# Patient Record
Sex: Female | Born: 1990 | Race: Black or African American | Hispanic: No | Marital: Single | Smoking: Current every day smoker
Health system: Southern US, Community
[De-identification: ages and names within clinical notes are randomized; demographics above are authoritative.]

## PROBLEM LIST (undated history)

## (undated) ENCOUNTER — Inpatient Hospital Stay (HOSPITAL_COMMUNITY): Payer: Self-pay

## (undated) ENCOUNTER — Ambulatory Visit: Payer: Medicaid Other

## (undated) DIAGNOSIS — O1490 Unspecified pre-eclampsia, unspecified trimester: Secondary | ICD-10-CM

## (undated) DIAGNOSIS — I10 Essential (primary) hypertension: Secondary | ICD-10-CM

## (undated) DIAGNOSIS — E119 Type 2 diabetes mellitus without complications: Secondary | ICD-10-CM

## (undated) DIAGNOSIS — O1410 Severe pre-eclampsia, unspecified trimester: Secondary | ICD-10-CM

## (undated) DIAGNOSIS — R7303 Prediabetes: Secondary | ICD-10-CM

## (undated) DIAGNOSIS — O24419 Gestational diabetes mellitus in pregnancy, unspecified control: Secondary | ICD-10-CM

## (undated) DIAGNOSIS — K3580 Unspecified acute appendicitis: Secondary | ICD-10-CM

## (undated) DIAGNOSIS — E669 Obesity, unspecified: Secondary | ICD-10-CM

## (undated) HISTORY — DX: Gestational diabetes mellitus in pregnancy, unspecified control: O24.419

## (undated) HISTORY — DX: Severe pre-eclampsia, unspecified trimester: O14.10

## (undated) HISTORY — DX: Type 2 diabetes mellitus without complications: E11.9

---
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
## 2011-04-03 ENCOUNTER — Emergency Department (HOSPITAL_COMMUNITY)
Admission: EM | Admit: 2011-04-03 | Discharge: 2011-04-03 | Disposition: A | Discharge status: Home / Self Care | Payer: Medicaid Other | Attending: Emergency Medicine | Admitting: Emergency Medicine

## 2011-04-03 ENCOUNTER — Encounter (HOSPITAL_COMMUNITY): Payer: Self-pay | Admitting: Emergency Medicine

## 2011-04-03 DIAGNOSIS — M79609 Pain in unspecified limb: Secondary | ICD-10-CM | POA: Insufficient documentation

## 2011-04-03 DIAGNOSIS — S40269A Insect bite (nonvenomous) of unspecified shoulder, initial encounter: Secondary | ICD-10-CM | POA: Insufficient documentation

## 2011-04-03 DIAGNOSIS — E119 Type 2 diabetes mellitus without complications: Secondary | ICD-10-CM | POA: Insufficient documentation

## 2011-04-03 DIAGNOSIS — W57XXXA Bitten or stung by nonvenomous insect and other nonvenomous arthropods, initial encounter: Secondary | ICD-10-CM | POA: Insufficient documentation

## 2011-04-03 DIAGNOSIS — Z79899 Other long term (current) drug therapy: Secondary | ICD-10-CM | POA: Insufficient documentation

## 2011-04-03 DIAGNOSIS — I10 Essential (primary) hypertension: Secondary | ICD-10-CM | POA: Insufficient documentation

## 2011-04-03 HISTORY — DX: Obesity, unspecified: E66.9

## 2011-04-03 HISTORY — DX: Essential (primary) hypertension: I10

## 2011-04-03 NOTE — ED Notes (Signed)
PT. REPORTS LEFT UPPER ARM TICK BITE 2 DAYS AGO , DENIES FEVER OR CHILLS , STATES "SORENESS" AT LEFT ARM .

## 2011-04-03 NOTE — ED Notes (Signed)
Pt stated understanding of discharge instructions.

## 2011-04-03 NOTE — ED Provider Notes (Signed)
History     CSN: [id number]  Arrival date & time 04/03/11  1914   First MD Initiated Contact with Patient 04/03/11 2223      Chief Complaint  Patient presents with  . Insect Bite    (Consider location/radiation/quality/duration/timing/severity/associated sxs/prior treatment) HPI Comments: 21 yo female presenting after removing a tick from her upper left arm/deltoid region.  She states she first noticed a bump on her arm two days ago which she thought was a mole.  Tonight, she noticed that it had grown and realized it was a tick.  Her friend removed the tick with tweezers, taking care to remove the head.  She has some mild tenderness at the site, but denies swelling, drainage, fevers, or redness.    The history is provided by the patient.    Past Medical History  Diagnosis Date  . Hypertension   . Diabetes mellitus   . Obesity     History reviewed. No pertinent past surgical history.  No family history on file.  History  Substance Use Topics  . Smoking status: Never Smoker   . Smokeless tobacco: Not on file  . Alcohol Use: No    OB History    Grav Para Term Preterm Abortions TAB SAB Ect Mult Living                  Review of Systems  Constitutional: Negative for fever.  HENT: Negative for congestion.   Respiratory: Negative for cough and shortness of breath.   Cardiovascular: Negative for chest pain.  Gastrointestinal: Negative for nausea, vomiting, abdominal pain and diarrhea.  Genitourinary: Negative for difficulty urinating.  Skin: Negative for rash.  All other systems reviewed and are negative.    Allergies  Review of patient's allergies indicates no known allergies.  Home Medications   Current Outpatient Rx  Name Route Sig Dispense Refill  . LISINOPRIL-HYDROCHLOROTHIAZIDE 20-12.5 MG PO TABS Oral Take 1 tablet by mouth daily.      Marland Kitchen METFORMIN HCL ER 750 MG PO TB24 Oral Take 750 mg by mouth daily with breakfast.        BP 149/101  Pulse 83   Temp(Src) 98.6 F (37 C) (Oral)  Resp 18  SpO2 99%  LMP 01/24/2011  Physical Exam  Nursing note and vitals reviewed. Constitutional: She is oriented to person, place, and time. She appears well-developed and well-nourished. No distress.  HENT:  Head: Normocephalic and atraumatic.  Mouth/Throat: Oropharynx is clear and moist.  Eyes: Conjunctivae are normal. Pupils are equal, round, and reactive to light. No scleral icterus.  Neck: Neck supple.  Cardiovascular: Normal rate, regular rhythm, normal heart sounds and intact distal pulses.   No murmur heard. Pulmonary/Chest: Effort normal and breath sounds normal. No stridor. No respiratory distress. She has no rales.  Abdominal: Soft. Bowel sounds are normal. She exhibits no distension. There is no tenderness.  Musculoskeletal: Normal range of motion.       Arms: Neurological: She is alert and oriented to person, place, and time.  Skin: Skin is warm and dry. No rash noted.  Psychiatric: She has a normal mood and affect. Her behavior is normal.    ED Course  Procedures (including critical care time)  Labs Reviewed - No data to display No results found.   1. Tick bite       MDM  Removed tick tonight, brought it to ED (head intact).  No fevers.  Well appearing.  No rash.  Lesion does not require any  intervention.  Low suspicion for RMSF or Lyme given time of year (January).  Have dc'd home with return precautions including fevers.          Warnell Forester, MD 04/04/11 [phone]

## 2011-04-04 NOTE — ED Provider Notes (Signed)
Seen under my supervision. I was immediately available for consultation  Doug Sou, MD 04/04/11 [phone]

## 2012-02-23 ENCOUNTER — Emergency Department (HOSPITAL_COMMUNITY)
Admission: EM | Admit: 2012-02-23 | Discharge: 2012-02-23 | Disposition: A | Discharge status: Home / Self Care | Payer: Medicaid Other | Attending: Emergency Medicine | Admitting: Emergency Medicine

## 2012-02-23 ENCOUNTER — Encounter (HOSPITAL_COMMUNITY): Payer: Self-pay | Admitting: Emergency Medicine

## 2012-02-23 DIAGNOSIS — R221 Localized swelling, mass and lump, neck: Secondary | ICD-10-CM | POA: Insufficient documentation

## 2012-02-23 DIAGNOSIS — E669 Obesity, unspecified: Secondary | ICD-10-CM | POA: Insufficient documentation

## 2012-02-23 DIAGNOSIS — I10 Essential (primary) hypertension: Secondary | ICD-10-CM | POA: Insufficient documentation

## 2012-02-23 DIAGNOSIS — E119 Type 2 diabetes mellitus without complications: Secondary | ICD-10-CM | POA: Insufficient documentation

## 2012-02-23 DIAGNOSIS — H6590 Unspecified nonsuppurative otitis media, unspecified ear: Secondary | ICD-10-CM

## 2012-02-23 DIAGNOSIS — Z79899 Other long term (current) drug therapy: Secondary | ICD-10-CM | POA: Insufficient documentation

## 2012-02-23 DIAGNOSIS — R220 Localized swelling, mass and lump, head: Secondary | ICD-10-CM | POA: Insufficient documentation

## 2012-02-23 DIAGNOSIS — H938X9 Other specified disorders of ear, unspecified ear: Secondary | ICD-10-CM | POA: Insufficient documentation

## 2012-02-23 DIAGNOSIS — J3489 Other specified disorders of nose and nasal sinuses: Secondary | ICD-10-CM | POA: Insufficient documentation

## 2012-02-23 MED ORDER — OXYMETAZOLINE HCL 0.05 % NA SOLN: 2.0000 | Freq: Two times a day (BID) | NASAL | Status: DC

## 2012-02-23 MED ORDER — PSEUDOEPHEDRINE HCL ER 120 MG PO TB12: 120.0000 mg | ORAL_TABLET | Freq: Two times a day (BID) | ORAL | Status: DC

## 2012-02-23 NOTE — ED Notes (Signed)
Patient currently sitting up in bed; no respiratory or acute distress noted.  Patient updated on plan of care; informed patient that we are currently waiting on discharge paperwork from [facility].  Patient denies any needs at this time; will continue to monitor. 

## 2012-02-23 NOTE — ED Notes (Signed)
Pt here with left ear pressure that is clogged with wax

## 2012-02-23 NOTE — ED Provider Notes (Signed)
History  Scribed for Gerhard Munch, MD, the patient was seen in room TR06C/TR06C. This chart was scribed by Candelaria Stagers. The patient's care started at 5:59 PM   CSN: [id number]  Arrival date & time 02/23/12  1738   First MD Initiated Contact with Patient 02/23/12 1753      Chief Complaint  Patient presents with  . Ear Fullness     The history is provided by the patient. No language interpreter was used.   Kayla Flynn is a 21 y.o. female who presents to the Emergency Department complaining of left ear fullness that started today.  She denies SOB, cough, rhinorrhea, or sore throat.  She is experiencing some congestion.  Nothing seems to make the sx better or worse.   Past Medical History  Diagnosis Date  . Hypertension   . Diabetes mellitus   . Obesity     History reviewed. No pertinent past surgical history.  History reviewed. No pertinent family history.  History  Substance Use Topics  . Smoking status: Never Smoker   . Smokeless tobacco: Not on file  . Alcohol Use: No    OB History    Grav Para Term Preterm Abortions TAB SAB Ect Mult Living                  Review of Systems  Constitutional: Negative for fever and chills.  HENT: Positive for congestion.        Ear fullness  Respiratory: Negative for cough.   Gastrointestinal: Negative for nausea and vomiting.    Allergies  Review of patient's allergies indicates no known allergies.  Home Medications   Current Outpatient Rx  Name  Route  Sig  Dispense  Refill  . BIOTIN 10 MG PO TABS   Oral   Take 1 tablet by mouth daily.         Marland Kitchen LISINOPRIL-HYDROCHLOROTHIAZIDE 20-12.5 MG PO TABS   Oral   Take 1 tablet by mouth daily.             BP 181/96  Pulse 77  Temp 99 F (37.2 C) (Oral)  Resp 18  SpO2 97%  Physical Exam  Nursing note and vitals reviewed. Constitutional: She is oriented to person, place, and time. She appears well-developed and well-nourished. No distress.  HENT:  Head:  Normocephalic and atraumatic.  Left Ear: No lacerations. No tenderness. No mastoid tenderness. Tympanic membrane is not injected, not perforated, not erythematous, not retracted and not bulging. A middle ear effusion is present. No hemotympanum.  Mouth/Throat: Uvula is midline, oropharynx is clear and moist and mucous membranes are normal.       Significant cerumen in L ear  Eyes: EOM are normal.  Neck: Neck supple. No tracheal deviation present.  Musculoskeletal: Normal range of motion.  Neurological: She is alert and oriented to person, place, and time.  Skin: Skin is warm and dry.  Psychiatric: She has a normal mood and affect. Her behavior is normal.    ED Course  Procedures  DIAGNOSTIC STUDIES: Oxygen Saturation is 97% on room air, normal by my interpretation.    COORDINATION OF CARE:     Labs Reviewed - No data to display No results found.   No diagnosis found.    MDM   I personally performed the services described in this documentation, which was scribed in my presence. The recorded information has been reviewed and is accurate.   This generally well-appearing young female now presents with concerns of left ear  pressure.  On exam she is afebrile, in no distress.  Patient's exam demonstrates a minor left ear effusion, as well as significant cerumen.  She was counseled on her current precautions, if this is an early infectious process, but without fever, distress, Discharge, there is no indication for the initiation of antibiotics.   Gerhard Munch, MD 02/23/12 1843

## 2012-02-23 NOTE — ED Notes (Signed)
Patient given copy of discharge paperwork; went over discharge instructions with patient.  Patient instructed to take prescriptions as directed, to follow up with primary care physician within one day, and to return to the ED for new, worsening, or concerning symptoms.

## 2012-07-16 ENCOUNTER — Encounter (HOSPITAL_COMMUNITY): Payer: Self-pay | Admitting: *Deleted

## 2012-07-16 DIAGNOSIS — R109 Unspecified abdominal pain: Principal | ICD-10-CM | POA: Insufficient documentation

## 2012-07-16 DIAGNOSIS — I10 Essential (primary) hypertension: Secondary | ICD-10-CM | POA: Insufficient documentation

## 2012-07-16 DIAGNOSIS — Z6841 Body Mass Index (BMI) 40.0 and over, adult: Secondary | ICD-10-CM | POA: Insufficient documentation

## 2012-07-16 DIAGNOSIS — K3580 Unspecified acute appendicitis: Secondary | ICD-10-CM | POA: Insufficient documentation

## 2012-07-16 DIAGNOSIS — E119 Type 2 diabetes mellitus without complications: Secondary | ICD-10-CM | POA: Insufficient documentation

## 2012-07-16 DIAGNOSIS — K429 Umbilical hernia without obstruction or gangrene: Secondary | ICD-10-CM | POA: Insufficient documentation

## 2012-07-16 LAB — COMPREHENSIVE METABOLIC PANEL
ALT: 22 U/L (ref 0–35)
AST: 21 U/L (ref 0–37)
Alkaline Phosphatase: 79 U/L (ref 39–117)
CO2: 26 mEq/L (ref 19–32)
Chloride: 102 mEq/L (ref 96–112)
GFR calc non Af Amer: >90 mL/min (ref >90)
Sodium: 139 mEq/L (ref 135–145)
Total Bilirubin: 0.3 mg/dL (ref 0.3–1.2)

## 2012-07-16 LAB — CBC WITH DIFFERENTIAL/PLATELET
Basophils Absolute: 0 10*3/uL (ref 0.0–0.1)
HCT: 35.3 % — ABNORMAL LOW (ref 36.0–46.0)
Lymphocytes Relative: 24 % (ref 12–46)
Monocytes Absolute: 1.1 10*3/uL — ABNORMAL HIGH (ref 0.1–1.0)
Neutro Abs: 9.1 10*3/uL — ABNORMAL HIGH (ref 1.7–7.7)
Platelets: 380 10*3/uL (ref 150–400)
RDW: 12.9 % (ref 11.5–15.5)
WBC: 13.4 10*3/uL — ABNORMAL HIGH (ref 4.0–10.5)

## 2012-07-16 LAB — CG4 I-STAT (LACTIC ACID): Lactic Acid, Venous: 0.75 mmol/L (ref 0.5–2.2)

## 2012-07-16 NOTE — ED Notes (Signed)
Lab called and stated that pt urine was in the bag and needs more urine

## 2012-07-16 NOTE — ED Notes (Signed)
Pt states lower abdominal pain for a week. Pt denies N/V/D. Pt denies problems with urine. Pt states that the pain is making it harder to stand up straight.

## 2012-07-17 ENCOUNTER — Emergency Department (HOSPITAL_COMMUNITY): Payer: Medicaid Other

## 2012-07-17 ENCOUNTER — Encounter (HOSPITAL_COMMUNITY): Payer: Self-pay | Admitting: Certified Registered"

## 2012-07-17 ENCOUNTER — Observation Stay (HOSPITAL_COMMUNITY): Payer: Medicaid Other | Admitting: Anesthesiology

## 2012-07-17 ENCOUNTER — Encounter (HOSPITAL_COMMUNITY): Payer: Self-pay | Admitting: Anesthesiology

## 2012-07-17 ENCOUNTER — Encounter (HOSPITAL_COMMUNITY)
Admission: EM | Disposition: A | Discharge status: Home / Self Care | Payer: Self-pay | Source: Home / Self Care | Attending: Emergency Medicine

## 2012-07-17 ENCOUNTER — Inpatient Hospital Stay: Admit: 2012-07-17 | Payer: Self-pay | Admitting: General Surgery

## 2012-07-17 ENCOUNTER — Observation Stay (HOSPITAL_COMMUNITY)
Admission: EM | Admit: 2012-07-17 | Discharge: 2012-07-18 | Disposition: A | Discharge status: Home / Self Care | Payer: Medicaid Other | Attending: General Surgery | Admitting: General Surgery

## 2012-07-17 DIAGNOSIS — R1031 Right lower quadrant pain: Secondary | ICD-10-CM

## 2012-07-17 DIAGNOSIS — K37 Unspecified appendicitis: Secondary | ICD-10-CM

## 2012-07-17 DIAGNOSIS — K3580 Unspecified acute appendicitis: Secondary | ICD-10-CM | POA: Diagnosis present

## 2012-07-17 HISTORY — PX: LAPAROSCOPIC APPENDECTOMY: SHX408

## 2012-07-17 HISTORY — DX: Morbid (severe) obesity due to excess calories: E66.01

## 2012-07-17 HISTORY — PX: APPENDECTOMY: SHX54

## 2012-07-17 HISTORY — DX: Unspecified acute appendicitis: K35.80

## 2012-07-17 HISTORY — DX: Prediabetes: R73.03

## 2012-07-17 LAB — URINALYSIS, ROUTINE W REFLEX MICROSCOPIC
Glucose, UA: NEGATIVE mg/dL
Ketones, ur: NEGATIVE mg/dL
Protein, ur: NEGATIVE mg/dL

## 2012-07-17 LAB — CBC
MCH: 28.9 pg (ref 26.0–34.0)
MCHC: 32.6 g/dL (ref 30.0–36.0)
Platelets: 344 10*3/uL (ref 150–400)
RBC: 3.67 MIL/uL — ABNORMAL LOW (ref 3.87–5.11)

## 2012-07-17 LAB — GLUCOSE, CAPILLARY
Glucose-Capillary: 87 mg/dL (ref 70–99)
Glucose-Capillary: 113 mg/dL — ABNORMAL HIGH (ref 70–99)

## 2012-07-17 LAB — WET PREP, GENITAL: Clue Cells Wet Prep HPF POC: NONE SEEN

## 2012-07-17 LAB — URINE MICROSCOPIC-ADD ON

## 2012-07-17 LAB — CREATININE, SERUM: Creatinine, Ser: 0.8 mg/dL (ref 0.50–1.10)

## 2012-07-17 SURGERY — APPENDECTOMY, LAPAROSCOPIC
Anesthesia: General | Site: Abdomen | Wound class: Contaminated

## 2012-07-17 MED ORDER — IOHEXOL 300 MG/ML  SOLN
100.0000 mL | Freq: Once | INTRAMUSCULAR | Status: AC | PRN
  Administered 2012-07-17: 100 mL via INTRAVENOUS

## 2012-07-17 MED ORDER — OXYCODONE HCL 5 MG PO TABS: 5.0000 mg | ORAL_TABLET | Freq: Once | ORAL | Status: AC | PRN

## 2012-07-17 MED ORDER — SUCCINYLCHOLINE CHLORIDE 20 MG/ML IJ SOLN
INTRAMUSCULAR | Status: DC | PRN
  Administered 2012-07-17: 100 mg via INTRAVENOUS

## 2012-07-17 MED ORDER — SODIUM CHLORIDE 0.9 % IV SOLN
INTRAVENOUS | Status: DC
  Administered 2012-07-17 – 2012-07-18 (×3): via INTRAVENOUS

## 2012-07-17 MED ORDER — MIDAZOLAM HCL 5 MG/5ML IJ SOLN
INTRAMUSCULAR | Status: DC | PRN
  Administered 2012-07-17: 2 mg via INTRAVENOUS

## 2012-07-17 MED ORDER — OXYCODONE HCL 5 MG PO TABS
5.0000 mg | ORAL_TABLET | ORAL | Status: DC | PRN
  Administered 2012-07-17 – 2012-07-18 (×2): 10 mg via ORAL
  Filled 2012-07-17 (×2): qty 2

## 2012-07-17 MED ORDER — MORPHINE SULFATE 4 MG/ML IJ SOLN
4.0000 mg | Freq: Once | INTRAMUSCULAR | Status: AC
  Administered 2012-07-17: 4 mg via INTRAVENOUS
  Filled 2012-07-17: qty 1

## 2012-07-17 MED ORDER — GLYCOPYRROLATE 0.2 MG/ML IJ SOLN
INTRAMUSCULAR | Status: DC | PRN
  Administered 2012-07-17: 0.4 mg via INTRAVENOUS

## 2012-07-17 MED ORDER — HYDROMORPHONE HCL PF 1 MG/ML IJ SOLN
0.2500 mg | INTRAMUSCULAR | Status: DC | PRN
  Administered 2012-07-17: 0.5 mg via INTRAVENOUS
  Filled 2012-07-17: qty 1

## 2012-07-17 MED ORDER — PROPOFOL 10 MG/ML IV BOLUS
INTRAVENOUS | Status: DC | PRN
Start: 2012-07-17 — End: 2012-07-17
  Administered 2012-07-17: 200 mg via INTRAVENOUS

## 2012-07-17 MED ORDER — SODIUM CHLORIDE 0.9 % IV SOLN: INTRAVENOUS | Status: DC

## 2012-07-17 MED ORDER — ONDANSETRON HCL 4 MG/2ML IJ SOLN: 4.0000 mg | Freq: Four times a day (QID) | INTRAMUSCULAR | Status: DC | PRN

## 2012-07-17 MED ORDER — BUPIVACAINE-EPINEPHRINE 0.25% -1:200000 IJ SOLN
INTRAMUSCULAR | Status: DC | PRN
  Administered 2012-07-17: 1 mL

## 2012-07-17 MED ORDER — OXYCODONE HCL 5 MG PO TABS
ORAL_TABLET | ORAL | Status: AC
  Administered 2012-07-17: 5 mg via ORAL
  Filled 2012-07-17: qty 1

## 2012-07-17 MED ORDER — LACTATED RINGERS IV SOLN
INTRAVENOUS | Status: DC
  Administered 2012-07-17: 10:00:00 via INTRAVENOUS

## 2012-07-17 MED ORDER — SODIUM CHLORIDE 0.9 % IR SOLN
Status: DC | PRN
  Administered 2012-07-17: 1000 mL

## 2012-07-17 MED ORDER — HEPARIN SODIUM (PORCINE) 5000 UNIT/ML IJ SOLN
5000.0000 [IU] | Freq: Three times a day (TID) | INTRAMUSCULAR | Status: DC
  Administered 2012-07-17 – 2012-07-18 (×2): 5000 [IU] via SUBCUTANEOUS
  Filled 2012-07-17 (×5): qty 1

## 2012-07-17 MED ORDER — IOHEXOL 300 MG/ML  SOLN
50.0000 mL | Freq: Once | INTRAMUSCULAR | Status: AC | PRN
  Administered 2012-07-17: 50 mL via ORAL

## 2012-07-17 MED ORDER — NEOSTIGMINE METHYLSULFATE 1 MG/ML IJ SOLN
INTRAMUSCULAR | Status: DC | PRN
  Administered 2012-07-17: 3 mg via INTRAVENOUS

## 2012-07-17 MED ORDER — HYDROMORPHONE HCL PF 1 MG/ML IJ SOLN
INTRAMUSCULAR | Status: AC
  Administered 2012-07-17: 0.5 mg via INTRAVENOUS
  Filled 2012-07-17: qty 1

## 2012-07-17 MED ORDER — MORPHINE SULFATE 2 MG/ML IJ SOLN: 2.0000 mg | INTRAMUSCULAR | Status: DC | PRN

## 2012-07-17 MED ORDER — LIDOCAINE HCL (CARDIAC) 20 MG/ML IV SOLN
INTRAVENOUS | Status: DC | PRN
  Administered 2012-07-17: 100 mg via INTRAVENOUS

## 2012-07-17 MED ORDER — PIPERACILLIN-TAZOBACTAM 3.375 G IVPB
3.3750 g | Freq: Three times a day (TID) | INTRAVENOUS | Status: DC
  Administered 2012-07-17: 3.375 g via INTRAVENOUS
  Filled 2012-07-17 (×4): qty 50

## 2012-07-17 MED ORDER — ONDANSETRON HCL 4 MG/2ML IJ SOLN
INTRAMUSCULAR | Status: DC | PRN
  Administered 2012-07-17: 4 mg via INTRAVENOUS

## 2012-07-17 MED ORDER — OXYCODONE HCL 5 MG/5ML PO SOLN: 5.0000 mg | Freq: Once | ORAL | Status: AC | PRN

## 2012-07-17 MED ORDER — PIPERACILLIN-TAZOBACTAM 3.375 G IVPB
3.3750 g | Freq: Three times a day (TID) | INTRAVENOUS | Status: AC
  Administered 2012-07-17 – 2012-07-18 (×2): 3.375 g via INTRAVENOUS
  Filled 2012-07-17 (×2): qty 50

## 2012-07-17 MED ORDER — FENTANYL CITRATE 0.05 MG/ML IJ SOLN
INTRAMUSCULAR | Status: DC | PRN
  Administered 2012-07-17 (×2): 50 ug via INTRAVENOUS
  Administered 2012-07-17: 150 ug via INTRAVENOUS

## 2012-07-17 MED ORDER — 0.9 % SODIUM CHLORIDE (POUR BTL) OPTIME
TOPICAL | Status: DC | PRN
  Administered 2012-07-17: 1000 mL

## 2012-07-17 MED ORDER — ROCURONIUM BROMIDE 100 MG/10ML IV SOLN
INTRAVENOUS | Status: DC | PRN
  Administered 2012-07-17: 10 mg via INTRAVENOUS
  Administered 2012-07-17: 40 mg via INTRAVENOUS

## 2012-07-17 MED ORDER — PROMETHAZINE HCL 25 MG/ML IJ SOLN: 6.2500 mg | INTRAMUSCULAR | Status: DC | PRN

## 2012-07-17 SURGICAL SUPPLY — 54 items
APPLIER CLIP ROT 10 11.4 M/L (STAPLE)
BLADE SURG ROTATE 9660 (MISCELLANEOUS) IMPLANT
CANISTER SUCTION 2500CC (MISCELLANEOUS) ×2 IMPLANT
CHLORAPREP W/TINT 26ML (MISCELLANEOUS) ×4 IMPLANT
CLIP APPLIE ROT 10 11.4 M/L (STAPLE) IMPLANT
CLOTH BEACON ORANGE TIMEOUT ST (SAFETY) ×2 IMPLANT
COVER SURGICAL LIGHT HANDLE (MISCELLANEOUS) ×2 IMPLANT
CUTTER LINEAR ENDO 35 ETS (STAPLE) IMPLANT
CUTTER LINEAR ENDO 35 ETS TH (STAPLE) ×2 IMPLANT
DECANTER SPIKE VIAL GLASS SM (MISCELLANEOUS) ×2 IMPLANT
DERMABOND ADVANCED (GAUZE/BANDAGES/DRESSINGS) ×1
DERMABOND ADVANCED .7 DNX12 (GAUZE/BANDAGES/DRESSINGS) ×1 IMPLANT
DRAPE UTILITY 15X26 W/TAPE STR (DRAPE) ×6 IMPLANT
ELECT REM PT RETURN 9FT ADLT (ELECTROSURGICAL) ×2
ELECTRODE REM PT RTRN 9FT ADLT (ELECTROSURGICAL) ×1 IMPLANT
ENDOLOOP SUT PDS II  0 18 (SUTURE)
ENDOLOOP SUT PDS II 0 18 (SUTURE) IMPLANT
GLOVE BIO SURGEON STRL SZ7 (GLOVE) ×2 IMPLANT
GLOVE BIO SURGEON STRL SZ7.5 (GLOVE) ×2 IMPLANT
GLOVE BIO SURGEON STRL SZ8 (GLOVE) ×2 IMPLANT
GLOVE BIOGEL PI IND STRL 6.5 (GLOVE) ×1 IMPLANT
GLOVE BIOGEL PI IND STRL 7.0 (GLOVE) ×3 IMPLANT
GLOVE BIOGEL PI IND STRL 7.5 (GLOVE) ×1 IMPLANT
GLOVE BIOGEL PI IND STRL 8 (GLOVE) ×1 IMPLANT
GLOVE BIOGEL PI INDICATOR 6.5 (GLOVE) ×1
GLOVE BIOGEL PI INDICATOR 7.0 (GLOVE) ×3
GLOVE BIOGEL PI INDICATOR 7.5 (GLOVE) ×1
GLOVE BIOGEL PI INDICATOR 8 (GLOVE) ×1
GLOVE SURG SS PI 7.0 STRL IVOR (GLOVE) ×4 IMPLANT
GOWN PREVENTION PLUS XLARGE (GOWN DISPOSABLE) ×2 IMPLANT
GOWN STRL NON-REIN LRG LVL3 (GOWN DISPOSABLE) ×8 IMPLANT
KIT BASIN OR (CUSTOM PROCEDURE TRAY) ×2 IMPLANT
KIT ROOM TURNOVER OR (KITS) ×2 IMPLANT
NS IRRIG 1000ML POUR BTL (IV SOLUTION) ×2 IMPLANT
PAD ARMBOARD 7.5X6 YLW CONV (MISCELLANEOUS) ×4 IMPLANT
POUCH SPECIMEN RETRIEVAL 10MM (ENDOMECHANICALS) ×2 IMPLANT
RELOAD /EVU35 (ENDOMECHANICALS) IMPLANT
RELOAD CUTTER ETS 35MM STAND (ENDOMECHANICALS) IMPLANT
SCALPEL HARMONIC ACE (MISCELLANEOUS) ×2 IMPLANT
SET IRRIG TUBING LAPAROSCOPIC (IRRIGATION / IRRIGATOR) ×2 IMPLANT
SPECIMEN JAR SMALL (MISCELLANEOUS) ×2 IMPLANT
SUT VIC AB 4-0 PS2 27 (SUTURE) ×2 IMPLANT
SWAB COLLECTION DEVICE MRSA (MISCELLANEOUS) IMPLANT
TOWEL OR 17X24 6PK STRL BLUE (TOWEL DISPOSABLE) ×2 IMPLANT
TOWEL OR 17X26 10 PK STRL BLUE (TOWEL DISPOSABLE) ×2 IMPLANT
TRAY FOLEY CATH 14FR (SET/KITS/TRAYS/PACK) ×2 IMPLANT
TRAY LAPAROSCOPIC (CUSTOM PROCEDURE TRAY) ×2 IMPLANT
TROCAR 12M 150ML BLUNT (TROCAR) ×2 IMPLANT
TROCAR 5M 150ML BLDLS (TROCAR) ×4 IMPLANT
TROCAR XCEL 12X100 BLDLESS (ENDOMECHANICALS) IMPLANT
TROCAR XCEL BLUNT TIP 100MML (ENDOMECHANICALS) ×2 IMPLANT
TROCAR XCEL NON-BLD 5MMX100MML (ENDOMECHANICALS) IMPLANT
TUBE ANAEROBIC SPECIMEN COL (MISCELLANEOUS) IMPLANT
WATER STERILE IRR 1000ML POUR (IV SOLUTION) IMPLANT

## 2012-07-17 NOTE — Op Note (Signed)
07/17/2012  12:36 PM  PATIENT:  Kayla Flynn  21 y.o. female  PRE-OPERATIVE DIAGNOSIS:  APPENDICITIS  POST-OPERATIVE DIAGNOSIS:  APPENDICITIS  PROCEDURE:  Procedure(s): APPENDECTOMY LAPAROSCOPIC  SURGEON:  Surgeon(s): Liz Malady, MD  PHYSICIAN ASSISTANT:   ASSISTANTS: none   ANESTHESIA:   local and general  EBL:  Total I/O In: 600 [I.V.:600] Out: 200 [Urine:200]  BLOOD ADMINISTERED:none  DRAINS: none   SPECIMEN:  Excision  DISPOSITION OF SPECIMEN:  PATHOLOGY  COUNTS:  YES  DICTATION: .Dragon Dictation   Findings: Acute, nonperforated appendicitis  Patient present for appendectomy. She was identified in the preop holding area. Informed consent was obtained. She is receiving intravenous antibiotics. She was brought to the operating room. General endotracheal anesthesia was administered by the anesthesia staff. Foley catheter was placed by nursing. Abdomen was prepped and draped in sterile fashion. Time out procedure was done. Infraumbilical incision area was infiltrated with local anesthetic. Infraumbilical incision was made. Subcutaneous tissues were dissected down revealing the anterior fascia. This was divided along the midline.Peritoneal cavity was entered under direct vision carefully. 0 Vicryl pursestring suture was placed on the fascial opening. Hassan trocar was inserted. Abdomen was insufflated with carbon dioxide in standard fashion. Under direct vision, a 12 mm left lower quadrant port and a 5 mm mid abdomen port were placed. Local was used at each port site. Laparoscopic exploration revealed inflammatory process of the cecum. The appendix was located extending laterally. It was freed up from some lateral peritoneal attachments. The mesoappendix was divided with harmonic scalpel achieving excellent hemostasis. This of the appendix was intact. The appendix itself was not perforated. The base was divided with Endo GIA with vascular load. Appendix was placed in an  Endo Catch bag and removed from the left lower quadrant port site. Abdomen was copiously irrigated. Irrigation fluid returned clear. Staple line remained intact. There was no bleeding. Ports were removed under direct vision. Pneumoperitoneum was released. 0 Vicryl pursestring suture was tied to close the fascial defect in the infraumbilical region without trapping any intra-abdominal contents. All 3 wounds were copiously irrigated. Skin of each was closed with running 4 Vicryl followed by Dermabond. All counts were correct. Patient tolerated procedure well without apparent complications and was taken recovery in stable condition.  PATIENT DISPOSITION:  PACU - hemodynamically stable.   Delay start of Pharmacological VTE agent (>24hrs) due to surgical blood loss or risk of bleeding:  no  Violeta Gelinas, MD, MPH, FACS Pager: [phone]  5/13/201412:36 PM

## 2012-07-17 NOTE — Interval H&P Note (Signed)
History and Physical Interval Note:  07/17/2012 11:03 AM  Kayla Flynn  has presented today for surgery, with the diagnosis of appendicitis  The various methods of treatment have been discussed with the patient and family. After consideration of risks, benefits and other options for treatment, the patient has consented to  Procedure(s): APPENDECTOMY LAPAROSCOPIC (N/A) as a surgical intervention .  The patient's history has been reviewed, patient examined by myself, no change in status, stable for surgery.  I have reviewed the patient's chart and labs.  Questions were answered to the patient's satisfaction.     Luby Seamans E

## 2012-07-17 NOTE — Transfer of Care (Signed)
Immediate Anesthesia Transfer of Care Note  Patient: Kayla Flynn  Procedure(s) Performed: Procedure(s): APPENDECTOMY LAPAROSCOPIC (N/A)  Patient Location: PACU  Anesthesia Type:General  Level of Consciousness: awake, alert , oriented and patient cooperative  Airway & Oxygen Therapy: Patient Spontanous Breathing and Patient connected to face mask oxygen  Post-op Assessment: Report given to PACU RN, Post -op Vital signs reviewed and stable and Patient moving all extremities  Post vital signs: Reviewed and stable  Complications: No apparent anesthesia complications

## 2012-07-17 NOTE — ED Provider Notes (Signed)
Medical screening examination/treatment/procedure(s) were performed by non-physician practitioner and as supervising physician I was immediately available for consultation/collaboration.  Noam Franzen M Myishia Kasik, MD 07/17/12 1817 

## 2012-07-17 NOTE — ED Notes (Signed)
Pt sleeping awaiting call from or

## 2012-07-17 NOTE — ED Notes (Signed)
Patient signed consent for surgery. Patient states she understood the procedure the surgeon discussed and had no further questions.

## 2012-07-17 NOTE — ED Notes (Signed)
Patient has finished CT contrast. CT notified.

## 2012-07-17 NOTE — Anesthesia Preprocedure Evaluation (Signed)
Anesthesia Evaluation    Reviewed: Allergy & Precautions, H&P , NPO status , Patient's Chart, lab work & pertinent test results  Airway       Dental   Pulmonary neg pulmonary ROS,          Cardiovascular hypertension, Pt. on medications     Neuro/Psych negative neurological ROS  negative psych ROS   GI/Hepatic negative GI ROS, Neg liver ROS,   Endo/Other  diabetesMorbid obesity  Renal/GU negative Renal ROS     Musculoskeletal   Abdominal   Peds  Hematology   Anesthesia Other Findings   Reproductive/Obstetrics                           Anesthesia Physical Anesthesia Plan  ASA: III  Anesthesia Plan: General   Post-op Pain Management:    Induction: Intravenous, Rapid sequence and Cricoid pressure planned  Airway Management Planned: Oral ETT  Additional Equipment:   Intra-op Plan:   Post-operative Plan: Extubation in OR  Informed Consent:   Plan Discussed with: CRNA, Anesthesiologist and Surgeon  Anesthesia Plan Comments:         Anesthesia Quick Evaluation

## 2012-07-17 NOTE — Anesthesia Procedure Notes (Signed)
Procedure Name: Intubation Date/Time: 07/17/2012 11:37 AM Performed by: Jerilee Hoh Pre-anesthesia Checklist: Patient identified, Emergency Drugs available, Suction available and Patient being monitored Patient Re-evaluated:Patient Re-evaluated prior to inductionOxygen Delivery Method: Circle system utilized Preoxygenation: Pre-oxygenation with 100% oxygen Intubation Type: IV induction, Rapid sequence and Cricoid Pressure applied Laryngoscope Size: Mac and 3 Grade View: Grade I Tube type: Oral Tube size: 7.0 mm Number of attempts: 1 Airway Equipment and Method: Stylet Placement Confirmation: ETT inserted through vocal cords under direct vision,  positive ETCO2 and breath sounds checked- equal and bilateral Secured at: 21 cm Tube secured with: Tape Dental Injury: Teeth and Oropharynx as per pre-operative assessment

## 2012-07-17 NOTE — Progress Notes (Signed)
ANTIBIOTIC CONSULT NOTE - INITIAL  Pharmacy Consult:  Zosyn Indication:  Empiric   No Known Allergies  Patient Measurements: Height: 4' 11.06" (150 cm) Weight: 339 lb 8.1 oz (154 kg) IBW/kg (Calculated) : 43.33  Vital Signs: Temp: 99.3 F (37.4 C) (05/13 0623) Temp src: Oral (05/13 0623) BP: 142/62 mmHg (05/13 0623) Pulse Rate: 97 (05/13 0623)  Labs:  Recent Labs  07/16/12 2159  WBC 13.4*  HGB 11.8*  PLT 380  CREATININE 0.85   Estimated Creatinine Clearance: 144.8 ml/min (by C-G formula based on Cr of 0.85). No results found for this basename: VANCOTROUGH, VANCOPEAK, VANCORANDOM, GENTTROUGH, GENTPEAK, GENTRANDOM, TOBRATROUGH, TOBRAPEAK, TOBRARND, AMIKACINPEAK, AMIKACINTROU, AMIKACIN,  in the last 72 hours   Microbiology: Recent Results (from the past 720 hour(s))  WET PREP, GENITAL     Status: Abnormal   Collection Time    07/17/12  5:01 AM      Result Value Range Status   Yeast Wet Prep HPF POC NONE SEEN  NONE SEEN Final   Trich, Wet Prep NONE SEEN  NONE SEEN Final   Clue Cells Wet Prep HPF POC NONE SEEN  NONE SEEN Final   WBC, Wet Prep HPF POC FEW (*) NONE SEEN Final    Medical History: Past Medical History  Diagnosis Date  . Hypertension   . Diabetes mellitus   . Obesity        Assessment: 40 YOF admitted with lower abdominal pain x 1 week.  CT shows patient with early/mild tip appendicitis.  Pharmacy consulted to manage Zosyn as empiric therapy.  She has excellent renal clearance.   Goal of Therapy:  Infection prevention   Plan:  - Zosyn 3.375gm IV Q8H, 4 hr infusion - Monitor renal fxn, clinical progress, abx LOT - F/U updated height and weight     Patra Gherardi D. Laney Potash, PharmD, BCPS Pager:  [phone] 07/17/2012, 6:53 AM

## 2012-07-17 NOTE — ED Provider Notes (Signed)
History     CSN: [id number]  Arrival date & time 07/16/12  2151   First MD Initiated Contact with Patient 07/17/12 0220      Chief Complaint  Patient presents with  . Abdominal Pain    (Consider location/radiation/quality/duration/timing/severity/associated sxs/prior treatment) HPI History provided by pt.   Pt has had constant, diffuse abdominal cramping, worst across lower and right-side of abdomen x 1 week.  Associated w/ anorexia, mild nausea and loose stool.  No known fever and denies urinary and vaginal symptoms.  H/o HTN and prediabetes, both of which improved w/ lifestyle changes.  No h/o abdominal surgeries.   Past Medical History  Diagnosis Date  . Hypertension   . Diabetes mellitus   . Obesity     History reviewed. No pertinent past surgical history.  History reviewed. No pertinent family history.  History  Substance Use Topics  . Smoking status: Never Smoker   . Smokeless tobacco: Not on file  . Alcohol Use: No    OB History   Grav Para Term Preterm Abortions TAB SAB Ect Mult Living                  Review of Systems  Allergies  Review of patient's allergies indicates no known allergies.  Home Medications   Current Outpatient Rx  Name  Route  Sig  Dispense  Refill  . Biotin 10 MG TABS   Oral   Take 1 tablet by mouth daily.           BP 137/71  Pulse 92  Temp(Src) 100.2 F (37.9 C) (Oral)  Resp 20  SpO2 100%  Physical Exam  Nursing note and vitals reviewed. Constitutional: She is oriented to person, place, and time. She appears well-developed and well-nourished. No distress.  Morbidly obese  HENT:  Head: Normocephalic and atraumatic.  Eyes:  Normal appearance  Neck: Normal range of motion.  Cardiovascular: Normal rate and regular rhythm.   Pulmonary/Chest: Effort normal and breath sounds normal. No respiratory distress.  Abdominal: Soft. Bowel sounds are normal. She exhibits no distension and no mass. There is no rebound and no  guarding.  Exam limited d/t body habitus, but diffuse tenderness, worst at right lower and upper quadrants.   Genitourinary:  No CVA tenderness  Musculoskeletal: Normal range of motion.  Neurological: She is alert and oriented to person, place, and time.  Skin: Skin is warm and dry. No rash noted.  Psychiatric: She has a normal mood and affect. Her behavior is normal.    ED Course  Procedures (including critical care time)  Labs Reviewed  WET PREP, GENITAL - Abnormal; Notable for the following:    WBC, Wet Prep HPF POC FEW (*)    All other components within normal limits  CBC WITH DIFFERENTIAL - Abnormal; Notable for the following:    WBC 13.4 (*)    Hemoglobin 11.8 (*)    HCT 35.3 (*)    Neutro Abs 9.1 (*)    Monocytes Absolute 1.1 (*)    All other components within normal limits  COMPREHENSIVE METABOLIC PANEL - Abnormal; Notable for the following:    Glucose, Bld 104 (*)    All other components within normal limits  URINALYSIS, ROUTINE W REFLEX MICROSCOPIC - Abnormal; Notable for the following:    Color, Urine AMBER (*)    APPearance CLOUDY (*)    Hgb urine dipstick SMALL (*)    Leukocytes, UA SMALL (*)    All other components within normal  limits  URINE MICROSCOPIC-ADD ON - Abnormal; Notable for the following:    Squamous Epithelial / LPF FEW (*)    Bacteria, UA FEW (*)    All other components within normal limits  GC/CHLAMYDIA PROBE AMP  URINE CULTURE  POCT PREGNANCY, URINE  CG4 I-STAT (LACTIC ACID)   Ct Abdomen Pelvis W Contrast  07/17/2012  *RADIOLOGY REPORT*  Clinical Data: Lower abdominal pain  CT ABDOMEN AND PELVIS WITH CONTRAST  Technique:  Multidetector CT imaging of the abdomen and pelvis was performed following the standard protocol during bolus administration of intravenous contrast.  Contrast: OMNIPAQUE IOHEXOL 300 MG/ML  SOLN  Comparison: 08/21/2006  Findings: Lung bases are clear.  Heart size within normal limits.  Unremarkable liver, spleen,  pancreas, biliary system, adrenal glands, kidneys.  No hydronephrosis or hydroureter.  No CT evidence for colitis.  The appendix is normal in diameter and air filled proximally, however, with mild fluid distension at the tip, measuring up to 7-8 mm in diameter as seen on coronal image 59.  There is mild adjacent stranding / fluid along the right paracolic gutter which may arise from the appendix or track superiorly from the ovaries.  Small bowel loops are normal course and caliber.  No free intraperitoneal air.  No lymphadenopathy.  Normal caliber vasculature.  Fat containing umbilical hernia.  Thin-walled bladder.  Unremarkable CT appearance to the uterus and adnexa.  SI DJD.  No acute osseous finding.  IMPRESSION: Findings are equivocal for early/mild tip appendicitis as above.   Original Report Authenticated By: Jearld Lesch, M.D.      1. Appendicitis       MDM  22yo morbidly obese female, otherwise healthy, presents w/ abd pain.  Suspect appendicitis vs. Cholelithiasis at this time, but clinical picture more concerning for appendicitis.  U/A, pelvic exam and CT abd/pelvis pending.  Pt to receive IV morphine.  2:58 AM    Unremarkable genitalia.  CT shows early appendicitis.  Results discussed w/ pt and her mother.  Her pain is improved w/ morphine.  Dr. Dwain Sarna consulted for admission.  5:23 AM     Otilio Miu, PA-C 07/17/12 [phone]

## 2012-07-17 NOTE — Preoperative (Signed)
Beta Blockers   Reason not to administer Beta Blockers:Not Applicable 

## 2012-07-17 NOTE — H&P (Signed)
Kayla Flynn is an 22 y.o. female.   Chief Complaint: abdominal pain, ct with possible appendicitis HPI: 47 yof who presents with what she says is one week of some vague abdominal pain and points to her right side but this is mostly in her RLQ.  This has gotten much worse over the last week and she was not able to tolerate anymore. Nothing was relieving it.  Deep breaths, movement, palpation aggravate it.  She says is having subjective fevers and chills.  Some nausea, no emesis.  Having normal bms.  She has anorexia. She had last period about 5 weeks ago and is irregular.  She does not note any gyn symptoms.  No urinary symptoms  Past Medical History  Diagnosis Date  . Hypertension   . Diabetes mellitus   . Obesity     History reviewed. No pertinent past surgical history.  History reviewed. No pertinent family history. Social History:  reports that she has never smoked. She does not have any smokeless tobacco history on file. She reports that she does not drink alcohol or use illicit drugs.  Allergies: No Known Allergies  Meds biotin  Results for orders placed during the hospital encounter of 07/17/12 (from the past 48 hour(s))  CBC WITH DIFFERENTIAL     Status: Abnormal   Collection Time    07/16/12  9:59 PM      Result Value Range   WBC 13.4 (*) 4.0 - 10.5 K/uL   RBC 3.98  3.87 - 5.11 MIL/uL   Hemoglobin 11.8 (*) 12.0 - 15.0 g/dL   HCT 16.1 (*) 09.6 - 04.5 %   MCV 88.7  78.0 - 100.0 fL   MCH 29.6  26.0 - 34.0 pg   MCHC 33.4  30.0 - 36.0 g/dL   RDW 40.9  81.1 - 91.4 %   Platelets 380  150 - 400 K/uL   Neutrophils Relative 68  43 - 77 %   Neutro Abs 9.1 (*) 1.7 - 7.7 K/uL   Lymphocytes Relative 24  12 - 46 %   Lymphs Abs 3.2  0.7 - 4.0 K/uL   Monocytes Relative 8  3 - 12 %   Monocytes Absolute 1.1 (*) 0.1 - 1.0 K/uL   Eosinophils Relative 1  0 - 5 %   Eosinophils Absolute 0.1  0.0 - 0.7 K/uL   Basophils Relative 0  0 - 1 %   Basophils Absolute 0.0  0.0 - 0.1 K/uL   COMPREHENSIVE METABOLIC PANEL     Status: Abnormal   Collection Time    07/16/12  9:59 PM      Result Value Range   Sodium 139  135 - 145 mEq/L   Potassium 3.9  3.5 - 5.1 mEq/L   Chloride 102  96 - 112 mEq/L   CO2 26  19 - 32 mEq/L   Glucose, Bld 104 (*) 70 - 99 mg/dL   BUN 11  6 - 23 mg/dL   Creatinine, Ser 7.82  0.50 - 1.10 mg/dL   Calcium 95.6  8.4 - 21.3 mg/dL   Total Protein 8.2  6.0 - 8.3 g/dL   Albumin 3.9  3.5 - 5.2 g/dL   AST 21  0 - 37 U/L   ALT 22  0 - 35 U/L   Alkaline Phosphatase 79  39 - 117 U/L   Total Bilirubin 0.3  0.3 - 1.2 mg/dL   GFR calc non Af Amer >90  >90 mL/min   GFR calc Af  Amer >90  >90 mL/min   Comment:            The eGFR has been calculated     using the CKD EPI equation.     This calculation has not been     validated in all clinical     situations.     eGFR's persistently     <90 mL/min signify     possible Chronic Kidney Disease.  POCT PREGNANCY, URINE     Status: None   Collection Time    07/16/12 10:25 PM      Result Value Range   Preg Test, Ur NEGATIVE  NEGATIVE   Comment:            THE SENSITIVITY OF THIS     METHODOLOGY IS >24 mIU/mL  CG4 I-STAT (LACTIC ACID)     Status: None   Collection Time    07/16/12 11:11 PM      Result Value Range   Lactic Acid, Venous 0.75  0.5 - 2.2 mmol/L  URINALYSIS, ROUTINE W REFLEX MICROSCOPIC     Status: Abnormal   Collection Time    07/17/12  4:30 AM      Result Value Range   Color, Urine AMBER (*) YELLOW   Comment: BIOCHEMICALS MAY BE AFFECTED BY COLOR   APPearance CLOUDY (*) CLEAR   Specific Gravity, Urine 1.030  1.005 - 1.030   pH 5.5  5.0 - 8.0   Glucose, UA NEGATIVE  NEGATIVE mg/dL   Hgb urine dipstick SMALL (*) NEGATIVE   Bilirubin Urine NEGATIVE  NEGATIVE   Ketones, ur NEGATIVE  NEGATIVE mg/dL   Protein, ur NEGATIVE  NEGATIVE mg/dL   Urobilinogen, UA 1.0  0.0 - 1.0 mg/dL   Nitrite NEGATIVE  NEGATIVE   Leukocytes, UA SMALL (*) NEGATIVE  URINE MICROSCOPIC-ADD ON     Status:  Abnormal   Collection Time    07/17/12  4:30 AM      Result Value Range   Squamous Epithelial / LPF FEW (*) RARE   WBC, UA 3-6  <3 WBC/hpf   RBC / HPF 3-6  <3 RBC/hpf   Bacteria, UA FEW (*) RARE   Urine-Other MUCOUS PRESENT    WET PREP, GENITAL     Status: Abnormal   Collection Time    07/17/12  5:01 AM      Result Value Range   Yeast Wet Prep HPF POC NONE SEEN  NONE SEEN   Trich, Wet Prep NONE SEEN  NONE SEEN   Clue Cells Wet Prep HPF POC NONE SEEN  NONE SEEN   WBC, Wet Prep HPF POC FEW (*) NONE SEEN   Ct Abdomen Pelvis W Contrast  07/17/2012  *RADIOLOGY REPORT*  Clinical Data: Lower abdominal pain  CT ABDOMEN AND PELVIS WITH CONTRAST  Technique:  Multidetector CT imaging of the abdomen and pelvis was performed following the standard protocol during bolus administration of intravenous contrast.  Contrast: OMNIPAQUE IOHEXOL 300 MG/ML  SOLN  Comparison: 08/21/2006  Findings: Lung bases are clear.  Heart size within normal limits.  Unremarkable liver, spleen, pancreas, biliary system, adrenal glands, kidneys.  No hydronephrosis or hydroureter.  No CT evidence for colitis.  The appendix is normal in diameter and air filled proximally, however, with mild fluid distension at the tip, measuring up to 7-8 mm in diameter as seen on coronal image 59.  There is mild adjacent stranding / fluid along the right paracolic gutter which may arise from the appendix  or track superiorly from the ovaries.  Small bowel loops are normal course and caliber.  No free intraperitoneal air.  No lymphadenopathy.  Normal caliber vasculature.  Fat containing umbilical hernia.  Thin-walled bladder.  Unremarkable CT appearance to the uterus and adnexa.  SI DJD.  No acute osseous finding.  IMPRESSION: Findings are equivocal for early/mild tip appendicitis as above.   Original Report Authenticated By: Jearld Lesch, M.D.     Review of Systems  Constitutional: Positive for chills. Negative for fever and  malaise/fatigue.  Respiratory: Negative for cough and shortness of breath.   Cardiovascular: Negative for chest pain.  Gastrointestinal: Positive for abdominal pain. Negative for nausea, vomiting, diarrhea and constipation.  Genitourinary: Negative for dysuria and urgency.    Blood pressure 142/62, pulse 97, temperature 99.3 F (37.4 C), temperature source Oral, resp. rate 18, last menstrual period 05/17/2012, SpO2 97.00%. Physical Exam  Vitals reviewed. Constitutional: She is oriented to person, place, and time. She appears well-developed and well-nourished.  HENT:  Head: Normocephalic and atraumatic.  Eyes: No scleral icterus.  Neck: Neck supple.  Cardiovascular: Normal rate, regular rhythm and normal heart sounds.   Respiratory: Effort normal and breath sounds normal. She has no wheezes. She has no rales.  GI: Soft. Bowel sounds are normal. She exhibits no distension. There is tenderness in the right lower quadrant. There is no CVA tenderness. No hernia.  Lymphadenopathy:    She has no cervical adenopathy.  Neurological: She is alert and oriented to person, place, and time.     Assessment/Plan Likely appendicitis  Her history doesn't completely correlate with exam/ct findings but she is tender over rlq and ct shows abnl appendix although mild with some stranding. There is chance this could be other etiology but I think dx lsc and appendectomy is next step.  We discussed lap appy and risks associated with it.   Rayen Palen 07/17/2012, 6:41 AM

## 2012-07-18 ENCOUNTER — Encounter (HOSPITAL_COMMUNITY): Payer: Self-pay | Admitting: General Surgery

## 2012-07-18 DIAGNOSIS — K3580 Unspecified acute appendicitis: Secondary | ICD-10-CM | POA: Diagnosis present

## 2012-07-18 DIAGNOSIS — E668 Other obesity: Secondary | ICD-10-CM | POA: Insufficient documentation

## 2012-07-18 HISTORY — DX: Morbid (severe) obesity due to excess calories: E66.01

## 2012-07-18 HISTORY — DX: Unspecified acute appendicitis: K35.80

## 2012-07-18 LAB — GC/CHLAMYDIA PROBE AMP: GC Probe RNA: NEGATIVE

## 2012-07-18 LAB — URINE CULTURE: Colony Count: >=100000

## 2012-07-18 MED ORDER — ACETAMINOPHEN 325 MG PO TABS: 650.0000 mg | ORAL_TABLET | Freq: Four times a day (QID) | ORAL | Status: DC | PRN

## 2012-07-18 MED ORDER — OXYCODONE HCL 5 MG PO TABS: 5.0000 mg | ORAL_TABLET | ORAL | Status: DC | PRN

## 2012-07-18 NOTE — Progress Notes (Signed)
1 Day Post-Op  Subjective: Starting breakfast, sore but she is up and starting breakfast.  Objective: Vital signs in last 24 hours: Temp:  [98.3 F (36.8 C)-100.6 F (38.1 C)] 100.6 F (38.1 C) (05/14 0616) Pulse Rate:  [69-104] 97 (05/14 0616) Resp:  [9-24] 18 (05/14 0616) BP: (114-167)/(49-74) 115/65 mmHg (05/14 0616) SpO2:  [91 %-100 %] 97 % (05/14 0616) Last BM Date: 07/16/12 PO:  360 recorded.  Diet: fat modified Tm 100.6, VSS No labs this AM  Intake/Output from previous day: 05/13 0701 - 05/14 0700 In: 2547.4 [P.O.:360; I.V.:2087.4; IV Piggyback:100] Out: 1050 [Urine:1050] Intake/Output this shift:    General appearance: alert, cooperative and no distress GI: still tender, incsions look fine.  no distension, comfortable up in chair.  Lab Results:   Recent Labs  07/16/12 2159 07/17/12 0928  WBC 13.4* 12.1*  HGB 11.8* 10.6*  HCT 35.3* 32.5*  PLT 380 344    BMET  Recent Labs  07/16/12 2159 07/17/12 0928  NA 139  --   K 3.9  --   CL 102  --   CO2 26  --   GLUCOSE 104*  --   BUN 11  --   CREATININE 0.85 0.80  CALCIUM 10.1  --    PT/INR No results found for this basename: LABPROT, INR,  in the last 72 hours   Recent Labs Lab 07/16/12 2159  AST 21  ALT 22  ALKPHOS 79  BILITOT 0.3  PROT 8.2  ALBUMIN 3.9     Lipase  No results found for this basename: lipase     Studies/Results: Ct Abdomen Pelvis W Contrast  07/17/2012   *RADIOLOGY REPORT*  Clinical Data: Lower abdominal pain  CT ABDOMEN AND PELVIS WITH CONTRAST  Technique:  Multidetector CT imaging of the abdomen and pelvis was performed following the standard protocol during bolus administration of intravenous contrast.  Contrast: OMNIPAQUE IOHEXOL 300 MG/ML  SOLN  Comparison: 08/21/2006  Findings: Lung bases are clear.  Heart size within normal limits.  Unremarkable liver, spleen, pancreas, biliary system, adrenal glands, kidneys.  No hydronephrosis or hydroureter.  No CT evidence  for colitis.  The appendix is normal in diameter and air filled proximally, however, with mild fluid distension at the tip, measuring up to 7-8 mm in diameter as seen on coronal image 59.  There is mild adjacent stranding / fluid along the right paracolic gutter which may arise from the appendix or track superiorly from the ovaries.  Small bowel loops are normal course and caliber.  No free intraperitoneal air.  No lymphadenopathy.  Normal caliber vasculature.  Fat containing umbilical hernia.  Thin-walled bladder.  Unremarkable CT appearance to the uterus and adnexa.  SI DJD.  No acute osseous finding.  IMPRESSION: Findings are equivocal for early/mild tip appendicitis as above.   Original Report Authenticated By: Jearld Lesch, M.D.    Medications: . heparin  5,000 Units Subcutaneous Q8H  . piperacillin-tazobactam (ZOSYN)  IV  3.375 g Intravenous Q8H   Prior to Admission medications   Medication Sig Start Date End Date Taking? Authorizing Provider  Biotin 10 MG TABS Take 1 tablet by mouth daily.   Yes Historical Provider, MD    Assessment/Plan Appendicitis,  S/p APPENDECTOMY LAPAROSCOPIC, Liz Malady, MD 07/17/2012. Hypertension Diabetes (pt reports pre-diabetes she controls with diet.)   Obesity Body mass index is 66.6 No home meds.   Plan:  Mobilize, advance diet and home later.    LOS: 1 day  Yeray Tomas 07/18/2012

## 2012-07-18 NOTE — Progress Notes (Signed)
Physician Discharge Summary  Patient ID: Kayla Flynn MRN: [id number] DOB/AGE: Feb 07, 1991 22 y.o.  Admit date: 07/17/2012 Discharge date: 07/18/2012  Admission Diagnoses: Acute Appendicitis  Discharge Diagnoses: Same Active Problems:   Acute appendicitis   Obesity, morbid, BMI 50 or higher   PROCEDURES: APPENDECTOMY LAPAROSCOPIC, 07/17/2012, Liz Malady, MD     Hospital Course:  47 yof who presents with what she says is one week of some vague abdominal pain and points to her right side but this is mostly in her RLQ. This has gotten much worse over the last week and she was not able to tolerate anymore. Nothing was relieving it. Deep breaths, movement, palpation aggravate it. She says is having subjective fevers and chills. Some nausea, no emesis. Having normal bms. She has anorexia. She had last period about 5 weeks ago and is irregular. She does not note any gyn symptoms. No urinary symptoms. Pt. was seen and admitted.  She was taken to the OR, and underwent surgery.  She is being mobilized, and diet was advanced.  Her incisions look good and we hope to discharge after lunch. Follow up in 2 weeks.  Condition on D/C:  Improved    Disposition: 01-Home or Self Care     Medication List    TAKE these medications       acetaminophen 325 MG tablet  Commonly known as:  TYLENOL  Take 2 tablets (650 mg total) by mouth every 6 (six) hours as needed for pain.     Biotin 10 MG Tabs  Take 1 tablet by mouth daily.     oxyCODONE 5 MG immediate release tablet  Commonly known as:  Oxy IR/ROXICODONE  Take 1-2 tablets (5-10 mg total) by mouth every 4 (four) hours as needed (pain).       Follow-up Information   Schedule an appointment as soon as possible for a visit with [facility], MD. (Make an appointment in 2-3 weeks.  Ask for the [facility] clinic and if it is full you can see Dr. Janee Morn.)    Contact information:   [address] [place]  [address] [phone]       Signed: Sherrie George 07/18/2012, 9:09 AM

## 2012-07-18 NOTE — Progress Notes (Signed)
Agree Angelin Cutrone, MD, MPH, FACS Pager: [phone]  

## 2012-07-18 NOTE — Anesthesia Postprocedure Evaluation (Signed)
  Anesthesia Post Note  Patient: Kayla Flynn  Procedure(s) Performed: Procedure(s) (LRB): APPENDECTOMY LAPAROSCOPIC (N/A)  Anesthesia type: general  Patient location: PACU  Post pain: Pain level controlled  Post assessment: Patient's Cardiovascular Status Stable  Last Vitals:  Filed Vitals:   07/18/12 0616  BP: 115/65  Pulse: 97  Temp: 38.1 C  Resp: 18    Post vital signs: Reviewed and stable  Level of consciousness: sedated  Complications: No apparent anesthesia complications

## 2012-07-18 NOTE — Progress Notes (Signed)
Pt is being d/ced home with grandparents

## 2012-07-18 NOTE — Progress Notes (Signed)
Kayla Gage, MD, MPH, FACS Pager: [phone]  

## 2012-08-07 ENCOUNTER — Encounter (INDEPENDENT_AMBULATORY_CARE_PROVIDER_SITE_OTHER): Payer: Medicaid Other

## 2012-09-12 ENCOUNTER — Encounter (HOSPITAL_COMMUNITY): Payer: Self-pay | Admitting: Emergency Medicine

## 2012-09-12 ENCOUNTER — Emergency Department (HOSPITAL_COMMUNITY)
Admission: EM | Admit: 2012-09-12 | Discharge: 2012-09-12 | Disposition: A | Discharge status: Home / Self Care | Payer: Self-pay | Attending: Emergency Medicine | Admitting: Emergency Medicine

## 2012-09-12 DIAGNOSIS — Z8719 Personal history of other diseases of the digestive system: Secondary | ICD-10-CM | POA: Insufficient documentation

## 2012-09-12 DIAGNOSIS — R7309 Other abnormal glucose: Secondary | ICD-10-CM | POA: Insufficient documentation

## 2012-09-12 DIAGNOSIS — Z3202 Encounter for pregnancy test, result negative: Secondary | ICD-10-CM | POA: Insufficient documentation

## 2012-09-12 DIAGNOSIS — Z79899 Other long term (current) drug therapy: Secondary | ICD-10-CM | POA: Insufficient documentation

## 2012-09-12 DIAGNOSIS — I10 Essential (primary) hypertension: Secondary | ICD-10-CM | POA: Insufficient documentation

## 2012-09-12 DIAGNOSIS — Z87891 Personal history of nicotine dependence: Secondary | ICD-10-CM | POA: Insufficient documentation

## 2012-09-12 DIAGNOSIS — K5900 Constipation, unspecified: Secondary | ICD-10-CM | POA: Insufficient documentation

## 2012-09-12 DIAGNOSIS — R109 Unspecified abdominal pain: Secondary | ICD-10-CM

## 2012-09-12 DIAGNOSIS — R1013 Epigastric pain: Secondary | ICD-10-CM | POA: Insufficient documentation

## 2012-09-12 LAB — COMPREHENSIVE METABOLIC PANEL
Albumin: 3.2 g/dL — ABNORMAL LOW (ref 3.5–5.2)
BUN: 10 mg/dL (ref 6–23)
Chloride: 105 mEq/L (ref 96–112)
Creatinine, Ser: 0.63 mg/dL (ref 0.50–1.10)
GFR calc Af Amer: >90 mL/min (ref >90)
Glucose, Bld: 88 mg/dL (ref 70–99)
Total Bilirubin: 0.3 mg/dL (ref 0.3–1.2)
Total Protein: 8 g/dL (ref 6.0–8.3)

## 2012-09-12 LAB — CBC WITH DIFFERENTIAL/PLATELET
Basophils Relative: 0 % (ref 0–1)
Eosinophils Absolute: 0.1 10*3/uL (ref 0.0–0.7)
HCT: 33.6 % — ABNORMAL LOW (ref 36.0–46.0)
Hemoglobin: 10.8 g/dL — ABNORMAL LOW (ref 12.0–15.0)
MCH: 28 pg (ref 26.0–34.0)
MCHC: 32.1 g/dL (ref 30.0–36.0)
Monocytes Absolute: 0.5 10*3/uL (ref 0.1–1.0)
Monocytes Relative: 7 % (ref 3–12)

## 2012-09-12 LAB — URINE MICROSCOPIC-ADD ON

## 2012-09-12 LAB — URINALYSIS, ROUTINE W REFLEX MICROSCOPIC
Ketones, ur: NEGATIVE mg/dL
Leukocytes, UA: NEGATIVE
Nitrite: NEGATIVE
Urobilinogen, UA: 0.2 mg/dL (ref 0.0–1.0)
pH: 6 (ref 5.0–8.0)

## 2012-09-12 LAB — LIPASE, BLOOD: Lipase: 20 U/L (ref 11–59)

## 2012-09-12 LAB — POCT PREGNANCY, URINE: Preg Test, Ur: NEGATIVE

## 2012-09-12 MED ORDER — MORPHINE SULFATE 4 MG/ML IJ SOLN
4.0000 mg | Freq: Once | INTRAMUSCULAR | Status: AC
  Administered 2012-09-12: 4 mg via INTRAVENOUS
  Filled 2012-09-12: qty 1

## 2012-09-12 MED ORDER — SODIUM CHLORIDE 0.9 % IV BOLUS (SEPSIS)
1000.0000 mL | Freq: Once | INTRAVENOUS | Status: AC
  Administered 2012-09-12: 1000 mL via INTRAVENOUS

## 2012-09-12 MED ORDER — ONDANSETRON HCL 4 MG/2ML IJ SOLN
4.0000 mg | Freq: Once | INTRAMUSCULAR | Status: AC
  Administered 2012-09-12: 4 mg via INTRAVENOUS
  Filled 2012-09-12: qty 2

## 2012-09-12 MED ORDER — POLYETHYLENE GLYCOL 3350 17 GM/SCOOP PO POWD: 17.0000 g | Freq: Two times a day (BID) | ORAL | Status: DC

## 2012-09-12 NOTE — ED Provider Notes (Signed)
History    CSN: [id number] Arrival date & time 09/12/12  4010  First MD Initiated Contact with Patient 09/12/12 [phone]     Chief Complaint  Patient presents with  . Abdominal Pain   (Consider location/radiation/quality/duration/timing/severity/associated sxs/prior Treatment) HPI Comments: Patient is a 22 year old female with a past medical history of diabetes, morbid obesity, and hypertension who presents with a 1 week history of abdominal pain. The pain is located in her epigastrium and does not radiate. The pain is described as aching and severe. The pain started gradually and progressively worsened since the onset. No alleviating factors. Patient reports eating makes the pain worse. The patient has tried nothing for symptoms without relief. Associated symptoms include constipation. Patient denies fever, headache, NVD, chest pain, SOB, dysuria, abnormal vaginal bleeding/discharge.     Patient is a 22 y.o. female presenting with abdominal pain.  Abdominal Pain Associated symptoms include abdominal pain.   Past Medical History  Diagnosis Date  . Hypertension   . Obesity   . Prediabetes     "prediabetic; controlled w/diet" (07/17/2012)  . Acute appendicitis 07/18/2012  . Obesity, morbid, BMI 50 or higher 07/18/2012   Past Surgical History  Procedure Laterality Date  . Appendectomy  07/17/2012  . Laparoscopic appendectomy N/A 07/17/2012    Procedure: APPENDECTOMY LAPAROSCOPIC;  Surgeon: Liz Malady, MD;  Location: [facility] OR;  Service: General;  Laterality: N/A;   History reviewed. No pertinent family history. History  Substance Use Topics  . Smoking status: Former Smoker -- 0.33 packs/day for 1.2 years    Types: Cigarettes  . Smokeless tobacco: Never Used  . Alcohol Use: Yes     Comment: 07/17/2012 "mixed drink once/month"- stopped smoking 07/15/2012   OB History   Grav Para Term Preterm Abortions TAB SAB Ect Mult Living                 Review of Systems  Gastrointestinal:  Positive for abdominal pain and constipation.  All other systems reviewed and are negative.    Allergies  Review of patient's allergies indicates no known allergies.  Home Medications   Current Outpatient Rx  Name  Route  Sig  Dispense  Refill  . acetaminophen (TYLENOL) 325 MG tablet   Oral   Take 2 tablets (650 mg total) by mouth every 6 (six) hours as needed for pain.         . Biotin 10 MG TABS   Oral   Take 1 tablet by mouth daily.         Marland Kitchen oxyCODONE (OXY IR/ROXICODONE) 5 MG immediate release tablet   Oral   Take 1-2 tablets (5-10 mg total) by mouth every 4 (four) hours as needed (pain).   30 tablet   0    LMP 08/14/2012 Physical Exam  Nursing note and vitals reviewed. Constitutional: She is oriented to person, place, and time. She appears well-developed and well-nourished. No distress.  HENT:  Head: Normocephalic and atraumatic.  Eyes: Conjunctivae and EOM are normal. No scleral icterus.  Neck: Normal range of motion.  Cardiovascular: Normal rate and regular rhythm.  Exam reveals no gallop and no friction rub.   No murmur heard. Pulmonary/Chest: Effort normal and breath sounds normal. She has no wheezes. She has no rales. She exhibits no tenderness.  Abdominal: Soft. She exhibits no distension. There is tenderness. There is no rebound and no guarding.  Morbidly obese abdomen. Tenderness to palpation in epigastrium. No peritoneal signs or focal tenderness to palpation.  Musculoskeletal: Normal range of motion.  Neurological: She is alert and oriented to person, place, and time. Coordination normal.  Speech is goal-oriented. Moves limbs without ataxia.   Skin: Skin is warm and dry.  Psychiatric: She has a normal mood and affect. Her behavior is normal.    ED Course  Procedures (including critical care time) Labs Reviewed  CBC WITH DIFFERENTIAL - Abnormal; Notable for the following:    RBC 3.86 (*)    Hemoglobin 10.8 (*)    HCT 33.6 (*)    All other  components within normal limits  COMPREHENSIVE METABOLIC PANEL - Abnormal; Notable for the following:    Albumin 3.2 (*)    All other components within normal limits  URINALYSIS, ROUTINE W REFLEX MICROSCOPIC - Abnormal; Notable for the following:    APPearance CLOUDY (*)    Hgb urine dipstick TRACE (*)    All other components within normal limits  URINE MICROSCOPIC-ADD ON - Abnormal; Notable for the following:    Squamous Epithelial / LPF MANY (*)    Bacteria, UA FEW (*)    All other components within normal limits  URINE CULTURE  LIPASE, BLOOD  POCT PREGNANCY, URINE   No results found.  1. Abdominal pain   2. Constipation     MDM  8:08 AM Labs pending. Patient will have morphine and zofran for symptoms. Vitals stable and patient afebrile.   10:47 AM Labs unremarkable. Patient likely is experiencing constipation. I will start her on a Miralax regimen and provide a resource guide for follow up with a PCP. Patient instructed to return to the ED with worsening or concerning symptoms. I doubt infectious process at this time due to afebrile and normal labs.    Emilia Beck, PA-C 09/12/12 1200

## 2012-09-12 NOTE — ED Notes (Signed)
Pt reports she started having abdominal pain 1 week ago located at belly button.  Denies any nausea/vomiting or fevers.  Pt reports constipation.  Last bowel movement this am, "but it's not as much as I usually have and I had to force it out."

## 2012-09-12 NOTE — ED Notes (Signed)
Discharge instructions reviewed. Pt verbalized understanding.  

## 2012-09-12 NOTE — ED Notes (Signed)
Family at bedside. 

## 2012-09-13 LAB — URINE CULTURE

## 2012-09-13 NOTE — ED Provider Notes (Signed)
Medical screening examination/treatment/procedure(s) were performed by non-physician practitioner and as supervising physician I was immediately available for consultation/collaboration.   Carleene Cooper III, MD 09/13/12 [phone]

## 2012-11-13 ENCOUNTER — Encounter (HOSPITAL_COMMUNITY): Payer: Self-pay | Admitting: *Deleted

## 2012-11-13 ENCOUNTER — Emergency Department (HOSPITAL_COMMUNITY)
Admission: EM | Admit: 2012-11-13 | Discharge: 2012-11-13 | Disposition: A | Discharge status: Home / Self Care | Payer: Medicaid Other | Attending: Emergency Medicine | Admitting: Emergency Medicine

## 2012-11-13 DIAGNOSIS — Z8719 Personal history of other diseases of the digestive system: Secondary | ICD-10-CM | POA: Insufficient documentation

## 2012-11-13 DIAGNOSIS — I10 Essential (primary) hypertension: Secondary | ICD-10-CM | POA: Insufficient documentation

## 2012-11-13 DIAGNOSIS — N390 Urinary tract infection, site not specified: Secondary | ICD-10-CM | POA: Insufficient documentation

## 2012-11-13 DIAGNOSIS — F17200 Nicotine dependence, unspecified, uncomplicated: Secondary | ICD-10-CM | POA: Insufficient documentation

## 2012-11-13 DIAGNOSIS — Z3202 Encounter for pregnancy test, result negative: Secondary | ICD-10-CM | POA: Insufficient documentation

## 2012-11-13 DIAGNOSIS — Z79899 Other long term (current) drug therapy: Secondary | ICD-10-CM | POA: Insufficient documentation

## 2012-11-13 LAB — POCT I-STAT, CHEM 8
BUN: 13 mg/dL (ref 6–23)
Calcium, Ion: 1.26 mmol/L — ABNORMAL HIGH (ref 1.12–1.23)
Chloride: 106 meq/L (ref 96–112)
Creatinine, Ser: 1 mg/dL (ref 0.50–1.10)
Glucose, Bld: 100 mg/dL — ABNORMAL HIGH (ref 70–99)
HCT: 38 % (ref 36.0–46.0)
Hemoglobin: 12.9 g/dL (ref 12.0–15.0)
Potassium: 3.8 meq/L (ref 3.5–5.1)
Sodium: 144 meq/L (ref 135–145)
TCO2: 25 mmol/L (ref 0–100)

## 2012-11-13 LAB — URINALYSIS, ROUTINE W REFLEX MICROSCOPIC
Bilirubin Urine: NEGATIVE
Glucose, UA: NEGATIVE mg/dL
Ketones, ur: NEGATIVE mg/dL
Nitrite: NEGATIVE
Protein, ur: >300 mg/dL — AB
Specific Gravity, Urine: 1.025 (ref 1.005–1.030)
Urobilinogen, UA: 0.2 mg/dL (ref 0.0–1.0)
pH: 6.5 (ref 5.0–8.0)

## 2012-11-13 LAB — URINE MICROSCOPIC-ADD ON

## 2012-11-13 MED ORDER — CEPHALEXIN 500 MG PO CAPS: 500.0000 mg | ORAL_CAPSULE | Freq: Four times a day (QID) | ORAL | Status: DC

## 2012-11-13 NOTE — ED Notes (Signed)
Pt reports frequent and painful urination. Pt is also concerned because she is having some irregular vaginal "spotting".

## 2012-11-13 NOTE — ED Provider Notes (Signed)
CSN: [id number]     Arrival date & time 11/13/12  2017 History  This chart was scribed for non-physician practitioner working with Gavin Pound. Oletta Lamas, MD by Danella Maiers, ED Scribe. This patient was seen in room TR07C/TR07C and the patient's care was started at 9:06 PM.    Chief Complaint  Patient presents with  . Dysuria   The history is provided by the patient. No language interpreter was used.   HPI Comments: Kayla Flynn is a 22 y.o. female who presents to the Emergency Department complaining of frequent and painful urination described as burning onset 2 days ago. She also reports irregular "spotting" although she is not on her period currently.  She denies nausea, vomiting, fevers, and chills. She was diagnosed as prediabetic and hypertensive in the past but her doctors said it is under control and she is no longer taken medication for these conditions.    Past Medical History  Diagnosis Date  . Hypertension   . Obesity   . Prediabetes     "prediabetic; controlled w/diet" (07/17/2012)  . Acute appendicitis 07/18/2012  . Obesity, morbid, BMI 50 or higher 07/18/2012   Past Surgical History  Procedure Laterality Date  . Appendectomy  07/17/2012  . Laparoscopic appendectomy N/A 07/17/2012    Procedure: APPENDECTOMY LAPAROSCOPIC;  Surgeon: Liz Malady, MD;  Location: [facility] OR;  Service: General;  Laterality: N/A;   No family history on file. History  Substance Use Topics  . Smoking status: Current Every Day Smoker -- 0.33 packs/day for 1.2 years    Types: Cigarettes  . Smokeless tobacco: Never Used  . Alcohol Use: No     Comment: 07/17/2012 "mixed drink once/month"- stopped smoking 07/15/2012   OB History   Grav Para Term Preterm Abortions TAB SAB Ect Mult Living                 Review of Systems  All other systems reviewed and are negative.    Allergies  Review of patient's allergies indicates no known allergies.  Home Medications   Current Outpatient Rx  Name  Route   Sig  Dispense  Refill  . acetaminophen (TYLENOL) 500 MG tablet   Oral   Take 500 mg by mouth every 6 (six) hours as needed for pain.         . polyethylene glycol powder (GLYCOLAX/MIRALAX) powder   Oral   Take 17 g by mouth 2 (two) times daily. Until daily soft stools  OTC   255 g   0    BP 174/86  Pulse 110  Temp(Src) 99 F (37.2 C) (Oral)  Resp 18  Ht 5\' 4"  (1.626 m)  Wt 321 lb (145.605 kg)  BMI 55.07 kg/m2  SpO2 98%  LMP 10/19/2012 Physical Exam  Nursing note and vitals reviewed. Constitutional: She is oriented to person, place, and time. She appears well-developed and well-nourished. No distress.  HENT:  Head: Normocephalic and atraumatic.  Eyes: EOM are normal.  Neck: Neck supple. No tracheal deviation present.  Cardiovascular: Normal rate.   Pulmonary/Chest: Effort normal. No respiratory distress.  Abdominal:  No CVA tenderness.   Musculoskeletal: Normal range of motion.  Neurological: She is alert and oriented to person, place, and time.  Skin: Skin is warm and dry.  Psychiatric: She has a normal mood and affect. Her behavior is normal.    ED Course  Procedures (including critical care time) Medications - No data to display  DIAGNOSTIC STUDIES: Oxygen Saturation is 98% on room  air, normal by my interpretation.    COORDINATION OF CARE: 9:08 PM- Discussed treatment plan with pt which includes treatment with antibiotics and pt agrees to plan.    Labs Review Labs Reviewed  URINALYSIS, ROUTINE W REFLEX MICROSCOPIC - Abnormal; Notable for the following:    APPearance TURBID (*)    Hgb urine dipstick LARGE (*)    Protein, ur >300 (*)    Leukocytes, UA LARGE (*)    All other components within normal limits  URINE MICROSCOPIC-ADD ON - Abnormal; Notable for the following:    Bacteria, UA MANY (*)    All other components within normal limits  URINE CULTURE  POCT PREGNANCY, URINE   Results for orders placed during the hospital encounter of 11/13/12   URINALYSIS, ROUTINE W REFLEX MICROSCOPIC      Result Value Range   Color, Urine YELLOW  YELLOW   APPearance TURBID (*) CLEAR   Specific Gravity, Urine 1.025  1.005 - 1.030   pH 6.5  5.0 - 8.0   Glucose, UA NEGATIVE  NEGATIVE mg/dL   Hgb urine dipstick LARGE (*) NEGATIVE   Bilirubin Urine NEGATIVE  NEGATIVE   Ketones, ur NEGATIVE  NEGATIVE mg/dL   Protein, ur >161 (*) NEGATIVE mg/dL   Urobilinogen, UA 0.2  0.0 - 1.0 mg/dL   Nitrite NEGATIVE  NEGATIVE   Leukocytes, UA LARGE (*) NEGATIVE  URINE MICROSCOPIC-ADD ON      Result Value Range   Squamous Epithelial / LPF RARE  RARE   WBC, UA TOO NUMEROUS TO COUNT  <3 WBC/hpf   RBC / HPF TOO NUMEROUS TO COUNT  <3 RBC/hpf   Bacteria, UA MANY (*) RARE  POCT PREGNANCY, URINE      Result Value Range   Preg Test, Ur NEGATIVE  NEGATIVE  POCT I-STAT, CHEM 8      Result Value Range   Sodium 144  135 - 145 mEq/L   Potassium 3.8  3.5 - 5.1 mEq/L   Chloride 106  96 - 112 mEq/L   BUN 13  6 - 23 mg/dL   Creatinine, Ser 0.96  0.50 - 1.10 mg/dL   Glucose, Bld 045 (*) 70 - 99 mg/dL   Calcium, Ion 4.09 (*) 1.12 - 1.23 mmol/L   TCO2 25  0 - 100 mmol/L   Hemoglobin 12.9  12.0 - 15.0 g/dL   HCT 81.1  91.4 - 78.2 %   No results found.    MDM   1. UTI (lower urinary tract infection)    Patient with urgency, frequency, and dysuria. Will treat for UTI. Doubt myeloma, no fever, no CVA tenderness. Patient has some protein in her urine, recommended that she have this checked again in a week following treatment. She understands and agrees with plan. She does not have any focal abdominal pain. She is stable and ready for discharge.     I personally performed the services described in this documentation, which was scribed in my presence. The recorded information has been reviewed and is accurate.    Roxy Horseman, PA-C 11/13/12 2315

## 2012-11-14 NOTE — ED Provider Notes (Signed)
Medical screening examination/treatment/procedure(s) were performed by non-physician practitioner and as supervising physician I was immediately available for consultation/collaboration.   Marcianne Ozbun Y. Madailein Londo, MD 11/14/12 0002 

## 2012-11-16 LAB — URINE CULTURE: Colony Count: >=100000

## 2013-01-01 ENCOUNTER — Emergency Department (HOSPITAL_COMMUNITY)
Admission: EM | Admit: 2013-01-01 | Discharge: 2013-01-01 | Disposition: A | Discharge status: Home / Self Care | Payer: Medicaid Other | Attending: Emergency Medicine | Admitting: Emergency Medicine

## 2013-01-01 ENCOUNTER — Encounter (HOSPITAL_COMMUNITY): Payer: Self-pay | Admitting: Emergency Medicine

## 2013-01-01 ENCOUNTER — Emergency Department (HOSPITAL_COMMUNITY): Payer: Medicaid Other

## 2013-01-01 DIAGNOSIS — Z6841 Body Mass Index (BMI) 40.0 and over, adult: Secondary | ICD-10-CM | POA: Insufficient documentation

## 2013-01-01 DIAGNOSIS — I10 Essential (primary) hypertension: Secondary | ICD-10-CM | POA: Insufficient documentation

## 2013-01-01 DIAGNOSIS — K5900 Constipation, unspecified: Secondary | ICD-10-CM | POA: Insufficient documentation

## 2013-01-01 DIAGNOSIS — Z3202 Encounter for pregnancy test, result negative: Secondary | ICD-10-CM | POA: Insufficient documentation

## 2013-01-01 DIAGNOSIS — R109 Unspecified abdominal pain: Secondary | ICD-10-CM | POA: Insufficient documentation

## 2013-01-01 DIAGNOSIS — F17200 Nicotine dependence, unspecified, uncomplicated: Secondary | ICD-10-CM | POA: Insufficient documentation

## 2013-01-01 DIAGNOSIS — Z79899 Other long term (current) drug therapy: Secondary | ICD-10-CM | POA: Insufficient documentation

## 2013-01-01 LAB — CBC WITH DIFFERENTIAL/PLATELET
Basophils Absolute: 0 10*3/uL (ref 0.0–0.1)
Basophils Relative: 0 % (ref 0–1)
Eosinophils Absolute: 0.1 10*3/uL (ref 0.0–0.7)
HCT: 36.3 % (ref 36.0–46.0)
Hemoglobin: 11.9 g/dL — ABNORMAL LOW (ref 12.0–15.0)
MCH: 29.2 pg (ref 26.0–34.0)
MCHC: 32.8 g/dL (ref 30.0–36.0)
Monocytes Absolute: 0.5 10*3/uL (ref 0.1–1.0)
Monocytes Relative: 6 % (ref 3–12)
Neutro Abs: 4 10*3/uL (ref 1.7–7.7)
Neutrophils Relative %: 56 % (ref 43–77)
RDW: 13.2 % (ref 11.5–15.5)

## 2013-01-01 LAB — COMPREHENSIVE METABOLIC PANEL
AST: 19 U/L (ref 0–37)
Albumin: 4 g/dL (ref 3.5–5.2)
BUN: 7 mg/dL (ref 6–23)
Chloride: 102 mEq/L (ref 96–112)
Creatinine, Ser: 0.65 mg/dL (ref 0.50–1.10)
Total Protein: 8.6 g/dL — ABNORMAL HIGH (ref 6.0–8.3)

## 2013-01-01 LAB — URINALYSIS, ROUTINE W REFLEX MICROSCOPIC
Ketones, ur: NEGATIVE mg/dL
Leukocytes, UA: NEGATIVE
Nitrite: NEGATIVE
Protein, ur: NEGATIVE mg/dL
Urobilinogen, UA: 0.2 mg/dL (ref 0.0–1.0)

## 2013-01-01 MED ORDER — POLYETHYLENE GLYCOL 3350 17 GM/SCOOP PO POWD: 17.0000 g | Freq: Every day | ORAL | Status: AC

## 2013-01-01 NOTE — ED Notes (Signed)
Pt c/o upper abd pain x 2 days. Denies n/v/d. States "i might be constipated i normally use the bathroom every day but i havent used it for 2 days."

## 2013-01-01 NOTE — ED Notes (Signed)
Pt states understanding of discharge instructions 

## 2013-01-01 NOTE — ED Notes (Signed)
Pt states she had "just a little bit of pain for the last several days and feeling of fullness". Reports difficulty with bowel movement. Resting comfortably. Last meal approx . Denies nausea, vomiting, diarrhea, fever.

## 2013-01-01 NOTE — ED Provider Notes (Signed)
CSN: [id number]     Arrival date & time 01/01/13  1506 History   First MD Initiated Contact with Patient 01/01/13 1837     Chief Complaint  Patient presents with  . Abdominal Pain   (Consider location/radiation/quality/duration/timing/severity/associated sxs/prior Treatment) Patient is a 22 y.o. female presenting with abdominal pain. The history is provided by the patient.  Abdominal Pain Associated symptoms: constipation   Associated symptoms: no chest pain, no diarrhea, no nausea, no shortness of breath and no vomiting    patient has had dull upper abdominal pain the last couple days. She states she's had constipation. No bowel movement in 3 days. She states that is unusual for her. No nausea vomiting. No blood in stool. No fevers. She is obese. No fevers. No dysuria.  Past Medical History  Diagnosis Date  . Hypertension   . Obesity   . Prediabetes     "prediabetic; controlled w/diet" (07/17/2012)  . Acute appendicitis 07/18/2012  . Obesity, morbid, BMI 50 or higher 07/18/2012   Past Surgical History  Procedure Laterality Date  . Appendectomy  07/17/2012  . Laparoscopic appendectomy N/A 07/17/2012    Procedure: APPENDECTOMY LAPAROSCOPIC;  Surgeon: Liz Malady, MD;  Location: [facility] OR;  Service: General;  Laterality: N/A;   History reviewed. No pertinent family history. History  Substance Use Topics  . Smoking status: Current Every Day Smoker -- 0.10 packs/day for 1.2 years    Types: Cigarettes  . Smokeless tobacco: Never Used  . Alcohol Use: No     Comment: 07/17/2012 "mixed drink once/month"- stopped smoking 07/15/2012   OB History   Grav Para Term Preterm Abortions TAB SAB Ect Mult Living                 Review of Systems  Constitutional: Negative for activity change and appetite change.  Eyes: Negative for pain.  Respiratory: Negative for chest tightness and shortness of breath.   Cardiovascular: Negative for chest pain and leg swelling.  Gastrointestinal: Positive  for abdominal pain and constipation. Negative for nausea, vomiting and diarrhea.  Genitourinary: Negative for flank pain.  Musculoskeletal: Negative for back pain and neck stiffness.  Skin: Negative for rash.  Neurological: Negative for weakness, numbness and headaches.  Psychiatric/Behavioral: Negative for behavioral problems.    Allergies  Review of patient's allergies indicates no known allergies.  Home Medications   Current Outpatient Rx  Name  Route  Sig  Dispense  Refill  . polyethylene glycol powder (GLYCOLAX/MIRALAX) powder   Oral   Take 17 g by mouth daily.   255 g   0    BP 123/45  Pulse 70  Temp(Src) 98.7 F (37.1 C) (Oral)  Resp 18  Ht 4\' 11"  (1.499 m)  Wt 316 lb 12.8 oz (143.7 kg)  BMI 63.95 kg/m2  SpO2 99%  LMP 12/02/2012 Physical Exam  Nursing note and vitals reviewed. Constitutional: She is oriented to person, place, and time. She appears well-developed and well-nourished.  Patient is obese  HENT:  Head: Normocephalic and atraumatic.  Eyes: EOM are normal. Pupils are equal, round, and reactive to light.  Neck: Normal range of motion. Neck supple.  Cardiovascular: Normal rate, regular rhythm and normal heart sounds.   No murmur heard. Pulmonary/Chest: Effort normal and breath sounds normal. No respiratory distress. She has no wheezes. She has no rales.  Abdominal: Soft. Bowel sounds are normal. She exhibits no distension and no mass. There is tenderness. There is no rebound and no guarding.  Musculoskeletal: Normal  range of motion.  Neurological: She is alert and oriented to person, place, and time. No cranial nerve deficit.  Skin: Skin is warm and dry.  Psychiatric: She has a normal mood and affect. Her speech is normal.    ED Course  Procedures (including critical care time) Labs Review Labs Reviewed  CBC WITH DIFFERENTIAL - Abnormal; Notable for the following:    Hemoglobin 11.9 (*)    All other components within normal limits  COMPREHENSIVE  METABOLIC PANEL - Abnormal; Notable for the following:    Glucose, Bld 100 (*)    Total Protein 8.6 (*)    Total Bilirubin 0.2 (*)    All other components within normal limits  URINALYSIS, ROUTINE W REFLEX MICROSCOPIC - Abnormal; Notable for the following:    APPearance HAZY (*)    All other components within normal limits  LIPASE, BLOOD  POCT PREGNANCY, URINE   Imaging Review Dg Abd 2 Views  01/01/2013   CLINICAL DATA:  Abdominal pain.  EXAM: ABDOMEN - 2 VIEW  COMPARISON:  CT 07/17/2012  FINDINGS: The bowel gas pattern is normal. There is no evidence of free air. No radio-opaque calculi or other significant radiographic abnormality is seen.  IMPRESSION: Negative.   Electronically Signed   By: Oley Balm M.D.   On: 01/01/2013 19:29    EKG Interpretation   None       MDM   1. Abdominal pain    Patient has upper abdominal pain. Has had some constipation. Lab work reassuring. X-ray does not show obstruction. LFTs are not elevated. White count is not elevated. Will discharge home with laxatives, but may need followup depending on resolution of pain.    Juliet Rude. Rubin Payor, MD 01/01/13 1955

## 2013-01-28 ENCOUNTER — Emergency Department (INDEPENDENT_AMBULATORY_CARE_PROVIDER_SITE_OTHER)
Admission: EM | Admit: 2013-01-28 | Discharge: 2013-01-28 | Disposition: A | Discharge status: Home / Self Care | Payer: BC Managed Care – PPO | Source: Home / Self Care | Attending: Emergency Medicine | Admitting: Emergency Medicine

## 2013-01-28 ENCOUNTER — Encounter (HOSPITAL_COMMUNITY): Payer: Self-pay | Admitting: Emergency Medicine

## 2013-01-28 DIAGNOSIS — A088 Other specified intestinal infections: Secondary | ICD-10-CM

## 2013-01-28 DIAGNOSIS — A084 Viral intestinal infection, unspecified: Secondary | ICD-10-CM

## 2013-01-28 LAB — POCT I-STAT, CHEM 8
Chloride: 109 mEq/L (ref 96–112)
Creatinine, Ser: 1 mg/dL (ref 0.50–1.10)
HCT: 46 % (ref 36.0–46.0)
Hemoglobin: 15.6 g/dL — ABNORMAL HIGH (ref 12.0–15.0)
Potassium: 3.9 mEq/L (ref 3.5–5.1)
Sodium: 143 mEq/L (ref 135–145)

## 2013-01-28 MED ORDER — ONDANSETRON 8 MG PO TBDP: 8.0000 mg | ORAL_TABLET | Freq: Three times a day (TID) | ORAL | Status: DC | PRN

## 2013-01-28 MED ORDER — ONDANSETRON HCL 4 MG/2ML IJ SOLN
4.0000 mg | Freq: Once | INTRAMUSCULAR | Status: AC
  Administered 2013-01-28: 4 mg via INTRAVENOUS

## 2013-01-28 MED ORDER — ONDANSETRON HCL 4 MG/2ML IJ SOLN
INTRAMUSCULAR | Status: AC
  Filled 2013-01-28: qty 2

## 2013-01-28 MED ORDER — SODIUM CHLORIDE 0.9 % IV SOLN
INTRAVENOUS | Status: DC
  Administered 2013-01-28: 10:00:00 via INTRAVENOUS

## 2013-01-28 MED ORDER — DIPHENOXYLATE-ATROPINE 2.5-0.025 MG PO TABS: 1.0000 | ORAL_TABLET | Freq: Four times a day (QID) | ORAL | Status: DC | PRN

## 2013-01-28 NOTE — ED Notes (Signed)
Provided with pilow and blankets, offered repositioning of exam table for patient comfort

## 2013-01-28 NOTE — ED Provider Notes (Signed)
Chief Complaint:   Chief Complaint  Patient presents with  . Abdominal Pain    History of Present Illness:   Kayla Flynn is a 22 year old female who has had a two-day history of nausea, diarrhea, chills, tightness, sweats, dizziness, and borborygmus. She denies any fever, vomiting, or blood in the stool. She estimates 7 loose stools yesterday into today. She denies any abdominal pain or cramping. She has had no sick exposures, no suspicious ingestions, no foreign travel or animal exposure.  Review of Systems:  Other than noted above, the patient denies any of the following symptoms: Systemic:  No fevers, chills, sweats, weight loss or gain, fatigue, or tiredness. ENT:  No nasal congestion, rhinorrhea, or sore throat. Lungs:  No cough, wheezing, or shortness of breath. Cardiac:  No chest pain, syncope, or presyncope. GI:  No abdominal pain, nausea, vomiting, anorexia, diarrhea, constipation, blood in stool or vomitus. GU:  No dysuria, frequency, or urgency.  PMFSH:  Past medical history, family history, social history, meds, and allergies were reviewed.   Physical Exam:   Vital signs:  BP 126/86  Pulse 99  Temp(Src) 98.8 F (37.1 C) (Oral)  Resp 20  SpO2 97%  LMP 01/12/2013 Filed Vitals:   01/28/13 0905 01/28/13 0946 Supine  01/28/13 0948 Sitting  01/28/13 0950 Standing   BP: 108/76 120/84 139/87 126/86  Pulse: 87 90 81 99  Temp: 98.8 F (37.1 C)     TempSrc: Oral     Resp: 20     SpO2: 97%      General:  Alert and oriented.  In no distress.  Skin warm and dry.  Good skin turgor, brisk capillary refill. ENT:  No scleral icterus, moist mucous membranes, no oral lesions, pharynx clear. Lungs:  Breath sounds clear and equal bilaterally.  No wheezes, rales, or rhonchi. Heart:  Rhythm regular, without extrasystoles.  No gallops or murmers. Abdomen:  Soft and nontender without organomegaly or mass. All sounds were loud and hyperactive. Skin: Clear, warm, and dry.  Good turgor.   Brisk capillary refill.  Labs:   Results for orders placed during the hospital encounter of 01/28/13  POCT I-STAT, CHEM 8      Result Value Range   Sodium 143  135 - 145 mEq/L   Potassium 3.9  3.5 - 5.1 mEq/L   Chloride 109  96 - 112 mEq/L   BUN 13  6 - 23 mg/dL   Creatinine, Ser 1.61  0.50 - 1.10 mg/dL   Glucose, Bld 92  70 - 99 mg/dL   Calcium, Ion 0.96 (*) 1.12 - 1.23 mmol/L   TCO2 20  0 - 100 mmol/L   Hemoglobin 15.6 (*) 12.0 - 15.0 g/dL   HCT 04.5  40.9 - 81.1 %     Course in Urgent Care Center:   The patient was given a liter of normal saline intravenously and Zofran 4 mg IV. Thereafter she felt a lot better. She had no further nausea, emesis, or diarrheal stools.  Assessment:  The encounter diagnosis was Viral gastroenteritis.  Plan:   1.  Meds:  The following meds were prescribed:   New Prescriptions   DIPHENOXYLATE-ATROPINE (LOMOTIL) 2.5-0.025 MG PER TABLET    Take 1 tablet by mouth 4 (four) times daily as needed for diarrhea or loose stools.   ONDANSETRON (ZOFRAN ODT) 8 MG DISINTEGRATING TABLET    Take 1 tablet (8 mg total) by mouth every 8 (eight) hours as needed for nausea.    2.  Patient Education/Counseling:  The patient was given appropriate handouts, self care instructions, and instructed in symptomatic relief. The patient was told to stay on clear liquids for the remainder of the day, then advance to a B.R.A.T. diet starting tomorrow.  3.  Follow up:  The patient was told to follow up if no better in 3 to 4 days, if becoming worse in any way, and given some red flag symptoms such as persistent vomiting, any sign of GI bleeding, or severe abdominal pain which would prompt immediate return.  Follow up here as necessary.       Reuben Likes, MD 01/28/13 [phone]

## 2013-01-28 NOTE — ED Notes (Signed)
Call from [facility], pt declines zofran rx

## 2013-06-16 ENCOUNTER — Emergency Department (HOSPITAL_COMMUNITY)
Admission: EM | Admit: 2013-06-16 | Discharge: 2013-06-16 | Disposition: A | Discharge status: Home / Self Care | Payer: BC Managed Care – PPO | Attending: Emergency Medicine | Admitting: Emergency Medicine

## 2013-06-16 ENCOUNTER — Encounter (HOSPITAL_COMMUNITY): Payer: Self-pay | Admitting: Emergency Medicine

## 2013-06-16 DIAGNOSIS — K029 Dental caries, unspecified: Secondary | ICD-10-CM

## 2013-06-16 DIAGNOSIS — R110 Nausea: Secondary | ICD-10-CM | POA: Insufficient documentation

## 2013-06-16 DIAGNOSIS — F17200 Nicotine dependence, unspecified, uncomplicated: Secondary | ICD-10-CM | POA: Insufficient documentation

## 2013-06-16 DIAGNOSIS — Z79899 Other long term (current) drug therapy: Secondary | ICD-10-CM | POA: Insufficient documentation

## 2013-06-16 DIAGNOSIS — R7309 Other abnormal glucose: Secondary | ICD-10-CM | POA: Insufficient documentation

## 2013-06-16 DIAGNOSIS — I10 Essential (primary) hypertension: Secondary | ICD-10-CM | POA: Insufficient documentation

## 2013-06-16 MED ORDER — PENICILLIN V POTASSIUM 500 MG PO TABS: 500.0000 mg | ORAL_TABLET | Freq: Four times a day (QID) | ORAL | Status: DC

## 2013-06-16 MED ORDER — OXYCODONE-ACETAMINOPHEN 5-325 MG PO TABS: 1.0000 | ORAL_TABLET | ORAL | Status: DC | PRN

## 2013-06-16 MED ORDER — NAPROXEN 500 MG PO TABS: 500.0000 mg | ORAL_TABLET | Freq: Two times a day (BID) | ORAL | Status: DC

## 2013-06-16 NOTE — ED Notes (Signed)
Pt. Stated, I started having a toothache 3 days ago and was taken tylenol and its not helping.

## 2013-06-16 NOTE — ED Provider Notes (Signed)
CSN: [id number]     Arrival date & time 06/16/13  1219 History  This chart was scribed for non-physician practitioner, Junius FinnerErin O'Malley, PA-C working with Ward GivensIva L Knapp, MD by Greggory StallionKayla Andersen, ED scribe. This patient was seen in room TR09C/TR09C and the patient's care was started at 12:55 PM.    Chief Complaint  Patient presents with  . Dental Pain   The history is provided by the patient. No language interpreter was used.   HPI Comments: Kayla Flynn is a 23 y.o. female who presents to the Emergency Department complaining of gradual onset, constant left upper and lower dental pain that started 2-3 days ago. Rates pain 9/10. Pt has also had intermittent nausea. She has taken tylenol PM with no relief. Denies fever, emesis, trouble breathing, difficulty swallowing.   Past Medical History  Diagnosis Date  . Hypertension   . Obesity   . Prediabetes     "prediabetic; controlled w/diet" (07/17/2012)  . Acute appendicitis 07/18/2012  . Obesity, morbid, BMI 50 or higher 07/18/2012   Past Surgical History  Procedure Laterality Date  . Appendectomy  07/17/2012  . Laparoscopic appendectomy N/A 07/17/2012    Procedure: APPENDECTOMY LAPAROSCOPIC;  Surgeon: Liz MaladyBurke E Thompson, MD;  [facility] OR;  Service: General;  Laterality: N/A;   No family history on file. History  Substance Use Topics  . Smoking status: Current Every Day Smoker -- 0.10 packs/day for 1.2 years    Types: Cigarettes  . Smokeless tobacco: Never Used  . Alcohol Use: No     Comment: 07/17/2012 "mixed drink once/month"- stopped smoking 07/15/2012   OB History   Grav Para Term Preterm Abortions TAB SAB Ect Mult Living                 Review of Systems  Constitutional: Negative for fever.  HENT: Positive for dental problem. Negative for trouble swallowing.   Gastrointestinal: Positive for nausea. Negative for vomiting.  All other systems reviewed and are negative.  Allergies  Review of patient's allergies indicates no known  allergies.  Home Medications   Current Outpatient Rx  Name  Route  Sig  Dispense  Refill  . Bismuth Subsalicylate (PEPTO-BISMOL PO)   Oral   Take by mouth.         . calcium carbonate (TUMS - DOSED IN MG ELEMENTAL CALCIUM) 500 MG chewable tablet   Oral   Chew 1 tablet by mouth daily.         . diphenoxylate-atropine (LOMOTIL) 2.5-0.025 MG per tablet   Oral   Take 1 tablet by mouth 4 (four) times daily as needed for diarrhea or loose stools.   16 tablet   0   . naproxen (NAPROSYN) 500 MG tablet   Oral   Take 1 tablet (500 mg total) by mouth 2 (two) times daily.   30 tablet   0   . ondansetron (ZOFRAN ODT) 8 MG disintegrating tablet   Oral   Take 1 tablet (8 mg total) by mouth every 8 (eight) hours as needed for nausea.   20 tablet   0   . oxyCODONE-acetaminophen (PERCOCET/ROXICET) 5-325 MG per tablet   Oral   Take 1-2 tablets by mouth every 4 (four) hours as needed for severe pain.   10 tablet   0   . penicillin v potassium (VEETID) 500 MG tablet   Oral   Take 1 tablet (500 mg total) by mouth 4 (four) times daily.   40 tablet   0  BP 158/89  Pulse 86  Temp(Src) 98.6 F (37 C) (Oral)  Resp 16  Ht 4\' 11"  (1.499 m)  Wt 332 lb (150.594 kg)  BMI 67.02 kg/m2  SpO2 100%  LMP 06/11/2013  Physical Exam  Nursing note and vitals reviewed. Constitutional: She is oriented to person, place, and time. She appears well-developed and well-nourished.  HENT:  Head: Normocephalic and atraumatic.  Mouth/Throat:    Dental fillings in teeth #14, #15, #16. No dental abscess. No discharge.   Eyes: EOM are normal.  Neck: Normal range of motion.  Cardiovascular: Normal rate.   Pulmonary/Chest: Effort normal.  Musculoskeletal: Normal range of motion.  Neurological: She is alert and oriented to person, place, and time.  Skin: Skin is warm and dry.  Psychiatric: She has a normal mood and affect. Her behavior is normal.    ED Course  Procedures (including critical  care time)  DIAGNOSTIC STUDIES: Oxygen Saturation is 100% on RA, normal by my interpretation.    COORDINATION OF CARE: 12:56 PM-Discussed treatment plan which includes an antibiotic and a short course of pain medication with pt at bedside and pt agreed to plan. Will give pt a dental referral and advised her to follow up.   Labs Review Labs Reviewed - No data to display Imaging Review No results found.   EKG Interpretation None      MDM   Final diagnoses:  Pain due to dental caries    pt c/o pain due to dental caries. No dental abscess present.  Will discharge home to f/u with Dr. Lucky Cowboy, DDS.  Rx: percocet, PCN, naproxen.  Return precautions provided. Pt verbalized understanding and agreement with tx plan.    I personally performed the services described in this documentation, which was scribed in my presence. The recorded information has been reviewed and is accurate.  Junius Finner, PA-C 06/16/13 1648  Junius Finner, PA-C 06/26/13 [phone]

## 2013-06-16 NOTE — Discharge Instructions (Signed)
Dental Caries Dental caries is tooth decay. This decay can cause a hole in teeth (cavity) that can get bigger and deeper over time. HOME CARE  Brush and floss your teeth. Do this at least two times a day.  Use a fluoride toothpaste.  Use a mouth rinse if told by your dentist or doctor.  Eat less sugary and starchy foods. Drink less sugary drinks.  Avoid snacking often on sugary and starchy foods. Avoid sipping often on sugary drinks.  Keep regular checkups and cleanings with your dentist.  Use fluoride supplements if told by your dentist or doctor.  Allow fluoride to be applied to teeth if told by your dentist or doctor. MAKE SURE YOU:  Understand these instructions.  Will watch your condition.  Will get help right away if you are not doing well or get worse. Document Released: 12/01/2007 Document Revised: 10/24/2012 Document Reviewed: 02/24/2012 [facility] Patient Information 2014 [employer], [place].  Dental Pain Toothache is pain in or around a tooth. It may get worse with chewing or with cold or heat.  HOME CARE  Your dentist may use a numbing medicine during treatment. If so, you may need to avoid eating until the medicine wears off. Ask your dentist about this.  Only take medicine as told by your dentist or doctor.  Avoid chewing food near the painful tooth until after all treatment is done. Ask your dentist about this. GET HELP RIGHT AWAY IF:   The problem gets worse or new problems appear.  You have a fever.  There is redness and puffiness (swelling) of the face, jaw, or neck.  You cannot open your mouth.  There is pain in the jaw.  There is very bad pain that is not helped by medicine. MAKE SURE YOU:   Understand these instructions.  Will watch your condition.  Will get help right away if you are not doing well or get worse. Document Released: 08/10/2007 Document Revised: 05/16/2011 Document Reviewed: 08/10/2007 [facility] Patient Information 2014  [place], [place].  Emergency Department Resource Guide 1) Find a Doctor and Pay Out of Pocket Although you won't have to find out who is covered by your insurance plan, it is a good idea to ask around and get recommendations. You will then need to call the office and see if the doctor you have chosen will accept you as a new patient and what types of options they offer for patients who are self-pay. Some doctors offer discounts or will set up payment plans for their patients who do not have insurance, but you will need to ask so you aren't surprised when you get to your appointment.  2) Contact Your Local Health Department Not all health departments have doctors that can see patients for sick visits, but many do, so it is worth a call to see if yours does. If you don't know where your local health department is, you can check in your phone book. The CDC also has a tool to help you locate your state's health department, and many state websites also have listings of all of their local health departments.  3) Find a Walk-in Clinic If your illness is not likely to be very severe or complicated, you may want to try a walk in clinic. These are popping up all over the country in pharmacies, drugstores, and shopping centers. They're usually staffed by nurse practitioners or physician assistants that have been trained to treat common illnesses and complaints. They're usually fairly quick and inexpensive. However, if  you have serious medical issues or chronic medical problems, these are probably not your best option.  No Primary Care Doctor: - Call Health Connect at  [phone] - they can help you locate a primary care doctor that  accepts your insurance, provides certain services, etc. - Physician Referral Service- [phone]  Chronic Pain Problems: Organization         Address  Phone   Notes  [facility]  [phone] Patients need to be referred by their primary care doctor.    Medication Assistance: Organization         Address  Phone   Notes  [facility] Medication [facility] [address] [place] [place], [place] [address] [phone] --Must be a resident of [facility] -- Must have NO insurance coverage whatsoever (no Medicaid/ Medicare, etc.) -- The pt. MUST have a primary care doctor that directs their care regularly and follows them in the community   [facility]  [phone]   [employer]  [phone]    Agencies that provide inexpensive medical care: Organization         Address                                                       Phone                                                                            Notes  [facility]  [phone]   [facility]    [phone]   [facility] [address] [place], [place] [address] [phone]   [facility] [address] [place] [phone]   Planned Parenthood    [phone]   [facility]    [phone]   [facility] and [facility]  [address], [place] Phone:  [phone], Fax:  [phone] Hours of Operation:  9 am - 6 pm, M-F.  Also accepts Medicaid/Medicare and self-pay.  [facility]  [address],  Phone: [phone], Fax: [phone]. Hours of Operation:  8:30 am - 5:30 pm, M-F.  Also accepts Medicaid and self-pay.  [facility] [place] [address], [place] Phone: [phone]   [facility] [address] Natasha Bence [place], [place] [phone], Ext. [phone] Mondays & Thursdays: 7-9 AM.  First 15 patients are seen on a first come, first serve basis.    Medicaid-accepting [facility] Providers:  Organization         Address  Phone                               Notes  [facility] [address], [place] [phone]) [phone] Also accepts self-pay patients.  [facility] [address], [place]  [phone]   [facility] [address], [place] [phone]   [facility] [address] [place] [address], [place] [phone]   Renaye RakersVeita Bland [address], [place]   [phone] Only accepts [place] Access [place] patients after they have their name applied to their card.   Self-Pay (no insurance) in [facility] [place]:   Organization         Address                                                     Phone               Notes  Sickle Cell Patients, [facility] [address], [place] [phone]   [facility] [address], [place] [phone]   [facility]  [address], [place] [phone]   [facility]/Dr. Osei-Bonsu  [address] [place] [address], [place] or [phone], [place] [phone] Phone number for both [place] and [place] locations is the same.  [facility] [address], [place] [phone]   [facility] [place] [address] [place] [address], [place] or [address] [phone]   [facility] [address], [place] [phone], phone; [phone], fax Sees patients 1st and 3rd Saturday of every month.  Must not qualify for public or private insurance (i.e. Medicaid, Medicare, [place] Health Choice, Veterans' Benefits)  Household income should be no more than 200% of the poverty level The clinic cannot treat you if you are pregnant or think you are pregnant  Sexually transmitted diseases are not treated at the clinic.    Dental Care: Organization         Address                                  Phone                       Notes  [facility] of  [facility] [address], [place] [phone] Accepts children up to age 22 who are enrolled in [place] or [place] Health Choice; pregnant women with a Medicaid card; and children who have applied for Medicaid or [place] Health Choice, but were declined, whose parents can pay a reduced fee at time of service.  [facility] [place]  [address], [place] [phone]) [phone] Accepts children up to age 22 who are enrolled in [place]icaid or [place] Health Choice; pregnant women with a Medicaid card; and children who have applied for Medicaid or  Health Choice, but were declined, whose parents can pay a reduced fee at time  of service.  [facility]  [address], [place] [phone] Patients are seen by appointment only. Walk-ins are not accepted. [facility] will see patients 22 years of age and older. Monday - Tuesday (8am-5pm) Most Wednesdays (8:30-5pm) $30 per visit, cash only  [facility]  [address], [facility] [place] [phone] Patients are seen by appointment only. Walk-ins are not accepted. [facility] will see patients 318 years of age and older. One Wednesday Evening (Monthly: Volunteer Based).  $30 per visit, cash only  [employer] [facility]  [phone] for adults; Children under age 744, call Graduate Pediatric Dentistry at [phone]. Children aged 784-14, please call [phone] to request a pediatric application.  Dental services are provided in all areas of dental care including fillings, crowns and bridges, complete and partial dentures, implants, gum treatment, root canals, and extractions. Preventive care is also provided. Treatment is provided to both adults and children. Patients are selected via a lottery and there is often a waiting list.   [facility] [address], [place]  [phone] [email or web address]    [facility] [address], [place], [place] [phone], Ext. [phone] Second and Fourth Thursday of each month, opens at 6:30 AM; Clinic ends at 9 AM.  Patients are seen on a first-come first-served basis, and a limited number are seen during each clinic.   [facility]  [address], [place], [place] [phone]) [phone]   Eligibility Requirements You must have lived in [place], [place], or [place] for at least the last three months.   You cannot be eligible for state or federal sponsored [place] Cityhealthcare insurance, including CIGNAVeterans Administration, [place]icaid, or [employer]Medicare.   You generally cannot be eligible for healthcare insurance through your employer.    How to apply: Eligibility screenings are held every Tuesday and Wednesday afternoon from 1:00 pm until 4:00 pm. You do not need an appointment for the interview!  [facility] [address], [place], [place] [phone]   [facility]  [phone]   [facility]  [phone]   [facility]  [phone]

## 2013-06-16 NOTE — ED Notes (Signed)
Pt discharged to home with family. NAD.  

## 2013-06-28 NOTE — ED Provider Notes (Signed)
Medical screening examination/treatment/procedure(s) were performed by non-physician practitioner and as supervising physician I was immediately available for consultation/collaboration.   EKG Interpretation None     - Grayden Burley, MD, FACEP   Dorman Calderwood L Modestine Scherzinger, MD 06/28/13 1505 

## 2014-04-24 ENCOUNTER — Encounter (HOSPITAL_COMMUNITY): Payer: Self-pay | Admitting: Emergency Medicine

## 2014-04-24 ENCOUNTER — Emergency Department (HOSPITAL_COMMUNITY)
Admission: EM | Admit: 2014-04-24 | Discharge: 2014-04-24 | Disposition: A | Discharge status: Home / Self Care | Payer: Self-pay | Attending: Emergency Medicine | Admitting: Emergency Medicine

## 2014-04-24 DIAGNOSIS — Z79899 Other long term (current) drug therapy: Secondary | ICD-10-CM | POA: Insufficient documentation

## 2014-04-24 DIAGNOSIS — J0100 Acute maxillary sinusitis, unspecified: Secondary | ICD-10-CM | POA: Insufficient documentation

## 2014-04-24 DIAGNOSIS — R197 Diarrhea, unspecified: Secondary | ICD-10-CM | POA: Insufficient documentation

## 2014-04-24 DIAGNOSIS — Z791 Long term (current) use of non-steroidal anti-inflammatories (NSAID): Secondary | ICD-10-CM | POA: Insufficient documentation

## 2014-04-24 DIAGNOSIS — I10 Essential (primary) hypertension: Secondary | ICD-10-CM | POA: Insufficient documentation

## 2014-04-24 DIAGNOSIS — R1013 Epigastric pain: Secondary | ICD-10-CM | POA: Insufficient documentation

## 2014-04-24 DIAGNOSIS — Z720 Tobacco use: Secondary | ICD-10-CM | POA: Insufficient documentation

## 2014-04-24 DIAGNOSIS — R112 Nausea with vomiting, unspecified: Secondary | ICD-10-CM | POA: Insufficient documentation

## 2014-04-24 LAB — COMPREHENSIVE METABOLIC PANEL
ALT: 14 U/L (ref 0–35)
ANION GAP: 8 (ref 5–15)
AST: 18 U/L (ref 0–37)
Albumin: 3.7 g/dL (ref 3.5–5.2)
Alkaline Phosphatase: 49 U/L (ref 39–117)
BUN: 11 mg/dL (ref 6–23)
CALCIUM: 8.6 mg/dL (ref 8.4–10.5)
CO2: 26 mmol/L (ref 19–32)
CREATININE: 0.72 mg/dL (ref 0.50–1.10)
Chloride: 103 mmol/L (ref 96–112)
GFR calc Af Amer: >90 mL/min (ref >90)
Glucose, Bld: 111 mg/dL — ABNORMAL HIGH (ref 70–99)
Potassium: 3.3 mmol/L — ABNORMAL LOW (ref 3.5–5.1)
Sodium: 137 mmol/L (ref 135–145)
TOTAL PROTEIN: 7.1 g/dL (ref 6.0–8.3)
Total Bilirubin: 0.6 mg/dL (ref 0.3–1.2)

## 2014-04-24 LAB — CBC WITH DIFFERENTIAL/PLATELET
Basophils Absolute: 0 10*3/uL (ref 0.0–0.1)
Basophils Relative: 0 % (ref 0–1)
Eosinophils Absolute: 0 10*3/uL (ref 0.0–0.7)
Eosinophils Relative: 0 % (ref 0–5)
HCT: 36.8 % (ref 36.0–46.0)
HEMOGLOBIN: 11.7 g/dL — AB (ref 12.0–15.0)
LYMPHS PCT: 21 % (ref 12–46)
Lymphs Abs: 1.3 10*3/uL (ref 0.7–4.0)
MCH: 29.2 pg (ref 26.0–34.0)
MCHC: 31.8 g/dL (ref 30.0–36.0)
MCV: 91.8 fL (ref 78.0–100.0)
MONOS PCT: 10 % (ref 3–12)
Monocytes Absolute: 0.6 10*3/uL (ref 0.1–1.0)
NEUTROS ABS: 4.1 10*3/uL (ref 1.7–7.7)
Neutrophils Relative %: 69 % (ref 43–77)
Platelets: 276 10*3/uL (ref 150–400)
RBC: 4.01 MIL/uL (ref 3.87–5.11)
RDW: 13.3 % (ref 11.5–15.5)
WBC: 5.9 10*3/uL (ref 4.0–10.5)

## 2014-04-24 LAB — I-STAT BETA HCG BLOOD, ED (MC, WL, AP ONLY)

## 2014-04-24 LAB — I-STAT CG4 LACTIC ACID, ED: LACTIC ACID, VENOUS: 0.77 mmol/L (ref 0.5–2.0)

## 2014-04-24 LAB — LIPASE, BLOOD: Lipase: 17 U/L (ref 11–59)

## 2014-04-24 MED ORDER — AMOXICILLIN 500 MG PO CAPS: 500.0000 mg | ORAL_CAPSULE | Freq: Three times a day (TID) | ORAL | Status: DC

## 2014-04-24 MED ORDER — PROMETHAZINE HCL 25 MG PO TABS: 25.0000 mg | ORAL_TABLET | Freq: Four times a day (QID) | ORAL | Status: DC | PRN

## 2014-04-24 MED ORDER — GI COCKTAIL ~~LOC~~
15.0000 mL | Freq: Three times a day (TID) | ORAL | Status: DC
Start: 2014-04-24 — End: 2014-06-06

## 2014-04-24 MED ORDER — HYDROCODONE-ACETAMINOPHEN 5-325 MG PO TABS
1.0000 | ORAL_TABLET | Freq: Once | ORAL | Status: AC
  Administered 2014-04-24: 1 via ORAL
  Filled 2014-04-24: qty 1

## 2014-04-24 MED ORDER — AMOXICILLIN 500 MG PO CAPS
500.0000 mg | ORAL_CAPSULE | Freq: Once | ORAL | Status: AC
  Administered 2014-04-24: 500 mg via ORAL
  Filled 2014-04-24: qty 1

## 2014-04-24 MED ORDER — ONDANSETRON 4 MG PO TBDP
4.0000 mg | ORAL_TABLET | Freq: Once | ORAL | Status: AC
  Administered 2014-04-24: 4 mg via ORAL
  Filled 2014-04-24: qty 1

## 2014-04-24 NOTE — ED Notes (Signed)
Pt unable to void at this time. 

## 2014-04-24 NOTE — Discharge Instructions (Signed)
Push fluids: take small frequent sips of water or Gatorade, do not drink any soda, juice or caffeinated beverages.    Slowly resume solid diet as desired. Avoid food that are spicy, contain dairy and/or have high fat content.  Aviod NSAIDs (aspirin, motrin, ibuprofen, naproxen, Aleve et Karie Soda) for pain control because they will irritate your stomach.  Use nasal saline (you can try Arm and Hammer Simply Saline) at least 4 times a day, use saline 5-10 minutes before using the fluticasone (flonase) nasal spray  Do not use Afrin (Oxymetazoline)  Rest, wash hands frequently  and drink plenty of water.  You may try counter medication such as Mucinex or Sudafed decongestant.  Return to the emergency room for severely worsening abdominal pain, abdominal pain that localizes to a particular area (especially the right lower part of the belly), pain that persists past 8-10 hours, blood in stool or vomit, severe weakness, fainting, or fever.   Do not hesitate to return to the emergency room for any new, worsening or concerning symptoms.  Please obtain primary care using resource guide below. But the minute you were seen in the emergency room and that they will need to obtain records for further outpatient management.    Emergency Department Resource Guide 1) Find a Doctor and Pay Out of Pocket Although you won't have to find out who is covered by your insurance plan, it is a good idea to ask around and get recommendations. You will then need to call the office and see if the doctor you have chosen will accept you as a new patient and what types of options they offer for patients who are self-pay. Some doctors offer discounts or will set up payment plans for their patients who do not have insurance, but you will need to ask so you aren't surprised when you get to your appointment.  2) Contact Your Local Health Department Not all health departments have doctors that can see patients for sick visits, but  many do, so it is worth a call to see if yours does. If you don't know where your local health department is, you can check in your phone book. The CDC also has a tool to help you locate your state's health department, and many state websites also have listings of all of their local health departments.  3) Find a Walk-in Clinic If your illness is not likely to be very severe or complicated, you may want to try a walk in clinic. These are popping up all over the country in pharmacies, drugstores, and shopping centers. They're usually staffed by nurse practitioners or physician assistants that have been trained to treat common illnesses and complaints. They're usually fairly quick and inexpensive. However, if you have serious medical issues or chronic medical problems, these are probably not your best option.  No Primary Care Doctor: - Call Health Connect at  [phone] - they can help you locate a primary care doctor that  accepts your insurance, provides certain services, etc. - Physician Referral Service- [phone]  Chronic Pain Problems: Organization         Address  Phone   Notes  [facility]  [phone] Patients need to be referred by their primary care doctor.   Medication Assistance: Organization         Address  Phone   Notes  [facility] Medication [facility] [address] [place], [place] [address] [phone] --Must be a resident of [facility] --  Must have NO insurance coverage whatsoever (no Medicaid/ Medicare, etc.) -- The pt. MUST have a primary care doctor that directs their care regularly and follows them in the community   [facility]ist  [phone]   [employer]  [phone]    Agencies that provide inexpensive medical care: Organization         Address  Phone   Notes  [facility]  [phone]) [phone]   [facility]    [phone]) [phone]   [facility] [address] [place], [place] [id number] [phone]) [phone]   [facility] [address], [place] [phone]) [phone]   Planned Parenthood    [phone](336) [phone]   [facility]    [phone]) [phone]   [facility]  [address], [place] Phone:  [phone]) [phone], Fax:  [phone] Hours of Operation:  9 am - 6 pm, M-F.  Also accepts Medicaid/Medicare and self-pay.  [facility]  [address], [place] Phone: [phone]) [phone], Fax: [phone]) [phone]. Hours of Operation:  8:30 am - 5:30 pm, M-F.  Also accepts Medicaid and self-pay.  [facility] [place] [address], [place] Phone: [phone]) [phone]   [facility] [address], [place], [place] [phone], Ext. [phone] Mondays & Thursdays: 7-9 AM.  First 15 patients are seen on a first come, first serve basis.    Medicaid-accepting [facility] Providers:  Organization         Address  Phone   Notes  [facility] [address], [place] [phone]) [phone] Also accepts self-pay patients.  [facility] [address], [place]  [phone]) [phone]   [facility] [address] [place] [address], [place] [phone]) [phone]   [facility] [address] [place] [address], [place] [phone]) [phone]   Renaye RakersVeita Bland [address], [place]   [phone]) [phone] Only accepts [place] Access [place] patients after they have their name applied to their card.   Self-Pay (no insurance) in [facility]:  Organization         Address  Phone   Notes  Sickle Cell Patients, [facility] [address], [place] [phone]) [phone]   [facility] [address], [place] [phone]) [phone]   [facility] [place]  [address],  [phone]) [phone]   [facility]/Dr. Osei-Bonsu  [address] [place] [address], [place] or  [phone], [place] [phone] Phone number for both [place] and [place] locations is the same.  [facility] [address], Road [place] [phone]   [facility] [address] [place] [address], [place] or [address] [phone]) [phone]) [phone]   [facility] [address], [place] [phone]) [phone], phone; [phone]) [phone], fax Sees patients 1st and 3rd Saturday of every month.  Must not qualify for public or private insurance (i.e. Medicaid, Medicare, [place] Health Choice, Veterans' Benefits)  Household income should be no more than 200% of the poverty level The clinic cannot treat you if you are pregnant or think you are pregnant  Sexually transmitted diseases are not treated at the clinic.    Dental Care: Organization         Address  Phone  Notes  [facility] [address] [place], [place] [phone] Accepts children up to age 22 who are enrolled in [place] or [place] Health Choice; pregnant women with a Medicaid card; and children who have applied for Medicaid or [place] Health Choice, but were declined, whose parents can pay a reduced fee at time of service.  [facility] of [facility]  [address], [place] [phone] Accepts children up to age 65 who are enrolled in [place] or [place] Health Choice; pregnant women with a Medicaid card; and children who have applied for Medicaid or [place] Health Choice, but were declined, whose parents can pay a reduced fee at time of service.  [facility]  [address] [place], [place] [phone] Patients are seen by appointment only. Walk-ins are not accepted. [facility] will see patients 1 years of age and older. Monday - Tuesday (8am-5pm) Most Wednesdays (8:30-5pm) $30 per visit, cash only  [facility] Access [facility]  [address], [facility] [phone] Patients are seen by appointment only. Walk-ins are not accepted. [facility] will see patients 49 years of age and older. One Wednesday Evening (Monthly: Volunteer Based).  $30 per visit, cash only  [employer]  [phone] for adults; Children under age 30, call Graduate Pediatric Dentistry at [phone]. Children aged 51-14, please call [phone] to request a pediatric application.  Dental services are provided in all areas of dental care including fillings, crowns and bridges, complete and partial dentures, implants, gum treatment, root canals, and extractions. Preventive care is also provided. Treatment is provided to both adults and children. Patients are selected via a lottery and there is often a waiting list.   [facility] [address] [place]  [phone] [email or web address]   [facility] [address] [place], [place] [phone], Ext. [phone] Second and Fourth Thursday of each month, opens at 6:30 AM; Clinic ends at 9 AM.  Patients are seen on a first-come first-served basis, and a limited number are seen during each clinic.   [facility]  [address] [place], [place] [phone]   Eligibility Requirements You must have lived in [place], [place], or [place] counties for at least the last three months.   You cannot be eligible for state or federal sponsored [place], including [employer], [place], or [employer].   You generally cannot be eligible for healthcare insurance through your employer.    How to apply: Eligibility screenings are held every Tuesday and Wednesday afternoon from 1:00 pm until 4:00 pm. You do not need an appointment for the interview!  [facility] [address] [place], [place] [phone]   [facility] Health Department  [phone]   [facility] Health Department  [phone]   [facility]  Health Department  [phone]    Behavioral Health Resources in the Community: Intensive Outpatient Programs Organization         Address  Phone  Notes  [facility] [address] [place], [place] [phone]   [facility] [address] [place], [place] [phone]   [facility] [address] [place], [place]  [phone]   [facility] Health [address]  [place], [place] [phone] or [phone]   Substance Abuse Resources Organization  Address  Phone  Notes  [facility] Drug [facility]  ([phone]   [facility]  [phone]   [facility] [place]  [phone]   Floydene Flock  [phone]   Residential & Outpatient Substance Abuse Program  [phone]   Psychological Services Organization         Address  Phone  Notes  [facility] Behavioral Health  [phone]   [facility] Services  [phone]   [facility] Mental Health [address], [place] [phone] or [phone]    Mobile Crisis Teams Organization         Address  Phone  Notes  [facility], Mobile Crisis Care Unit  484-733-2008   [facility]  [address] [place], [place] [phone]   Doristine Locks [address] [place] [phone]    Self-Help/Support Groups Organization         Address  Phone             Notes  [facility] [place] - variety of support groups  [phone]- I7437963 Call for more information  Narcotics Anonymous (NA), [facility] [address], [employer] [place]  2 meetings at this location   Statistician         Address  Phone  Notes  [facility] [address],    [place]  [phone]   [facility]  [address], [place] [address], [place], [place] [phone]   [facility] [address] [place], [place] [phone]  Admissions: 8am-3pm M-F  [facility] [address],    [place], [place] [phone]   [facility] [address] [place], [place], [place] [phone]   [facility] [address]  [place], [place] [phone]   [facility] - Intensive Outpatient [address], [place], [place] [phone]   [facility] ([facility] [address]  [place], [place] [phone] or [phone]   [facility] ([facility]) [address], [place], [place] [phone] Accepts Medicaid  [facility] [address]  [place] [phone] Substance Abuse/Addiction Treatment   [facility] Organization         Address  Phone  Notes  [facility]  [phone]   Angie Fava, PhD [address] [place], [place]   [phone] or [phone]   [facility]al   [address] [place], [place] [phone]   [facility] [address], [place], [place] [phone] Insurance/Medicaid/sponsorship through [facility] and Families [address]                                    [place], [place] [phone] Therapy/tele-psych/case  [facility] [address], [place] [phone]    Dr. Lolly Mustache  [phone]   [facility]  [employer] [facility] Dept. 1) [address] [place] 2) [address] [place] [address], [place] 3)  [address], [place] [phone]  [phone]   [facility] Child Abuse Hotline 907-201-1259 or [phone] (After Hours)

## 2014-04-24 NOTE — ED Notes (Signed)
Pt reports she has been having sinus congestion. Also having bilateral ear pain with itching. Was eating last night and says she "felt her stomach turn and I had diarrhea, then when I woke up this morning I took my temperature it was 101." Has been vomiting-last occurrence was 0730 this morning. Took Ibuprofen around 1030. Having generalized abdominal pain with tenderness. RR even/unlabored. No other c/c.

## 2014-04-24 NOTE — ED Notes (Signed)
Pt given water with ice for fluid challenge.

## 2014-04-24 NOTE — ED Provider Notes (Signed)
CSN: [id number]     Arrival date & time 04/24/14  1706 History   First MD Initiated Contact with Patient 04/24/14 1858     Chief Complaint  Patient presents with  . Abdominal Pain  . Otalgia  . Diarrhea  . Emesis     (Consider location/radiation/quality/duration/timing/severity/associated sxs/prior Treatment) HPI   Kayla Flynn is a 24 y.o. female complaining of sinus pressure, productive cough, greenish nasal discharge with bilateral ear pressure and itching worsening over the course of a week. Patient also reports that she went out to eat at [employer] for her dinner last night came home and had several episodes of nonbloody, non-bilious, coffee-ground emesis last night. Today she reports multiple episodes of loose stool. She denies any sick contacts, nobody else was affected at 8 at the restaurant with her last night. 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 She reports 5 out of 10 epigastric abdominal discomfort which she describes as colicky. She had a fever of 101 this morning. She took Motrin at 10:30 AM with good relief. She denies chest pain, shortness of breath, myalgia, sore throat. Been tolerating by mouth today and drinking well she ate a bagel earlier.  Past Medical History  Diagnosis Date  . Hypertension   . Obesity   . Prediabetes     "prediabetic; controlled w/diet" (07/17/2012)  . Acute appendicitis 07/18/2012  . Obesity, morbid, BMI 50 or higher 07/18/2012   Past Surgical History  Procedure Laterality Date  . Appendectomy  07/17/2012  . Laparoscopic appendectomy N/A 07/17/2012    Procedure: APPENDECTOMY LAPAROSCOPIC;  Surgeon: Liz Malady, MD;  Location: [facility] OR;  Service: General;  Laterality: N/A;   History reviewed. No pertinent family history. History  Substance Use Topics  . Smoking status: Current Every Day Smoker -- 0.10 packs/day for 1.2 years    Types: Cigarettes  . Smokeless tobacco: Never Used  . Alcohol Use: No     Comment: 07/17/2012 "mixed drink  once/month"- stopped smoking 07/15/2012   OB History    No data available     Review of Systems  10 systems reviewed and found to be negative, except as noted in the HPI.   Allergies  Review of patient's allergies indicates no known allergies.  Home Medications   Prior to Admission medications   Medication Sig Start Date End Date Taking? Authorizing Provider  ibuprofen (ADVIL,MOTRIN) 200 MG tablet Take 600 mg by mouth every 6 (six) hours as needed for fever (fever).   Yes Historical Provider, MD  oxymetazoline (AFRIN) 0.05 % nasal spray Place 2 sprays into both nostrils 2 (two) times daily as needed for congestion (congestion).   Yes Historical Provider, MD  Alum & Mag Hydroxide-Simeth (GI COCKTAIL) SUSP suspension Take 15 mLs by mouth 3 (three) times daily. Shake well. 04/24/14   Sagan Maselli, PA-C  amoxicillin (AMOXIL) 500 MG capsule Take 1 capsule (500 mg total) by mouth 3 (three) times daily. 04/24/14   Ihor Meinzer, PA-C  diphenoxylate-atropine (LOMOTIL) 2.5-0.025 MG per tablet Take 1 tablet by mouth 4 (four) times daily as needed for diarrhea or loose stools. Patient not taking: Reported on 04/24/2014 01/28/13   Reuben Likes, MD  naproxen (NAPROSYN) 500 MG tablet Take 1 tablet (500 mg total) by mouth 2 (two) times daily. Patient not taking: Reported on 04/24/2014 06/16/13   Junius Finner, PA-C  ondansetron (ZOFRAN ODT) 8 MG disintegrating tablet Take 1 tablet (8 mg total) by mouth every 8 (eight) hours as needed for nausea.  Patient not taking: Reported on 04/24/2014 01/28/13   Reuben Likesavid C Keller, MD  oxyCODONE-acetaminophen (PERCOCET/ROXICET) 5-325 MG per tablet Take 1-2 tablets by mouth every 4 (four) hours as needed for severe pain. Patient not taking: Reported on 04/24/2014 06/16/13   Junius FinnerErin O'Malley, PA-C  penicillin v potassium (VEETID) 500 MG tablet Take 1 tablet (500 mg total) by mouth 4 (four) times daily. Patient not taking: Reported on 04/24/2014 06/16/13   Junius FinnerErin O'Malley, PA-C   promethazine (PHENERGAN) 25 MG tablet Take 1 tablet (25 mg total) by mouth every 6 (six) hours as needed for nausea or vomiting. 04/24/14   Joni ReiningNicole Nolan Tuazon, PA-C   BP 138/70 mmHg  Pulse 73  Temp(Src) 98 F (36.7 C) (Oral)  Resp 18  SpO2 100%  LMP 01/22/2014 (Approximate) Physical Exam  Constitutional: She is oriented to person, place, and time. She appears well-developed and well-nourished. No distress.  HENT:  Head: Normocephalic and atraumatic.  Mouth/Throat: Oropharynx is clear and moist.  Posterior pharynx is injected with cobblestoning, uvula is midline and soft palate rises symmetrically. There is no significant tonsillar hypertrophy or exudate. Patient has rhinorrhea with mucosal edema, she is tender to palpation along the maxillary and frontal sinuses. Bilateral tympanic membranes with normal architecture and good light reflex.  Eyes: Conjunctivae and EOM are normal. Pupils are equal, round, and reactive to light.  Neck: Normal range of motion. Neck supple.  No midline C-spine  tenderness to palpation or step-offs appreciated. Patient has full range of motion without pain.  Cardiovascular: Normal rate, regular rhythm and intact distal pulses.   Pulmonary/Chest: Effort normal and breath sounds normal. No stridor. No respiratory distress. She has no wheezes. She has no rales. She exhibits no tenderness.  Abdominal: Soft. Bowel sounds are normal. She exhibits no distension and no mass. There is no tenderness. There is no rebound and no guarding.  Musculoskeletal: Normal range of motion.  Neurological: She is alert and oriented to person, place, and time.  Psychiatric: She has a normal mood and affect.  Nursing note and vitals reviewed.   ED Course  Procedures (including critical care time) Labs Review Labs Reviewed  CBC WITH DIFFERENTIAL/PLATELET - Abnormal; Notable for the following:    Hemoglobin 11.7 (*)    All other components within normal limits  COMPREHENSIVE METABOLIC  PANEL - Abnormal; Notable for the following:    Potassium 3.3 (*)    Glucose, Bld 111 (*)    All other components within normal limits  LIPASE, BLOOD  I-STAT CG4 LACTIC ACID, ED  I-STAT BETA HCG BLOOD, ED ([facility], [facility], [facility] ONLY)    Imaging Review No results found.   EKG Interpretation None      MDM   Final diagnoses:  Acute maxillary sinusitis, recurrence not specified  Nausea vomiting and diarrhea  Epigastric abdominal pain    Filed Vitals:   04/24/14 1722 04/24/14 2010  BP: 140/74 138/70  Pulse: 102 73  Temp: 98 F (36.7 C)   TempSrc: Oral   Resp: 17 18  SpO2: 100% 100%    Medications  HYDROcodone-acetaminophen (NORCO/VICODIN) 5-325 MG per tablet 1 tablet (1 tablet Oral Given 04/24/14 1919)  amoxicillin (AMOXIL) capsule 500 mg (500 mg Oral Given 04/24/14 2012)  ondansetron (ZOFRAN-ODT) disintegrating tablet 4 mg (4 mg Oral Given 04/24/14 2012)    Thurnell Garbebony Bieler is a pleasant 24 y.o. female presentingnausea vomiting diarrhea and mild epigastric tenderness. Abdominal exam is completely benign with no tenderness to deep palpation of any quadrant. Patient is tolerating  by mouth. She also reports rhinorrhea him a productive cough and maxillary pressure. Lung sounds are clear to auscultation, patient is saturating perfectly on room air. I doubt this is a pneumonia, more likely postnasal drip. Patient will be by mouth challenged, will discharge her home with pain medication in addition to nausea medicine and amoxicillin for sinusitis.  Evaluation does not show pathology that would require ongoing emergent intervention or inpatient treatment. Pt is hemodynamically stable and mentating appropriately. Discussed findings and plan with patient/guardian, who agrees with care plan. All questions answered. Return precautions discussed and outpatient follow up given.   Discharge Medication List as of 04/24/2014  7:30 PM    START taking these medications   Details  Alum & Mag Hydroxide-Simeth  (GI COCKTAIL) SUSP suspension Take 15 mLs by mouth 3 (three) times daily. Shake well., Starting 04/24/2014, Until Discontinued, Print    amoxicillin (AMOXIL) 500 MG capsule Take 1 capsule (500 mg total) by mouth 3 (three) times daily., Starting 04/24/2014, Until Discontinued, Print    promethazine (PHENERGAN) 25 MG tablet Take 1 tablet (25 mg total) by mouth every 6 (six) hours as needed for nausea or vomiting., Starting 04/24/2014, Until Discontinued, [employer], PA-C 04/25/14 0230  Purvis Sheffield, MD 04/25/14 1152

## 2014-05-15 ENCOUNTER — Encounter (HOSPITAL_COMMUNITY): Payer: Self-pay | Admitting: Emergency Medicine

## 2014-05-15 ENCOUNTER — Emergency Department (HOSPITAL_COMMUNITY)
Admission: EM | Admit: 2014-05-15 | Discharge: 2014-05-15 | Disposition: A | Discharge status: Home / Self Care | Payer: Self-pay | Attending: Emergency Medicine | Admitting: Emergency Medicine

## 2014-05-15 DIAGNOSIS — H6983 Other specified disorders of Eustachian tube, bilateral: Secondary | ICD-10-CM | POA: Insufficient documentation

## 2014-05-15 DIAGNOSIS — Z792 Long term (current) use of antibiotics: Secondary | ICD-10-CM | POA: Insufficient documentation

## 2014-05-15 DIAGNOSIS — H6993 Unspecified Eustachian tube disorder, bilateral: Secondary | ICD-10-CM

## 2014-05-15 DIAGNOSIS — J069 Acute upper respiratory infection, unspecified: Secondary | ICD-10-CM

## 2014-05-15 DIAGNOSIS — Z79899 Other long term (current) drug therapy: Secondary | ICD-10-CM | POA: Insufficient documentation

## 2014-05-15 DIAGNOSIS — Z8719 Personal history of other diseases of the digestive system: Secondary | ICD-10-CM | POA: Insufficient documentation

## 2014-05-15 DIAGNOSIS — I10 Essential (primary) hypertension: Secondary | ICD-10-CM | POA: Insufficient documentation

## 2014-05-15 DIAGNOSIS — Z720 Tobacco use: Secondary | ICD-10-CM | POA: Insufficient documentation

## 2014-05-15 LAB — RAPID STREP SCREEN (MED CTR MEBANE ONLY): Streptococcus, Group A Screen (Direct): NEGATIVE

## 2014-05-15 MED ORDER — NAPROXEN 500 MG PO TABS: 500.0000 mg | ORAL_TABLET | Freq: Two times a day (BID) | ORAL | Status: DC

## 2014-05-15 MED ORDER — CETIRIZINE HCL 10 MG PO TABS: 10.0000 mg | ORAL_TABLET | Freq: Every day | ORAL | Status: DC

## 2014-05-15 MED ORDER — DEXAMETHASONE SODIUM PHOSPHATE 10 MG/ML IJ SOLN
10.0000 mg | Freq: Once | INTRAMUSCULAR | Status: AC
Start: 2014-05-15 — End: 2014-05-15
  Administered 2014-05-15: 10 mg via INTRAMUSCULAR
  Filled 2014-05-15: qty 1

## 2014-05-15 MED ORDER — DM-GUAIFENESIN ER 30-600 MG PO TB12: 1.0000 | ORAL_TABLET | Freq: Two times a day (BID) | ORAL | Status: DC

## 2014-05-15 MED ORDER — ACETAMINOPHEN 325 MG PO TABS
650.0000 mg | ORAL_TABLET | Freq: Once | ORAL | Status: AC
Start: 2014-05-15 — End: 2014-05-15
  Administered 2014-05-15: 650 mg via ORAL
  Filled 2014-05-15: qty 2

## 2014-05-15 NOTE — ED Notes (Signed)
Pt reports 1 week hx of increasing sore throat and bilateral ear pain. Tx with OTC meds. Denies fever

## 2014-05-15 NOTE — ED Provider Notes (Signed)
CSN: [id number]     Arrival date & time 05/15/14  1003 History   First MD Initiated Contact with Patient 05/15/14 1021     Chief Complaint  Patient presents with  . Sore Throat    x 1 week  . Otalgia    Kayla Flynn is a 24 y.o. female who presents the emergency department complaining of 1 week of sore throat, nasal congestion, and postnasal drip. She also reports bilateral ear pain and pressure. She also reports 1 week of cough from her drainage from the back of her throat. 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 Patient rates her pain at 8 out of 10 and reports it's worse in the morning. She reports it hurts to swallow but has had no difficulty swallowing. The patient denies fevers, abdominal pain, nausea, vomiting, swallowing, shortness of breath, wheezing,  rashes, or recent antibiotic use.  (Consider location/radiation/quality/duration/timing/severity/associated sxs/prior Treatment) HPI  Past Medical History  Diagnosis Date  . Hypertension   . Obesity   . Prediabetes     "prediabetic; controlled w/diet" (07/17/2012)  . Acute appendicitis 07/18/2012  . Obesity, morbid, BMI 50 or higher 07/18/2012   Past Surgical History  Procedure Laterality Date  . Appendectomy  07/17/2012  . Laparoscopic appendectomy N/A 07/17/2012    Procedure: APPENDECTOMY LAPAROSCOPIC;  Surgeon: Liz Malady, MD;  Location: [facility] OR;  Service: General;  Laterality: N/A;   Family History  Problem Relation Age of Onset  . Hypertension Mother    History  Substance Use Topics  . Smoking status: Current Every Day Smoker -- 0.10 packs/day for 1.2 years    Types: Cigarettes  . Smokeless tobacco: Never Used  . Alcohol Use: Yes   OB History    No data available     Review of Systems  Constitutional: Negative for fever and appetite change.  HENT: Positive for congestion, ear pain, rhinorrhea, sneezing and sore throat. Negative for ear discharge, facial swelling, mouth sores, tinnitus and trouble swallowing.    Eyes: Negative for pain and redness.  Respiratory: Positive for cough. Negative for shortness of breath and wheezing.   Cardiovascular: Negative for chest pain and palpitations.  Gastrointestinal: Negative for nausea, vomiting, abdominal pain and diarrhea.  Musculoskeletal: Negative for myalgias and back pain.  Skin: Negative for rash.  Neurological: Negative for weakness, light-headedness and headaches.      Allergies  Review of patient's allergies indicates no known allergies.  Home Medications   Prior to Admission medications   Medication Sig Start Date End Date Taking? Authorizing Provider  Alum & Mag Hydroxide-Simeth (GI COCKTAIL) SUSP suspension Take 15 mLs by mouth 3 (three) times daily. Shake well. 04/24/14   Nicole Pisciotta, PA-C  amoxicillin (AMOXIL) 500 MG capsule Take 1 capsule (500 mg total) by mouth 3 (three) times daily. 04/24/14   Nicole Pisciotta, PA-C  cetirizine (ZYRTEC ALLERGY) 10 MG tablet Take 1 tablet (10 mg total) by mouth daily. 05/15/14   Kayla Farrier, PA-C  dextromethorphan-guaiFENesin (MUCINEX DM) 30-600 MG per 12 hr tablet Take 1 tablet by mouth 2 (two) times daily. 05/15/14   Kayla Farrier, PA-C  diphenoxylate-atropine (LOMOTIL) 2.5-0.025 MG per tablet Take 1 tablet by mouth 4 (four) times daily as needed for diarrhea or loose stools. Patient not taking: Reported on 04/24/2014 01/28/13   Reuben Likes, MD  ibuprofen (ADVIL,MOTRIN) 200 MG tablet Take 600 mg by mouth every 6 (six) hours as needed for fever (fever).    Historical Provider, MD  naproxen (NAPROSYN) 500  MG tablet Take 1 tablet (500 mg total) by mouth 2 (two) times daily with a meal. 05/15/14   Kayla Farrier, PA-C  ondansetron (ZOFRAN ODT) 8 MG disintegrating tablet Take 1 tablet (8 mg total) by mouth every 8 (eight) hours as needed for nausea. Patient not taking: Reported on 04/24/2014 01/28/13   Reuben Likes, MD  oxyCODONE-acetaminophen (PERCOCET/ROXICET) 5-325 MG per tablet Take 1-2 tablets by  mouth every 4 (four) hours as needed for severe pain. Patient not taking: Reported on 04/24/2014 06/16/13   Junius Finner, PA-C  oxymetazoline (AFRIN) 0.05 % nasal spray Place 2 sprays into both nostrils 2 (two) times daily as needed for congestion (congestion).    Historical Provider, MD  penicillin v potassium (VEETID) 500 MG tablet Take 1 tablet (500 mg total) by mouth 4 (four) times daily. Patient not taking: Reported on 04/24/2014 06/16/13   Junius Finner, PA-C  promethazine (PHENERGAN) 25 MG tablet Take 1 tablet (25 mg total) by mouth every 6 (six) hours as needed for nausea or vomiting. 04/24/14   Joni Reining Pisciotta, PA-C   BP 117/61 mmHg  Pulse 95  Temp(Src) 98.7 F (37.1 C) (Oral)  Wt 336 lb (152.409 kg)  SpO2 97%  LMP 01/10/2014 Physical Exam  Constitutional: She appears well-developed and well-nourished. No distress.  HENT:  Head: Normocephalic and atraumatic.  Right Ear: External ear normal.  Left Ear: External ear normal.  Nose: Nose normal.  Mouth/Throat: Oropharynx is clear and moist. No oropharyngeal exudate.  Mild tonsillar hypertrophy without exudate. Uvula is midline without edema or erythema. There is no evidence of peritonsillar abscess.  Bilateral tympanic membranes are pearly-gray without erythema or loss of landmarks. There is some cleatr middle ear fluid.   Eyes: Conjunctivae are normal. Pupils are equal, round, and reactive to light. Right eye exhibits no discharge. Left eye exhibits no discharge.  Neck: Normal range of motion. Neck supple. No JVD present.  Cardiovascular: Normal rate, regular rhythm, normal heart sounds and intact distal pulses.  Exam reveals no gallop and no friction rub.   No murmur heard. Pulmonary/Chest: Effort normal and breath sounds normal. No respiratory distress. She has no wheezes. She has no rales.  Abdominal: Soft. She exhibits no distension. There is no tenderness.  Musculoskeletal: She exhibits no edema.  Lymphadenopathy:    She has  no cervical adenopathy.  Neurological: She is alert. Coordination normal.  Skin: Skin is warm and dry. No rash noted. She is not diaphoretic. No erythema. No pallor.  Psychiatric: She has a normal mood and affect. Her behavior is normal.  Nursing note and vitals reviewed.   ED Course  Procedures (including critical care time) Labs Review Labs Reviewed  RAPID STREP SCREEN  CULTURE, GROUP A STREP    Imaging Review No results found.   EKG Interpretation None      Filed Vitals:   05/15/14 1014  BP: 117/61  Pulse: 95  Temp: 98.7 F (37.1 C)  TempSrc: Oral  Weight: 336 lb (152.409 kg)  SpO2: 97%     MDM   Meds given in ED:  Medications  acetaminophen (TYLENOL) tablet 650 mg (650 mg Oral Given 05/15/14 1114)  dexamethasone (DECADRON) injection 10 mg (10 mg Intramuscular Given 05/15/14 1114)    New Prescriptions   CETIRIZINE (ZYRTEC ALLERGY) 10 MG TABLET    Take 1 tablet (10 mg total) by mouth daily.   DEXTROMETHORPHAN-GUAIFENESIN (MUCINEX DM) 30-600 MG PER 12 HR TABLET    Take 1 tablet by mouth 2 (two)  times daily.   NAPROXEN (NAPROSYN) 500 MG TABLET    Take 1 tablet (500 mg total) by mouth 2 (two) times daily with a meal.    Final diagnoses:  URI (upper respiratory infection)  Eustachian tube dysfunction, bilateral   Kayla Flynn is a 24 y.o. female who presents the emergency department complaining of 1 week of sore throat, nasal congestion, and postnasal drip. She also reports bilateral ear pain and pressure. She denies fevers, shortness or breath or wheezing. She is afebrile and nontoxic appearing. His bilateral eustachian tube dysfunction with clear inner ear fluid without erythema or loss of landmarks. Her lungs are clear to ascultation bilaterally. She has mild tonsillar hypertrophy without exudates. Rapid strep is negative. Symptoms are consistent with upper respiratory infection. Patient given decadron IM and tylenol in the ED. Provided rx for zyrtec, Mucinex and  naprosyn. I advised the patient to follow-up with their primary care provider this week. I advised the patient to return to the emergency department with new or worsening symptoms or new concerns. The patient verbalized understanding and agreement with plan.      Kayla FarrierWilliam Vennesa Bastedo, PA-C 05/15/14 1120  Samuel JesterKathleen McManus, DO 05/18/14 [id number]

## 2014-05-15 NOTE — Discharge Instructions (Signed)
Upper Respiratory Infection, Adult °An upper respiratory infection (URI) is also sometimes known as the common cold. The upper respiratory tract includes the nose, sinuses, throat, trachea, and bronchi. Bronchi are the airways leading to the lungs. Most people improve within 1 week, but symptoms can last up to 2 weeks. A residual cough may last even longer.  °CAUSES °Many different viruses can infect the tissues lining the upper respiratory tract. The tissues become irritated and inflamed and often become very moist. Mucus production is also common. A cold is contagious. You can easily spread the virus to others by oral contact. This includes kissing, sharing a glass, coughing, or sneezing. Touching your mouth or nose and then touching a surface, which is then touched by another person, can also spread the virus. °SYMPTOMS  °Symptoms typically develop 1 to 3 days after you come in contact with a cold virus. Symptoms vary from person to person. They may include: °· Runny nose. °· Sneezing. °· Nasal congestion. °· Sinus irritation. °· Sore throat. °· Loss of voice (laryngitis). °· Cough. °· Fatigue. °· Muscle aches. °· Loss of appetite. °· Headache. °· Low-grade fever. °DIAGNOSIS  °You might diagnose your own cold based on familiar symptoms, since most people get a cold 2 to 3 times a year. Your caregiver can confirm this based on your exam. Most importantly, your caregiver can check that your symptoms are not due to another disease such as strep throat, sinusitis, pneumonia, asthma, or epiglottitis. Blood tests, throat tests, and X-rays are not necessary to diagnose a common cold, but they may sometimes be helpful in excluding other more serious diseases. Your caregiver will decide if any further tests are required. °RISKS AND COMPLICATIONS  °You may be at risk for a more severe case of the common cold if you smoke cigarettes, have chronic heart disease (such as heart failure) or lung disease (such as asthma), or if  you have a weakened immune system. The very young and very old are also at risk for more serious infections. Bacterial sinusitis, middle ear infections, and bacterial pneumonia can complicate the common cold. The common cold can worsen asthma and chronic obstructive pulmonary disease (COPD). Sometimes, these complications can require emergency medical care and may be life-threatening. °PREVENTION  °The best way to protect against getting a cold is to practice good hygiene. Avoid oral or hand contact with people with cold symptoms. Wash your hands often if contact occurs. There is no clear evidence that vitamin C, vitamin E, echinacea, or exercise reduces the chance of developing a cold. However, it is always recommended to get plenty of rest and practice good nutrition. °TREATMENT  °Treatment is directed at relieving symptoms. There is no cure. Antibiotics are not effective, because the infection is caused by a virus, not by bacteria. Treatment may include: °· Increased fluid intake. Sports drinks offer valuable electrolytes, sugars, and fluids. °· Breathing heated mist or steam (vaporizer or shower). °· Eating chicken soup or other clear broths, and maintaining good nutrition. °· Getting plenty of rest. °· Using gargles or lozenges for comfort. °· Controlling fevers with ibuprofen or acetaminophen as directed by your caregiver. °· Increasing usage of your inhaler if you have asthma. °Zinc gel and zinc lozenges, taken in the first 24 hours of the common cold, can shorten the duration and lessen the severity of symptoms. Pain medicines may help with fever, muscle aches, and throat pain. A variety of non-prescription medicines are available to treat congestion and runny nose. Your caregiver   can make recommendations and may suggest nasal or lung inhalers for other symptoms.  °HOME CARE INSTRUCTIONS  °· Only take over-the-counter or prescription medicines for pain, discomfort, or fever as directed by your  caregiver. °· Use a warm mist humidifier or inhale steam from a shower to increase air moisture. This may keep secretions moist and make it easier to breathe. °· Drink enough water and fluids to keep your urine clear or pale yellow. °· Rest as needed. °· Return to work when your temperature has returned to normal or as your caregiver advises. You may need to stay home longer to avoid infecting others. You can also use a face mask and careful hand washing to prevent spread of the virus. °SEEK MEDICAL CARE IF:  °· After the first few days, you feel you are getting worse rather than better. °· You need your caregiver's advice about medicines to control symptoms. °· You develop chills, worsening shortness of breath, or brown or red sputum. These may be signs of pneumonia. °· You develop yellow or brown nasal discharge or pain in the face, especially when you bend forward. These may be signs of sinusitis. °· You develop a fever, swollen neck glands, pain with swallowing, or white areas in the back of your throat. These may be signs of strep throat. °SEEK IMMEDIATE MEDICAL CARE IF:  °· You have a fever. °· You develop severe or persistent headache, ear pain, sinus pain, or chest pain. °· You develop wheezing, a prolonged cough, cough up blood, or have a change in your usual mucus (if you have chronic lung disease). °· You develop sore muscles or a stiff neck. °Document Released: 08/17/2000 Document Revised: 05/16/2011 Document Reviewed: 05/29/2013 °ExitCare® Patient Information ©2015 [employer]. This information is not intended to replace advice given to you by your health care provider. Make sure you discuss any questions you have with your health care provider. °Barotitis Media °Barotitis media is inflammation of your middle ear. This occurs when the auditory tube (eustachian tube) leading from the back of your nose (nasopharynx) to your eardrum is blocked. This blockage may result from a cold, environmental  allergies, or an upper respiratory infection. Unresolved barotitis media may lead to damage or hearing loss (barotrauma), which may become permanent. °HOME CARE INSTRUCTIONS  °· Use medicines as recommended by your health care provider. Over-the-counter medicines will help unblock the canal and can help during times of air travel. °· Do not put anything into your ears to clean or unplug them. Eardrops will not be helpful. °· Do not swim, dive, or fly until your health care provider says it is all right to do so. If these activities are necessary, chewing gum with frequent, forceful swallowing may help. It is also helpful to hold your nose and gently blow to pop your ears for equalizing pressure changes. This forces air into the eustachian tube. °· Only take over-the-counter or prescription medicines for pain, discomfort, or fever as directed by your health care provider. °· A decongestant may be helpful in decongesting the middle ear and make pressure equalization easier. °SEEK MEDICAL CARE IF: °· You experience a serious form of dizziness in which you feel as if the room is spinning and you feel nauseated (vertigo). °· Your symptoms only involve one ear. °SEEK IMMEDIATE MEDICAL CARE IF:  °· You develop a severe headache, dizziness, or severe ear pain. °· You have bloody or pus-like drainage from your ears. °· You develop a fever. °· Your problems do   not improve or become worse. MAKE SURE YOU:   Understand these instructions.  Will watch your condition.  Will get help right away if you are not doing well or get worse. Document Released: 02/19/2000 Document Revised: 12/12/2012 Document Reviewed: 09/18/2012 [facility] Patient Information 2015 [place], [place]. This information is not intended to replace advice given to you by your health care provider. Make sure you discuss any questions you have with your health care provider.  Cool Mist Vaporizers Vaporizers may help relieve the symptoms of a cough and  cold. They add moisture to the air, which helps mucus to become thinner and less sticky. This makes it easier to breathe and cough up secretions. Cool mist vaporizers do not cause serious burns like hot mist vaporizers, which may also be called steamers or humidifiers. Vaporizers have not been proven to help with colds. You should not use a vaporizer if you are allergic to mold. HOME CARE INSTRUCTIONS  Follow the package instructions for the vaporizer.  Do not use anything other than distilled water in the vaporizer.  Do not run the vaporizer all of the time. This can cause mold or bacteria to grow in the vaporizer.  Clean the vaporizer after each time it is used.  Clean and dry the vaporizer well before storing it.  Stop using the vaporizer if worsening respiratory symptoms develop. Document Released: 11/19/2003 Document Revised: 02/26/2013 Document Reviewed: 07/11/2012 [facility] Patient Information 2015 [place], [place]. This information is not intended to replace advice given to you by your health care provider. Make sure you discuss any questions you have with your health care provider.

## 2014-05-17 LAB — CULTURE, GROUP A STREP: Strep A Culture: NEGATIVE

## 2014-05-21 ENCOUNTER — Emergency Department (HOSPITAL_COMMUNITY): Payer: Self-pay

## 2014-05-21 ENCOUNTER — Encounter (HOSPITAL_COMMUNITY): Payer: Self-pay | Admitting: Emergency Medicine

## 2014-05-21 ENCOUNTER — Emergency Department (HOSPITAL_COMMUNITY)
Admission: EM | Admit: 2014-05-21 | Discharge: 2014-05-21 | Disposition: A | Discharge status: Home / Self Care | Payer: Self-pay | Attending: Emergency Medicine | Admitting: Emergency Medicine

## 2014-05-21 DIAGNOSIS — R1084 Generalized abdominal pain: Secondary | ICD-10-CM | POA: Insufficient documentation

## 2014-05-21 DIAGNOSIS — I10 Essential (primary) hypertension: Secondary | ICD-10-CM | POA: Insufficient documentation

## 2014-05-21 DIAGNOSIS — Z3201 Encounter for pregnancy test, result positive: Secondary | ICD-10-CM | POA: Insufficient documentation

## 2014-05-21 DIAGNOSIS — Z791 Long term (current) use of non-steroidal anti-inflammatories (NSAID): Secondary | ICD-10-CM | POA: Insufficient documentation

## 2014-05-21 DIAGNOSIS — Z331 Pregnant state, incidental: Secondary | ICD-10-CM | POA: Insufficient documentation

## 2014-05-21 DIAGNOSIS — Z720 Tobacco use: Secondary | ICD-10-CM | POA: Insufficient documentation

## 2014-05-21 DIAGNOSIS — Z792 Long term (current) use of antibiotics: Secondary | ICD-10-CM | POA: Insufficient documentation

## 2014-05-21 DIAGNOSIS — Z79899 Other long term (current) drug therapy: Secondary | ICD-10-CM | POA: Insufficient documentation

## 2014-05-21 DIAGNOSIS — R197 Diarrhea, unspecified: Secondary | ICD-10-CM | POA: Insufficient documentation

## 2014-05-21 DIAGNOSIS — Z8719 Personal history of other diseases of the digestive system: Secondary | ICD-10-CM | POA: Insufficient documentation

## 2014-05-21 DIAGNOSIS — R112 Nausea with vomiting, unspecified: Secondary | ICD-10-CM | POA: Insufficient documentation

## 2014-05-21 LAB — CBC WITH DIFFERENTIAL/PLATELET
Basophils Absolute: 0 10*3/uL (ref 0.0–0.1)
Basophils Relative: 0 % (ref 0–1)
EOS ABS: 0 10*3/uL (ref 0.0–0.7)
EOS PCT: 0 % (ref 0–5)
HCT: 36.5 % (ref 36.0–46.0)
HEMOGLOBIN: 11.7 g/dL — AB (ref 12.0–15.0)
Lymphocytes Relative: 4 % — ABNORMAL LOW (ref 12–46)
Lymphs Abs: 0.5 10*3/uL — ABNORMAL LOW (ref 0.7–4.0)
MCH: 29.1 pg (ref 26.0–34.0)
MCHC: 32.1 g/dL (ref 30.0–36.0)
MCV: 90.8 fL (ref 78.0–100.0)
MONOS PCT: 4 % (ref 3–12)
Monocytes Absolute: 0.5 10*3/uL (ref 0.1–1.0)
Neutro Abs: 11.1 10*3/uL — ABNORMAL HIGH (ref 1.7–7.7)
Neutrophils Relative %: 92 % — ABNORMAL HIGH (ref 43–77)
PLATELETS: 357 10*3/uL (ref 150–400)
RBC: 4.02 MIL/uL (ref 3.87–5.11)
RDW: 13.5 % (ref 11.5–15.5)
WBC: 12 10*3/uL — ABNORMAL HIGH (ref 4.0–10.5)

## 2014-05-21 LAB — COMPREHENSIVE METABOLIC PANEL
ALBUMIN: 3.9 g/dL (ref 3.5–5.2)
ALK PHOS: 55 U/L (ref 39–117)
ALT: 16 U/L (ref 0–35)
ANION GAP: 7 (ref 5–15)
AST: 16 U/L (ref 0–37)
BUN: 9 mg/dL (ref 6–23)
CHLORIDE: 103 mmol/L (ref 96–112)
CO2: 23 mmol/L (ref 19–32)
CREATININE: 0.56 mg/dL (ref 0.50–1.10)
Calcium: 8.8 mg/dL (ref 8.4–10.5)
GFR calc non Af Amer: >90 mL/min (ref >90)
Glucose, Bld: 127 mg/dL — ABNORMAL HIGH (ref 70–99)
Potassium: 3.8 mmol/L (ref 3.5–5.1)
Sodium: 133 mmol/L — ABNORMAL LOW (ref 135–145)
TOTAL PROTEIN: 7.7 g/dL (ref 6.0–8.3)
Total Bilirubin: 0.5 mg/dL (ref 0.3–1.2)

## 2014-05-21 LAB — URINALYSIS, ROUTINE W REFLEX MICROSCOPIC
Bilirubin Urine: NEGATIVE
Glucose, UA: NEGATIVE mg/dL
Hgb urine dipstick: NEGATIVE
KETONES UR: NEGATIVE mg/dL
LEUKOCYTES UA: NEGATIVE
Nitrite: NEGATIVE
PROTEIN: NEGATIVE mg/dL
SPECIFIC GRAVITY, URINE: 1.022 (ref 1.005–1.030)
Urobilinogen, UA: 0.2 mg/dL (ref 0.0–1.0)
pH: 7 (ref 5.0–8.0)

## 2014-05-21 LAB — PREGNANCY, URINE: PREG TEST UR: POSITIVE — AB

## 2014-05-21 LAB — LIPASE, BLOOD: Lipase: 18 U/L (ref 11–59)

## 2014-05-21 MED ORDER — PRENATAL COMPLETE 14-0.4 MG PO TABS: 1.0000 | ORAL_TABLET | Freq: Every day | ORAL | Status: DC

## 2014-05-21 MED ORDER — METOCLOPRAMIDE HCL 10 MG PO TABS
10.0000 mg | ORAL_TABLET | Freq: Four times a day (QID) | ORAL | Status: DC | PRN
Start: 2014-05-21 — End: 2014-07-14

## 2014-05-21 MED ORDER — ONDANSETRON 8 MG PO TBDP
8.0000 mg | ORAL_TABLET | Freq: Once | ORAL | Status: AC
  Administered 2014-05-21: 8 mg via ORAL
  Filled 2014-05-21: qty 1

## 2014-05-21 NOTE — ED Notes (Addendum)
Pt c/o N,V,D starting around 0300 today. Reports emesis 2-3x per hour. Abdominal pain is generalized and denies localization to any specific area. No other c/c.

## 2014-05-21 NOTE — Progress Notes (Addendum)
  CARE MANAGEMENT ED NOTE 05/21/2014  Patient:  Kayla Flynn,Kayla Flynn   Account Number:  [id number]  Date Initiated:  05/21/2014  Documentation initiated by:  Radford PaxFERRERO,Kyleah P[place]ene  Subjective/Objective Assessment:   Patient pesents to Ed with abdminal pian n,v,d     Subjective/Objective Assessment Detail:     Action/Plan:   Action/Plan Detail:   Anticipated DC Date:  05/21/2014     Status Recommendation to Physician:   Result of Recommendation:    Other ED Services  Consult Working Plan    DC Planning Services  Other  PCP issues    Choice offered to / List presented to:            Status of service:  Completed, signed off  ED Comments:   ED Comments Detail:  EDCM spoke to patient at bedside. Patient confirms she [place] have a pcp [place] [facility], informed patient of services there and walk in times.  EDCM also provided patient with list of pcps who accept self pay patients, list of discount pharmacies and websites needymeds.org and GoodRX.com for medication assistance, phone number to inquire about the orange card, phone number to inquire about Mediciad, phone number to inquire about the Affordable Care Act, financial resources in the community such as local churches, [employer], urban ministries, and dental assistance for uninsured patients. Patient thankful for resources.  No further EDCM needs at this time.

## 2014-05-21 NOTE — ED Provider Notes (Signed)
CSN: [id number]     Arrival date & time 05/21/14  1407 History   First MD Initiated Contact with Patient 05/21/14 1553     Chief Complaint  Patient presents with  . Nausea  . Emesis  . Diarrhea     HPI Pt was seen at 1610. Per pt, c/o gradual onset and persistence of multiple intermittent episodes of N/V/D that began overnight last night. Describes the stools as "watery." Has been associated with generalized abd "cramping." Denies abd pain, no CP/SOB, no back pain, no fevers, no black or blood in stools or emesis. 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     Past Medical History  Diagnosis Date  . Hypertension   . Obesity   . Prediabetes     "prediabetic; controlled w/diet" (07/17/2012)  . Acute appendicitis 07/18/2012  . Obesity, morbid, BMI 50 or higher 07/18/2012   Past Surgical History  Procedure Laterality Date  . Appendectomy  07/17/2012  . Laparoscopic appendectomy N/A 07/17/2012    Procedure: APPENDECTOMY LAPAROSCOPIC;  Surgeon: Liz Malady, MD;  Location: [facility] OR;  Service: General;  Laterality: N/A;   Family History  Problem Relation Age of Onset  . Hypertension Mother    History  Substance Use Topics  . Smoking status: Current Every Day Smoker -- 0.10 packs/day for 1.2 years    Types: Cigarettes  . Smokeless tobacco: Never Used  . Alcohol Use: Yes    Review of Systems ROS: Statement: All systems negative except as marked or noted in the HPI; Constitutional: Negative for fever and chills. ; ; Eyes: Negative for eye pain, redness and discharge. ; ; ENMT: Negative for ear pain, hoarseness, nasal congestion, sinus pressure and sore throat. ; ; Cardiovascular: Negative for chest pain, palpitations, diaphoresis, dyspnea and peripheral edema. ; ; Respiratory: Negative for cough, wheezing and stridor. ; ; Gastrointestinal: +N/V/D. Negative for abdominal pain, blood in stool, hematemesis, jaundice and rectal bleeding. . ; ;  Genitourinary: Negative for dysuria, flank pain and hematuria. ; ; Musculoskeletal: Negative for back pain and neck pain. Negative for swelling and trauma.; ; Skin: Negative for pruritus, rash, abrasions, blisters, bruising and skin lesion.; ; Neuro: Negative for headache, lightheadedness and neck stiffness. Negative for weakness, altered level of consciousness , altered mental status, extremity weakness, paresthesias, involuntary movement, seizure and syncope.      Allergies  Review of patient's allergies indicates no known allergies.  Home Medications   Prior to Admission medications   Medication Sig Start Date End Date Taking? Authorizing Provider  cetirizine (ZYRTEC ALLERGY) 10 MG tablet Take 1 tablet (10 mg total) by mouth daily. 05/15/14  Yes Everlene Farrier, PA-C  ibuprofen (ADVIL,MOTRIN) 200 MG tablet Take 600 mg by mouth every 6 (six) hours as needed for fever (fever).   Yes Historical Provider, MD  promethazine (PHENERGAN) 25 MG tablet Take 1 tablet (25 mg total) by mouth every 6 (six) hours as needed for nausea or vomiting. 04/24/14  Yes Nicole Pisciotta, PA-C  Alum & Mag Hydroxide-Simeth (GI COCKTAIL) SUSP suspension Take 15 mLs by mouth 3 (three) times daily. Shake well. 04/24/14   Nicole Pisciotta, PA-C  amoxicillin (AMOXIL) 500 MG capsule Take 1 capsule (500 mg total) by mouth 3 (three) times daily. 04/24/14   Nicole Pisciotta, PA-C  dextromethorphan-guaiFENesin (MUCINEX DM) 30-600 MG per 12 hr tablet Take 1 tablet by mouth 2 (two) times daily. 05/15/14   Everlene Farrier, PA-C  diphenoxylate-atropine (LOMOTIL)  2.5-0.025 MG per tablet Take 1 tablet by mouth 4 (four) times daily as needed for diarrhea or loose stools. Patient not taking: Reported on 04/24/2014 01/28/13   Reuben Likesavid C Keller, MD  naproxen (NAPROSYN) 500 MG tablet Take 1 tablet (500 mg total) by mouth 2 (two) times daily with a meal. 05/15/14   Everlene FarrierWilliam Dansie, PA-C  ondansetron (ZOFRAN ODT) 8 MG disintegrating tablet Take 1 tablet  (8 mg total) by mouth every 8 (eight) hours as needed for nausea. Patient not taking: Reported on 04/24/2014 01/28/13   Reuben Likesavid C Keller, MD  oxyCODONE-acetaminophen (PERCOCET/ROXICET) 5-325 MG per tablet Take 1-2 tablets by mouth every 4 (four) hours as needed for severe pain. Patient not taking: Reported on 04/24/2014 06/16/13   Junius FinnerErin O'Malley, PA-C  penicillin v potassium (VEETID) 500 MG tablet Take 1 tablet (500 mg total) by mouth 4 (four) times daily. Patient not taking: Reported on 04/24/2014 06/16/13   Junius FinnerErin O'Malley, PA-C   BP 153/74 mmHg  Pulse 95  Temp(Src) 98 F (36.7 C) (Oral)  Resp 16  SpO2 100%  LMP 01/10/2014 (Approximate) Physical Exam  1615: Physical examination:  Nursing notes reviewed; Vital signs and O2 SAT reviewed;  Constitutional: Well developed, Well nourished, Well hydrated, In no acute distress; Head:  Normocephalic, atraumatic; Eyes: EOMI, PERRL, No scleral icterus; ENMT: Mouth and pharynx normal, Mucous membranes moist; Neck: Supple, Full range of motion, No lymphadenopathy; Cardiovascular: Regular rate and rhythm, No murmur, rub, or gallop; Respiratory: Breath sounds clear & equal bilaterally, No rales, rhonchi, wheezes.  Speaking full sentences with ease, Normal respiratory effort/excursion; Chest: Nontender, Movement normal; Abdomen: Soft, Nontender, Nondistended, Normal bowel sounds; Genitourinary: No CVA tenderness; Extremities: Pulses normal, No tenderness, No edema, No calf edema or asymmetry.; Neuro: AA&Ox3, Major CN grossly intact.  Speech clear. No gross focal motor or sensory deficits in extremities.; Skin: Color normal, Warm, Dry.   ED Course  Procedures     EKG Interpretation None      MDM  MDM Reviewed: previous chart, nursing note and vitals Reviewed previous: labs Interpretation: labs and x-ray   Results for orders placed or performed during the hospital encounter of 05/21/14  CBC with Differential  Result Value Ref Range   WBC 12.0 (H) 4.0 -  10.5 K/uL   RBC 4.02 3.87 - 5.11 MIL/uL   Hemoglobin 11.7 (L) 12.0 - 15.0 g/dL   HCT 16.136.5 09.636.0 - 04.546.0 %   MCV 90.8 78.0 - 100.0 fL   MCH 29.1 26.0 - 34.0 pg   MCHC 32.1 30.0 - 36.0 g/dL   RDW 40.913.5 81.111.5 - 91.415.5 %   Platelets 357 150 - 400 K/uL   Neutrophils Relative % 92 (H) 43 - 77 %   Neutro Abs 11.1 (H) 1.7 - 7.7 K/uL   Lymphocytes Relative 4 (L) 12 - 46 %   Lymphs Abs 0.5 (L) 0.7 - 4.0 K/uL   Monocytes Relative 4 3 - 12 %   Monocytes Absolute 0.5 0.1 - 1.0 K/uL   Eosinophils Relative 0 0 - 5 %   Eosinophils Absolute 0.0 0.0 - 0.7 K/uL   Basophils Relative 0 0 - 1 %   Basophils Absolute 0.0 0.0 - 0.1 K/uL  Comprehensive metabolic panel  Result Value Ref Range   Sodium 133 (L) 135 - 145 mmol/L   Potassium 3.8 3.5 - 5.1 mmol/L   Chloride 103 96 - 112 mmol/L   CO2 23 19 - 32 mmol/L   Glucose, Bld 127 (H) 70 -  99 mg/dL   BUN 9 6 - 23 mg/dL   Creatinine, Ser 1.61 0.50 - 1.10 mg/dL   Calcium 8.8 8.4 - 09.6 mg/dL   Total Protein 7.7 6.0 - 8.3 g/dL   Albumin 3.9 3.5 - 5.2 g/dL   AST 16 0 - 37 U/L   ALT 16 0 - 35 U/L   Alkaline Phosphatase 55 39 - 117 U/L   Total Bilirubin 0.5 0.3 - 1.2 mg/dL   GFR calc non Af Amer >90 >90 mL/min   GFR calc Af Amer >90 >90 mL/min   Anion gap 7 5 - 15  Lipase, blood  Result Value Ref Range   Lipase 18 11 - 59 U/L  Urinalysis, Routine w reflex microscopic  Result Value Ref Range   Color, Urine YELLOW YELLOW   APPearance CLOUDY (A) CLEAR   Specific Gravity, Urine 1.022 1.005 - 1.030   pH 7.0 5.0 - 8.0   Glucose, UA NEGATIVE NEGATIVE mg/dL   Hgb urine dipstick NEGATIVE NEGATIVE   Bilirubin Urine NEGATIVE NEGATIVE   Ketones, ur NEGATIVE NEGATIVE mg/dL   Protein, ur NEGATIVE NEGATIVE mg/dL   Urobilinogen, UA 0.2 0.0 - 1.0 mg/dL   Nitrite NEGATIVE NEGATIVE   Leukocytes, UA NEGATIVE NEGATIVE  Pregnancy, urine  Result Value Ref Range   Preg Test, Ur POSITIVE (A) NEGATIVE    1920:  ODT zofran given by ED RN per Triage protocol. Pt has since  tol PO well while in the ED without N/V.  No stooling while in the ED.  Abd remains benign, VSS. Feels better and wants to go home now. Pt informed of her +preg test; states she "didn't know." Denies pelvic pain, vaginal bleeding/discharge. Dx and testing d/w pt.  Questions answered.  Verb understanding, agreeable to d/c home with outpt f/u.       Samuel Jester, DO 05/23/14 1401

## 2014-05-21 NOTE — ED Notes (Signed)
She is drinking ginger ale without n/v; and I have just brought her a 2nd one.

## 2014-05-21 NOTE — ED Notes (Signed)
Per Dr. Clarene DukeMcManus I got her some ginger ale to drink.  She states the Zofran given at triage has helped greatly.

## 2014-05-21 NOTE — ED Notes (Signed)
Pt was informed that a urine specimen was needed. Pt stated she did not have to urinate right now.

## 2014-05-21 NOTE — Discharge Instructions (Signed)
°Emergency Department Resource Guide °1) Find a Doctor and Pay Out of Pocket °Although you won't have to find out who is covered by your insurance plan, it is a good idea to ask around and get recommendations. You will then need to call the office and see if the doctor you have chosen will accept you as a new patient and what types of options they offer for patients who are self-pay. Some doctors offer discounts or will set up payment plans for their patients who do not have insurance, but you will need to ask so you aren't surprised when you get to your appointment. ° °2) Contact Your Local Health Department °Not all health departments have doctors that can see patients for sick visits, but many do, so it is worth a call to see if yours does. If you don't know where your local health department is, you can check in your phone book. The CDC also has a tool to help you locate your state's health department, and many state websites also have listings of all of their local health departments. ° °3) Find a Walk-in Clinic °If your illness is not likely to be very severe or complicated, you may want to try a walk in clinic. These are popping up all over the country in pharmacies, drugstores, and shopping centers. They're usually staffed by nurse practitioners or physician assistants that have been trained to treat common illnesses and complaints. They're usually fairly quick and inexpensive. However, if you have serious medical issues or chronic medical problems, these are probably not your best option. ° °No Primary Care Doctor: °- Call Health Connect at  832-8000 - they can help you locate a primary care doctor that  accepts your insurance, provides certain services, etc. °- Physician Referral Service- 1-800-533-3463 ° °Chronic Pain Problems: °Organization         Address  Phone   Notes  °[facility]  (336) 297-2271 Patients need to be referred by their primary care doctor.  ° °Medication  Assistance: °Organization         Address  Phone   Notes  °[place] [facility] [address] °[place], [place] [address] (336) 641-8030 --Must be a resident of [place] °-- Must have NO insurance coverage whatsoever (no Medicaid/ Medicare, etc.) °-- The pt. MUST have a primary care doctor that directs their care regularly and follows them in the community °  °[facility]  (866) 331-1348   °[employer]  (888) 892-1162   ° °Agencies that provide inexpensive medical care: °Organization         Address  Phone   Notes  °[facility]  (336) 832-8035   °[facility]    (336) 832-7272   °[facility] [address] °[place], [place] [address] (336) 832-4777   °[facility] [address], °[place] (336) 271-4999   °[facility]    (336) 373-0678   °[facility]    (336) 272-1050   °[facility] ° [address], [place] Phone:  (336) 832-4444, Fax:  (336) 832-4440 Hours of Operation:  9 am - 6 pm, M-F.  Also accepts Medicaid/Medicare and self-pay.  °[facility] ° [address], [place] Phone: (336) 832-3150, Fax: (336) [phone]. Hours of Operation:  8:30 am - 5:30 pm, M-F.  Also accepts Medicaid and self-pay.  °[facility] [place] [address], [place] Phone: (336) 878-6027   °[facility] [address], [place], [place] (336)723-1848, Ext. 123 Mondays & Thursdays: 7-9 AM.  First 15 patients are seen on a first come, first serve basis. °  ° °Medicaid-accepting [place] Providers: ° °Organization         Address  Phone   Notes  °[facility] [address], [place] (336) 641-2100 Also accepts self-pay patients.  °[facility] [address], [place] ° (336) 856-9996   °[facility] [address], [place]  (336) 288-8857   °[facility] [address] [place] [address], [place] (336) 299-7000   °Veita Bland [address], [place]  ° (336) 373-1557 Only accepts [place] Access Medicaid patients after they have their name applied to their card.  ° °Self-Pay (no insurance) in [place]: ° °Organization         Address  Phone   Notes  °Sickle Cell Patients, [facility] [address], [place] (336) 832-1970   °[facility] [address], [place] (336) 832-4400   °[facility] ° [address], [place] (336) 992-4800   °[facility]/Dr. Osei-Bonsu ° [address] [place] [address], [place] or [address], [place] (336) 841-8500 Phone number for both [place] and [place] locations is the same.  °[facility] [address], [place] (336) 299-0000   °[facility] [address] [place] [address], [place] or [address] (336) 852-7530 °(336) 878-2260   °[facility] [address], [place] (336) 350-1642, phone; (336) 294-5005, fax Sees patients 1st and 3rd Saturday of every month.  Must not qualify for public or private insurance (i.e. Medicaid, Medicare, [place] Health Choice, Veterans' Benefits) • Household income should be no more than 200% of the poverty level •The clinic cannot treat you if you are pregnant or think you are pregnant • Sexually transmitted diseases are not treated at the clinic.  ° ° °Dental Care: °Organization         Address  Phone  Notes  °[place] [facility] [address], [place] (336) 641-6152 Accepts children up to age 21 who are enrolled in Medicaid or [place] Health Choice; pregnant women with a Medicaid card; and children who have applied for Medicaid or [place] Health Choice, but were declined, whose parents can pay a reduced fee at time of service.  °[place]  [facility] [place]  [address], [place] (336) 641-7733 Accepts children up to age 21 who are enrolled in Medicaid or [place] Health Choice; pregnant women with a Medicaid card; and children who have applied for Medicaid or [place] Health Choice, but were declined, whose parents can pay a reduced fee at time of service.  °[facility] ° [address], [place] (336) 641-4533 Patients are seen by appointment only. Walk-ins are not accepted. [facility] will see patients 18 years of age and older. °Monday - Tuesday (8am-5pm) °Most Wednesdays (8:30-5pm) °$30 per visit, cash only  °[facility] ° [address], [place] (336) 641-4533 Patients are seen by appointment only. Walk-ins are not accepted. [facility] will see patients 18 years of age and older. °One   Wednesday Evening (Monthly: Volunteer Based).  $30 per visit, cash only  °[facility]  (919) 537-3737 for adults; Children under age 4, call Graduate Pediatric Dentistry at ([phone]) [phone]. Children aged 4-14, please call (919) [phone] to request a pediatric application. ° Dental services are provided in all areas of dental care including fillings, crowns and bridges, complete and partial dentures, implants, gum treatment, root canals, and extractions. Preventive care is also provided. Treatment is provided to both adults and children. °Patients are selected via a lottery and there is often a waiting list. °  °[facility] [address], °[place] ° (336) 763-8833 [email or web address] °  °[facility] [address], [place], [place] (336)723-1848, Ext. 123 Second and Fourth Thursday of each month, opens at 6:30 AM; Clinic ends at 9 AM.  Patients are seen on a first-come first-served basis, and a limited number are seen during each clinic.  ° °[facility] ° [address], [place], [place] (336) 723-7904    Eligibility Requirements °You must have lived in [place], [place], or [place] counties for at least the last three months. °  You cannot be eligible for state or federal sponsored healthcare insurance, including Veterans Administration, Medicaid, or Medicare. °  You generally cannot be eligible for healthcare insurance through your employer.  °  How to apply: °Eligibility screenings are held every Tuesday and Wednesday afternoon from 1:00 pm until 4:00 pm. You do not need an appointment for the interview!  °[facility] [address], [place], [place] 336-631-2330   °[facility] [facility]  336-342-8273   °[place]  336-703-3100   °[facility]  336-570-6415   ° °Behavioral Health Resources in the Community: °Intensive Outpatient Programs °Organization         Address  Phone  Notes  °[facility] [address], [place], [place] 336-878-6098   °[facility] [address], [place], [place] 336-832-9800   °[facility] [address], [place], [place] ° 336-882-2125   °[facility] [address],  °[place], [place] 1-800-853-5163 or 336-641-4981   °Substance Abuse Resources °Organization         Address  Phone  Notes  °[facility]  336-882-2125   °[facility]  336-784-9470   °[facility]  336-285-9073   °[facility]  336-845-3988   °Residential & Outpatient Substance Abuse Program  1-800-659-3381   °Psychological Services °Organization         Address  Phone  Notes  °[facility]  336- 832-9600   °[facility]  336- 378-7881   °[facility] [address], [place] 1-800-853-5163 or 336-641-4981   ° °Mobile Crisis Teams °Organization         Address  Phone  Notes  °[facility], Mobile Crisis Care Unit  1-877-626-1772   °Assertive °[facility] ° [address]  [place], [place] 336-834-9664   °Sharon DeEsch [address] °[place] 336-554-5454   ° °Self-Help/Support Groups °Organization         Address  Phone             Notes  °[facility] of  - variety of support groups  336- 373-1402 Call for more information  °Narcotics Anonymous (NA), [facility] [address], °[place]  2 meetings at this location  ° °  Residential Treatment Programs Organization         Address  Phone  Notes  [facility] [address],    [place]  [phone]   [facility]  [address], [place] [id number], [place], [place] [phone]) [phone]   [facility] [address], [place] [phone] Admissions: 8am-3pm M-F  [facility] [address]    [place], [place] [phone]) [phone]   [facility] [address], [place], [place] [phone]   [facility] [address]  [place], [place] [phone]   [facility] Intensive Outpatient [address], [place], [place] [phone]   [facility] ([facility] [address]  [place], [place] [phone] or (708)[phone]   [facility] ([facility]) [address] [place], [place] [phone] Accepts Medicaid  [facility] [address]  [place] [phone] Substance Abuse/Addiction Treatment   [facility]         Address  Phone  Notes  [facility]  (816) [phone](888) [phone]   Angie FavaJulie Brannon, PhD [address], [address] [place], [place]   [phone](336) [phone] or [phone](336) [phone]   [facility]   [address] [place], [place] [phone](336) [phone]   [facility] [address], [place], [place] [phone](336) [phone] Insurance/Medicaid/sponsorship through [facility] [address]                                    [place], [place] [phone]) [phone] Therapy/tele-psych/case    [facility] [address]   [place], [place] [phone]) [phone]    Dr. Lolly MustacheArfeen  [phone]) [phone]   [facility]  [facility] 1) [address], [place] 2) [address], [place] 3)  [address], [place] [phone](336) [phone](336) [phone]  [phone](336) [phone]   [facility] Child Abuse Hotline 6397599846(336) (305)[phone] or [phone](336) [phone] (After Hours)      Take the prescriptions as directed.  Increase your fluid intake (ie:  Gatoraide) for the next few days, as discussed.  Eat a bland diet and advance to your regular diet slowly as you can tolerate it.   Avoid full strength juices, as well as milk and milk products until your diarrhea has resolved.  Call your regular OB/GYN doctor tomorrow to schedule a follow up appointment within the next week.  Return to the Emergency Department immediately if not improving (or even worsening) despite taking the medicines as prescribed, any black or bloody stool or vomit, if you develop a fever over "101," or for any other concerns.

## 2014-06-06 ENCOUNTER — Encounter (HOSPITAL_COMMUNITY): Payer: Self-pay | Admitting: *Deleted

## 2014-06-06 ENCOUNTER — Inpatient Hospital Stay (HOSPITAL_COMMUNITY)
Admission: AD | Admit: 2014-06-06 | Discharge: 2014-06-06 | Disposition: A | Discharge status: Home / Self Care | Payer: Medicaid Other | Source: Ambulatory Visit | Attending: Obstetrics & Gynecology | Admitting: Obstetrics & Gynecology

## 2014-06-06 DIAGNOSIS — F17210 Nicotine dependence, cigarettes, uncomplicated: Secondary | ICD-10-CM | POA: Insufficient documentation

## 2014-06-06 DIAGNOSIS — R7309 Other abnormal glucose: Secondary | ICD-10-CM | POA: Insufficient documentation

## 2014-06-06 DIAGNOSIS — O99212 Obesity complicating pregnancy, second trimester: Secondary | ICD-10-CM | POA: Insufficient documentation

## 2014-06-06 DIAGNOSIS — Z6841 Body Mass Index (BMI) 40.0 and over, adult: Secondary | ICD-10-CM | POA: Insufficient documentation

## 2014-06-06 DIAGNOSIS — Z3A21 21 weeks gestation of pregnancy: Secondary | ICD-10-CM | POA: Diagnosis not present

## 2014-06-06 DIAGNOSIS — O99332 Smoking (tobacco) complicating pregnancy, second trimester: Secondary | ICD-10-CM | POA: Diagnosis not present

## 2014-06-06 DIAGNOSIS — K6289 Other specified diseases of anus and rectum: Secondary | ICD-10-CM | POA: Diagnosis present

## 2014-06-06 DIAGNOSIS — K5900 Constipation, unspecified: Secondary | ICD-10-CM | POA: Diagnosis not present

## 2014-06-06 DIAGNOSIS — O9989 Other specified diseases and conditions complicating pregnancy, childbirth and the puerperium: Secondary | ICD-10-CM | POA: Insufficient documentation

## 2014-06-06 DIAGNOSIS — O99612 Diseases of the digestive system complicating pregnancy, second trimester: Secondary | ICD-10-CM

## 2014-06-06 MED ORDER — POLYETHYLENE GLYCOL 3350 17 GM/SCOOP PO POWD: 1.0000 | Freq: Once | ORAL | Status: DC

## 2014-06-06 MED ORDER — DOCUSATE SODIUM 100 MG PO CAPS: 100.0000 mg | ORAL_CAPSULE | Freq: Two times a day (BID) | ORAL | Status: DC

## 2014-06-06 NOTE — Progress Notes (Signed)
Written and verbal d/c instructions given and understanding voiced. 

## 2014-06-06 NOTE — Discharge Instructions (Signed)

## 2014-06-06 NOTE — MAU Note (Addendum)
Work in childcare center. About 1700 started having pain R side. Then pain went to rectum. Passed gas and felt alittle better. Had BM about 1930 which was not painful. Am alittle constipated. Continue to have some shooting pains in rectum. No hemorrhoids. Just found out was pregnant [redacted]wks ago when [facility]. [facility]

## 2014-06-06 NOTE — MAU Provider Note (Signed)
  History     CSN: [id number]  Arrival date and time: 06/06/14 2139   First Provider Initiated Contact with Patient 06/06/14 2237      Chief Complaint  Patient presents with  . Rectal Pain   HPI Ms Kayla Flynn is a 24yo G1 @ 21.0wks by LMP presenting for eval of intermittent shooting rectal pain. Denies rectal bldg, hemorrhoids. Reports constipation earlier today with straining to pass larger/hard stool. No contractions, abd pain, or vag leaking/bldg. [facility] but did not have an exam. She has a NOB visit next week with exam and labs with U/S soon to follow that.  OB History    Gravida Para Term Preterm AB TAB SAB Ectopic Multiple Living   1               Past Medical History  Diagnosis Date  . Hypertension   . Obesity   . Prediabetes     "prediabetic; controlled w/diet" (07/17/2012)  . Acute appendicitis 07/18/2012  . Obesity, morbid, BMI 50 or higher 07/18/2012    Past Surgical History  Procedure Laterality Date  . Appendectomy  07/17/2012  . Laparoscopic appendectomy N/A 07/17/2012    Procedure: APPENDECTOMY LAPAROSCOPIC;  Surgeon: Liz MaladyBurke E Thompson, MD;  [facility];  Service: General;  Laterality: N/A;    Family History  Problem Relation Age of Onset  . Hypertension Mother     History  Substance Use Topics  . Smoking status: Current Every Day Smoker -- 0.10 packs/day for 1.2 years    Types: Cigarettes  . Smokeless tobacco: Never Used  . Alcohol Use: Yes     Comment: last time couple wks ago but not since finding out was pregnant    Allergies: No Known Allergies   ROS Physical Exam   Blood pressure 140/88, pulse 81, temperature 98.9 F (37.2 C), resp. rate 18, height 4\' 10"  (1.473 m), weight 159.666 kg (352 lb), last menstrual period 01/10/2014. BP @ discharge: 166/81  Physical Exam  Constitutional: She is oriented to person, place, and time. She appears well-developed.  Morbidly obese w/ large amt of weigh  in midsection  HENT:  Head: Normocephalic.  Eyes: Pupils are equal, round, and reactive to light.  Cardiovascular: Normal rate.   Respiratory: Effort normal.  GI:  Very large pannus; FHT faintly dopplered @ 160s  Musculoskeletal: Normal range of motion.  Neurological: She is alert and oriented to person, place, and time.  Skin: Skin is warm and dry.  Psychiatric: She has a normal mood and affect. Her behavior is normal. Thought content normal.    MAU Course  Procedures   Assessment and Plan  IUP@21 .0wks by LMP Constipation/rectal pain cHTN 'Prediabetic'  D/C home with measures to decrease constipation including increasing fluids, fiber and taking stool softener/Miralax prn Inbox msg to clinic to see about [facility] cHTN [facility] than [facility]; rec US prior to NOB visit  Cam HaiSHAW, KIMBERLY CNM 06/06/2014, 10:52 PM

## 2014-06-08 ENCOUNTER — Encounter (HOSPITAL_COMMUNITY): Payer: Self-pay

## 2014-06-08 ENCOUNTER — Inpatient Hospital Stay (HOSPITAL_COMMUNITY): Payer: Medicaid Other

## 2014-06-08 ENCOUNTER — Inpatient Hospital Stay (HOSPITAL_COMMUNITY)
Admission: AD | Admit: 2014-06-08 | Discharge: 2014-06-08 | Disposition: A | Discharge status: Home / Self Care | Payer: Medicaid Other | Source: Ambulatory Visit | Attending: Obstetrics & Gynecology | Admitting: Obstetrics & Gynecology

## 2014-06-08 DIAGNOSIS — O212 Late vomiting of pregnancy: Secondary | ICD-10-CM | POA: Insufficient documentation

## 2014-06-08 DIAGNOSIS — Z3A21 21 weeks gestation of pregnancy: Secondary | ICD-10-CM | POA: Insufficient documentation

## 2014-06-08 DIAGNOSIS — R519 Headache, unspecified: Secondary | ICD-10-CM

## 2014-06-08 DIAGNOSIS — O99332 Smoking (tobacco) complicating pregnancy, second trimester: Secondary | ICD-10-CM | POA: Diagnosis not present

## 2014-06-08 DIAGNOSIS — O99212 Obesity complicating pregnancy, second trimester: Secondary | ICD-10-CM | POA: Diagnosis not present

## 2014-06-08 DIAGNOSIS — Z6841 Body Mass Index (BMI) 40.0 and over, adult: Secondary | ICD-10-CM | POA: Insufficient documentation

## 2014-06-08 DIAGNOSIS — O219 Vomiting of pregnancy, unspecified: Secondary | ICD-10-CM

## 2014-06-08 DIAGNOSIS — O26891 Other specified pregnancy related conditions, first trimester: Secondary | ICD-10-CM

## 2014-06-08 DIAGNOSIS — F17210 Nicotine dependence, cigarettes, uncomplicated: Secondary | ICD-10-CM | POA: Insufficient documentation

## 2014-06-08 DIAGNOSIS — O99211 Obesity complicating pregnancy, first trimester: Secondary | ICD-10-CM

## 2014-06-08 DIAGNOSIS — R51 Headache: Secondary | ICD-10-CM

## 2014-06-08 DIAGNOSIS — IMO0002 Reserved for concepts with insufficient information to code with codable children: Secondary | ICD-10-CM

## 2014-06-08 LAB — URINALYSIS, ROUTINE W REFLEX MICROSCOPIC
BILIRUBIN URINE: NEGATIVE
GLUCOSE, UA: NEGATIVE mg/dL
HGB URINE DIPSTICK: NEGATIVE
Ketones, ur: NEGATIVE mg/dL
Leukocytes, UA: NEGATIVE
NITRITE: NEGATIVE
Protein, ur: NEGATIVE mg/dL
SPECIFIC GRAVITY, URINE: 1.015 (ref 1.005–1.030)
Urobilinogen, UA: 0.2 mg/dL (ref 0.0–1.0)
pH: 6 (ref 5.0–8.0)

## 2014-06-08 MED ORDER — BUTALBITAL-APAP-CAFFEINE 50-325-40 MG PO TABS: 1.0000 | ORAL_TABLET | Freq: Four times a day (QID) | ORAL | Status: DC | PRN

## 2014-06-08 MED ORDER — PROMETHAZINE HCL 25 MG PO TABS
25.0000 mg | ORAL_TABLET | Freq: Once | ORAL | Status: AC
  Administered 2014-06-08: 25 mg via ORAL
  Filled 2014-06-08: qty 1

## 2014-06-08 MED ORDER — OXYCODONE-ACETAMINOPHEN 5-325 MG PO TABS
2.0000 | ORAL_TABLET | Freq: Once | ORAL | Status: AC
  Administered 2014-06-08: 2 via ORAL
  Filled 2014-06-08: qty 2

## 2014-06-08 NOTE — MAU Provider Note (Signed)
History     CSN: [id number]  Arrival date and time: 06/08/14 1137   First Provider Initiated Contact with Patient 06/08/14 1252      Chief Complaint  Patient presents with  . Headache   HPI Kayla Flynn 24 y.o. G1P0  presents to MAU complaining of headache that started yesterday.  She does have a history of headaches.  It has been severe, 10/10 (although presently 6/10 with the percocet recently given).  It is located on both sides in the back of the neck, worsened with downward movement of the head.  She denies vision changes.  It is worse with movement, associated with photophobia and phonophobia.  She has had nausea but no vomiting.  It is throbbing up into the temples.  She denies fever, abdominal pain, vaginal bleeding, discharge, dysuria.   OB History    Gravida Para Term Preterm AB TAB SAB Ectopic Multiple Living   1               Past Medical History  Diagnosis Date  . Hypertension   . Obesity   . Prediabetes     "prediabetic; controlled w/diet" (07/17/2012)  . Acute appendicitis 07/18/2012  . Obesity, morbid, BMI 50 or higher 07/18/2012    Past Surgical History  Procedure Laterality Date  . Appendectomy  07/17/2012  . Laparoscopic appendectomy N/A 07/17/2012    Procedure: APPENDECTOMY LAPAROSCOPIC;  Surgeon: Liz Malady, MD;  [facility];  Service: General;  Laterality: N/A;    Family History  Problem Relation Age of Onset  . Hypertension Mother     History  Substance Use Topics  . Smoking status: Current Every Day Smoker -- 0.10 packs/day for 1.2 years    Types: Cigarettes  . Smokeless tobacco: Never Used  . Alcohol Use: Yes     Comment: last time couple wks ago but not since finding out was pregnant    Allergies: No Known Allergies  Prescriptions prior to admission  Medication Sig Dispense Refill Last Dose  . acetaminophen (TYLENOL) 500 MG tablet Take 1,000 mg by mouth every 6 (six) hours as needed for mild pain.   06/07/2014 at Unknown time   . Prenatal Vit-Fe Fumarate-FA (PRENATAL COMPLETE) 14-0.4 MG TABS Take 1 tablet by mouth daily. 30 each 0 06/07/2014 at Unknown time  . docusate sodium (COLACE) 100 MG capsule Take 1 capsule (100 mg total) by mouth every 12 (twelve) hours. (Patient not taking: Reported on 06/08/2014) 60 capsule 0 Not Taking at Unknown time  . metoCLOPramide (REGLAN) 10 MG tablet Take 1 tablet (10 mg total) by mouth every 6 (six) hours as needed for nausea or vomiting. (Patient not taking: Reported on 06/08/2014) 8 tablet 0 Not Taking at Unknown time  . polyethylene glycol powder (MIRALAX) powder Take 255 g by mouth once. (Patient not taking: Reported on 06/08/2014) 255 g 0 Not Taking at Unknown time  . promethazine (PHENERGAN) 25 MG tablet Take 1 tablet (25 mg total) by mouth every 6 (six) hours as needed for nausea or vomiting. (Patient not taking: Reported on 06/08/2014) 12 tablet 0 Not Taking at Unknown time    ROS Pertinent ROS in HPI  Physical Exam   Blood pressure 122/58, pulse 85, temperature 98.1 F (36.7 C), resp. rate 18, height  (1.499 m), weight 350 lb (158.759 kg), last menstrual period 01/10/2014.  Physical Exam  Constitutional: She is oriented to person, place, and time. She appears well-developed. No distress.  obese  HENT:  Head:  Normocephalic and atraumatic.  Eyes: EOM are normal.  Neck: Normal range of motion.  Cardiovascular: Normal rate and regular rhythm.   Respiratory: Effort normal and breath sounds normal. No respiratory distress.  GI: Soft. Bowel sounds are normal. She exhibits no distension and no mass. There is no tenderness. There is no rebound and no guarding.  Musculoskeletal: Normal range of motion.  Neurological: She is alert and oriented to person, place, and time. No cranial nerve deficit. She exhibits normal muscle tone. Coordination normal.  Skin: Skin is warm and dry.  Psychiatric: She has a normal mood and affect.   Results for orders placed or performed during the  hospital encounter of 06/08/14 (from the past 24 hour(s))  Urinalysis, Routine w reflex microscopic     Status: None   Collection Time: 06/08/14 11:45 AM  Result Value Ref Range   Color, Urine YELLOW YELLOW   APPearance CLEAR CLEAR   Specific Gravity, Urine 1.015 1.005 - 1.030   pH 6.0 5.0 - 8.0   Glucose, UA NEGATIVE NEGATIVE mg/dL   Hgb urine dipstick NEGATIVE NEGATIVE   Bilirubin Urine NEGATIVE NEGATIVE   Ketones, ur NEGATIVE NEGATIVE mg/dL   Protein, ur NEGATIVE NEGATIVE mg/dL   Urobilinogen, UA 0.2 0.0 - 1.0 mg/dL   Nitrite NEGATIVE NEGATIVE   Leukocytes, UA NEGATIVE NEGATIVE    MAU Course  Procedures  MDM Percocet ordered by Marlynn Perking. Lawson.  Pt initially reported [redacted] week gestation and no fetal heart tones were detected on triage.  Bedside u/s confirms [redacted] week gestation - viable.   Pt discussed with Dr. Despina HiddenEure.  He advises no need for further workup.  Advises for treatment with fioricet.   Pt notes improvement in HA while here.  She complains of increasing nausea which is likely attributed to Percocet.   She has requested nausea medication and phenergan ordered.    Assessment and Plan  A:  1. Obesity affecting pregnancy in first trimester   2. Absent fetal heart tones   3. Nausea/vomiting in pregnancy   4. Headache in pregnancy, first trimester    P: DIscharge to home Fioricet rx prn HA Pt already has reglan and phenergan.  She may use one of those prn nausea Do not mix tylenol with fioricet [facility] Patient may return to MAU as needed or if her condition were to change or worsen   Bertram Denvereague Clark, Karen E 06/08/2014, 12:56 PM

## 2014-06-08 NOTE — Discharge Instructions (Signed)
Prenatal Care  WHAT IS PRENATAL CARE?  Prenatal care means health care during your pregnancy, before your baby is born. It is very important to take care of yourself and your baby during your pregnancy by:   Getting early prenatal care. If you know you are pregnant, or think you might be pregnant, call your health care provider as soon as possible. Schedule a visit for a prenatal exam.  Getting regular prenatal care. Follow your health care provider's schedule for blood and other necessary tests. Do not miss appointments.  Doing everything you can to keep yourself and your baby healthy during your pregnancy.  Getting complete care. Prenatal care should include evaluation of the medical, dietary, educational, psychological, and social needs of you and your significant other. The medical and genetic history of your family and the family of your baby's father should be discussed with your health care provider.  Discussing with your health care provider:  Prescription, over-the-counter, and herbal medicines that you take.  Any history of substance abuse, alcohol use, smoking, and illegal drug use.  Any history of domestic abuse and violence.  Immunizations you have received.  Your nutrition and diet.  The amount of exercise you do.  Any environmental and occupational hazards to which you are exposed.  History of sexually transmitted infections for both you and your partner.  Previous pregnancies you have had. WHY IS PRENATAL CARE SO IMPORTANT?  By regularly seeing your health care provider, you help ensure that problems can be identified early so that they can be treated as soon as possible. Other problems might be prevented. Many studies have shown that early and regular prenatal care is important for the health of mothers and their babies.  HOW CAN I TAKE CARE OF MYSELF WHILE I AM PREGNANT?  Here are ways to take care of yourself and your baby:   Start or continue taking your  multivitamin with 400 micrograms (mcg) of folic acid every day.  Get early and regular prenatal care. It is very important to see a health care provider during your pregnancy. Your health care provider will check at each visit to make sure that you and your baby are healthy. If there are any problems, action can be taken right away to help you and your baby.  Eat a healthy diet that includes:  Fruits.  Vegetables.  Foods low in saturated fat.  Whole grains.  Calcium-rich foods, such as milk, yogurt, and hard cheeses.  Drink 6-8 glasses of liquids a day.  Unless your health care provider tells you not to, try to be physically active for 30 minutes, most days of the week. If you are pressed for time, you can get your activity in through 10-minute segments, three times a day.  Do not smoke, drink alcohol, or use drugs. These can cause long-term damage to your baby. Talk with your health care provider about steps to take to stop smoking. Talk with a member of your faith community, a counselor, a trusted friend, or your health care provider if you are concerned about your alcohol or drug use.  Ask your health care provider before taking any medicine, even over-the-counter medicines. Some medicines are not safe to take during pregnancy.  Get plenty of rest and sleep.  Avoid hot tubs and saunas during pregnancy.  Do not have X-rays taken unless absolutely necessary and with the recommendation of your health care provider. A lead shield can be placed on your abdomen to protect your baby when  X-rays are taken in other parts of your body. °· Do not empty the cat litter when you are pregnant. It may contain a parasite that causes an infection called toxoplasmosis, which can cause birth defects. Also, use gloves when working in garden areas used by cats. °· Do not eat uncooked or undercooked meats or fish. °· Do not eat soft, mold-ripened cheeses (Brie, Camembert, and chevre) or soft, blue-veined  cheese (Danish blue and Roquefort). °· Stay away from toxic chemicals like: °¨ Insecticides. °¨ Solvents (some cleaners or paint thinners). °¨ Lead. °¨ Mercury. °· Sexual intercourse may continue until the end of the pregnancy, unless you have a medical problem or there is a problem with the pregnancy and your health care provider tells you not to. °· Do not wear high-heel shoes, especially during the second half of the pregnancy. You can lose your balance and fall. °· Do not take long trips, unless absolutely necessary. Be sure to see your health care provider before going on the trip. °· Do not sit in one position for more than 2 hours when on a trip. °· Take a copy of your medical records when going on a trip. Know where a hospital is located in the city you are visiting, in case of an emergency. °· Most dangerous household products will have pregnancy warnings on their labels. Ask your health care provider about products if you are unsure. °· Limit or eliminate your caffeine intake from coffee, tea, sodas, medicines, and chocolate. °· Many women continue working through pregnancy. Staying active might help you stay healthier. If you have a question about the safety or the hours you work at your particular job, talk with your health care provider. °· Get informed: °¨ Read books. °¨ Watch videos. °¨ Go to childbirth classes for you and your significant other. °¨ Talk with experienced moms. °· Ask your health care provider about childbirth education classes for you and your partner. Classes can help you and your partner prepare for the birth of your baby. °· Ask about a baby doctor (pediatrician) and methods and pain medicine for labor, delivery, and possible cesarean delivery. °HOW OFTEN SHOULD I SEE MY HEALTH CARE PROVIDER DURING PREGNANCY?  °Your health care provider will give you a schedule for your prenatal visits. You will have visits more often as you get closer to the end of your pregnancy. An average  pregnancy lasts about 40 weeks.  °A typical schedule includes visiting your health care provider:  °· About once each month during your first 6 months of pregnancy. °· Every 2 weeks during the next 2 months. °· Weekly in the last month, until the delivery date. °Your health care provider will probably want to see you more often if: °· You are older than 35 years. °· Your pregnancy is high risk because you have certain health problems or problems with the pregnancy, such as: °¨ Diabetes. °¨ High blood pressure. °¨ The baby is not growing on schedule, according to the dates of the pregnancy. °Your health care provider will do special tests to make sure you and your baby are not having any serious problems. °WHAT HAPPENS DURING PRENATAL VISITS?  °· At your first prenatal visit, your health care provider will do a physical exam and talk to you about your health history and the health history of your partner and your family. Your health care provider will be able to tell you what date to expect your baby to be born on. °· Your   first physical exam will include checks of your blood pressure, measurements of your height and weight, and an exam of your pelvic organs. Your health care provider will do a Pap test if you have not had one recently and will do cultures of your cervix to make sure there is no infection.  At each prenatal visit, there will be tests of your blood, urine, blood pressure, weight, and the progress of the baby will be checked.  At your later prenatal visits, your health care provider will check how you are doing and how your baby is developing. You may have a number of tests done as your pregnancy progresses.  Ultrasound exams are often used to check on your baby's growth and health.  You may have more urine and blood tests, as well as special tests, if needed. These may include amniocentesis to examine fluid in the pregnancy sac, stress tests to check how the baby responds to contractions, or a  biophysical profile to measure your baby's well-being. Your health care provider will explain the tests and why they are necessary.  You should be tested for high blood sugar (gestational diabetes) between the 24th and 28th weeks of your pregnancy.  You should discuss with your health care provider your plans to breastfeed or bottle-feed your baby.  Each visit is also a chance for you to learn about staying healthy during pregnancy and to ask questions. Document Released: 02/24/2003 Document Revised: 02/26/2013 Document Reviewed: 05/08/2013 [facility] Patient Information 2015 [place], [place]. This information is not intended to replace advice given to you by your health care provider. Make sure you discuss any questions you have with your health care provider. Headaches, Frequently Asked Questions MIGRAINE HEADACHES Q: What is migraine? What causes it? How can I treat it? A: Generally, migraine headaches begin as a dull ache. Then they develop into a constant, throbbing, and pulsating pain. You may experience pain at the temples. You may experience pain at the front or back of one or both sides of the head. The pain is usually accompanied by a combination of:  Nausea.  Vomiting.  Sensitivity to light and noise. Some people (about 15%) experience an aura (see below) before an attack. The cause of migraine is believed to be chemical reactions in the brain. Treatment for migraine may include over-the-counter or prescription medications. It may also include self-help techniques. These include relaxation training and biofeedback.  Q: What is an aura? A: About 15% of people with migraine get an "aura". This is a sign of neurological symptoms that occur before a migraine headache. You may see wavy or jagged lines, dots, or flashing lights. You might experience tunnel vision or blind spots in one or both eyes. The aura can include visual or auditory hallucinations (something imagined). It may include  disruptions in smell (such as strange odors), taste or touch. Other symptoms include:  Numbness.  A "pins and needles" sensation.  Difficulty in recalling or speaking the correct word. These neurological events may last as long as 60 minutes. These symptoms will fade as the headache begins. Q: What is a trigger? A: Certain physical or environmental factors can lead to or "trigger" a migraine. These include:  Foods.  Hormonal changes.  Weather.  Stress. It is important to remember that triggers are different for everyone. To help prevent migraine attacks, you need to figure out which triggers affect you. Keep a headache diary. This is a good way to track triggers. The diary will help you talk  to your healthcare professional about your condition. Q: Does weather affect migraines? A: Bright sunshine, hot, humid conditions, and drastic changes in barometric pressure may lead to, or "trigger," a migraine attack in some people. But studies have shown that weather does not act as a trigger for everyone with migraines. Q: What is the link between migraine and hormones? A: Hormones start and regulate many of your body's functions. Hormones keep your body in balance within a constantly changing environment. The levels of hormones in your body are unbalanced at times. Examples are during menstruation, pregnancy, or menopause. That can lead to a migraine attack. In fact, about three quarters of all women with migraine report that their attacks are related to the menstrual cycle.  Q: Is there an increased risk of stroke for migraine sufferers? A: The likelihood of a migraine attack causing a stroke is very remote. That is not to say that migraine sufferers cannot have a stroke associated with their migraines. In persons under age 63, the most common associated factor for stroke is migraine headache. But over the course of a person's normal life span, the occurrence of migraine headache may actually be  associated with a reduced risk of dying from cerebrovascular disease due to stroke.  Q: What are acute medications for migraine? A: Acute medications are used to treat the pain of the headache after it has started. Examples over-the-counter medications, NSAIDs, ergots, and triptans.  Q: What are the triptans? A: Triptans are the newest class of abortive medications. They are specifically targeted to treat migraine. Triptans are vasoconstrictors. They moderate some chemical reactions in the brain. The triptans work on receptors in your brain. Triptans help to restore the balance of a neurotransmitter called serotonin. Fluctuations in levels of serotonin are thought to be a main cause of migraine.  Q: Are over-the-counter medications for migraine effective? A: Over-the-counter, or "OTC," medications may be effective in relieving mild to moderate pain and associated symptoms of migraine. But you should see your caregiver before beginning any treatment regimen for migraine.  Q: What are preventive medications for migraine? A: Preventive medications for migraine are sometimes referred to as "prophylactic" treatments. They are used to reduce the frequency, severity, and length of migraine attacks. Examples of preventive medications include antiepileptic medications, antidepressants, beta-blockers, calcium channel blockers, and NSAIDs (nonsteroidal anti-inflammatory drugs). Q: Why are anticonvulsants used to treat migraine? A: During the past few years, there has been an increased interest in antiepileptic drugs for the prevention of migraine. They are sometimes referred to as "anticonvulsants". Both epilepsy and migraine may be caused by similar reactions in the brain.  Q: Why are antidepressants used to treat migraine? A: Antidepressants are typically used to treat people with depression. They may reduce migraine frequency by regulating chemical levels, such as serotonin, in the brain.  Q: What alternative  therapies are used to treat migraine? A: The term "alternative therapies" is often used to describe treatments considered outside the scope of conventional Western medicine. Examples of alternative therapy include acupuncture, acupressure, and yoga. Another common alternative treatment is herbal therapy. Some herbs are believed to relieve headache pain. Always discuss alternative therapies with your caregiver before proceeding. Some herbal products contain arsenic and other toxins. TENSION HEADACHES Q: What is a tension-type headache? What causes it? How can I treat it? A: Tension-type headaches occur randomly. They are often the result of temporary stress, anxiety, fatigue, or anger. Symptoms include soreness in your temples, a tightening band-like sensation around your head (  a "vice-like" ache). Symptoms can also include a pulling feeling, pressure sensations, and contracting head and neck muscles. The headache begins in your forehead, temples, or the back of your head and neck. Treatment for tension-type headache may include over-the-counter or prescription medications. Treatment may also include self-help techniques such as relaxation training and biofeedback. CLUSTER HEADACHES Q: What is a cluster headache? What causes it? How can I treat it? A: Cluster headache gets its name because the attacks come in groups. The pain arrives with little, if any, warning. It is usually on one side of the head. A tearing or bloodshot eye and a runny nose on the same side of the headache may also accompany the pain. Cluster headaches are believed to be caused by chemical reactions in the brain. They have been described as the most severe and intense of any headache type. Treatment for cluster headache includes prescription medication and oxygen. SINUS HEADACHES Q: What is a sinus headache? What causes it? How can I treat it? A: When a cavity in the bones of the face and skull (a sinus) becomes inflamed, the  inflammation will cause localized pain. This condition is usually the result of an allergic reaction, a tumor, or an infection. If your headache is caused by a sinus blockage, such as an infection, you will probably have a fever. An x-ray will confirm a sinus blockage. Your caregiver's treatment might include antibiotics for the infection, as well as antihistamines or decongestants.  REBOUND HEADACHES Q: What is a rebound headache? What causes it? How can I treat it? A: A pattern of taking acute headache medications too often can lead to a condition known as "rebound headache." A pattern of taking too much headache medication includes taking it more than 2 days per week or in excessive amounts. That means more than the label or a caregiver advises. With rebound headaches, your medications not only stop relieving pain, they actually begin to cause headaches. Doctors treat rebound headache by tapering the medication that is being overused. Sometimes your caregiver will gradually substitute a different type of treatment or medication. Stopping may be a challenge. Regularly overusing a medication increases the potential for serious side effects. Consult a caregiver if you regularly use headache medications more than 2 days per week or more than the label advises. ADDITIONAL QUESTIONS AND ANSWERS Q: What is biofeedback? A: Biofeedback is a self-help treatment. Biofeedback uses special equipment to monitor your body's involuntary physical responses. Biofeedback monitors:  Breathing.  Pulse.  Heart rate.  Temperature.  Muscle tension.  Brain activity. Biofeedback helps you refine and perfect your relaxation exercises. You learn to control the physical responses that are related to stress. Once the technique has been mastered, you do not need the equipment any more. Q: Are headaches hereditary? A: Four out of five (80%) of people that suffer report a family history of migraine. Scientists are not sure  if this is genetic or a family predisposition. Despite the uncertainty, a child has a 50% chance of having migraine if one parent suffers. The child has a 75% chance if both parents suffer.  Q: Can children get headaches? A: By the time they reach high school, most young people have experienced some type of headache. Many safe and effective approaches or medications can prevent a headache from occurring or stop it after it has begun.  Q: What type of doctor should I see to diagnose and treat my headache? A: Start with your primary caregiver. Discuss his or  her experience and approach to headaches. Discuss methods of classification, diagnosis, and treatment. Your caregiver may decide to recommend you to a headache specialist, depending upon your symptoms or other physical conditions. Having diabetes, allergies, etc., may require a more comprehensive and inclusive approach to your headache. The National Headache Foundation will provide, upon request, a list of [facility] physician members in your state. Document Released: 05/14/2003 Document Revised: 05/16/2011 Document Reviewed: 10/22/2007 [facility] Patient Information 2015 [place]. This information is not intended to replace advice given to you by your health care provider. Make sure you discuss any questions you have with your health care provider.

## 2014-06-08 NOTE — MAU Note (Signed)
Pt presents to [facility]U with complaints of a severe headache since yesterday. Denies any vaginal bleeding or discharge

## 2014-06-12 ENCOUNTER — Other Ambulatory Visit (HOSPITAL_COMMUNITY): Payer: Self-pay | Admitting: Nurse Practitioner

## 2014-06-12 DIAGNOSIS — Z3682 Encounter for antenatal screening for nuchal translucency: Secondary | ICD-10-CM

## 2014-06-12 DIAGNOSIS — Z3A12 12 weeks gestation of pregnancy: Secondary | ICD-10-CM

## 2014-06-12 LAB — SICKLE CELL SCREEN: SICKLE CELL SCREEN: NORMAL

## 2014-06-12 LAB — OB RESULTS CONSOLE HIV ANTIBODY (ROUTINE TESTING): HIV: NONREACTIVE

## 2014-06-23 ENCOUNTER — Ambulatory Visit: Payer: Medicaid Other | Admitting: *Deleted

## 2014-06-23 ENCOUNTER — Encounter: Payer: Self-pay | Admitting: *Deleted

## 2014-06-23 VITALS — Ht <= 58 in | Wt 349.1 lb

## 2014-06-23 DIAGNOSIS — O24911 Unspecified diabetes mellitus in pregnancy, first trimester: Secondary | ICD-10-CM

## 2014-06-23 LAB — POCT URINALYSIS DIP (DEVICE)
Bilirubin Urine: NEGATIVE
GLUCOSE, UA: NEGATIVE mg/dL
KETONES UR: NEGATIVE mg/dL
Nitrite: NEGATIVE
Protein, ur: 30 mg/dL — AB
SPECIFIC GRAVITY, URINE: 1.02 (ref 1.005–1.030)
Urobilinogen, UA: 0.2 mg/dL (ref 0.0–1.0)
pH: 7 (ref 5.0–8.0)

## 2014-06-23 MED ORDER — PRENATAL COMPLETE 14-0.4 MG PO TABS: 1.0000 | ORAL_TABLET | Freq: Every day | ORAL | Status: DC

## 2014-06-23 NOTE — Progress Notes (Signed)
Nutrition note: GDM diet education Pt was obese prior to pregnancy & is newly diagnosed with GDM. Pt has gained 13.1# @ 2547w4d, which is > expected. Pt reports eating 3-4 meals & 1 snack/d. Diet appears high in juice & sugar sweetened beverages. Pt is taking a PNV. Pt reports no N&V or heartburn. Pt reports no walking or physical activity. Pt received verbal & written education on GDM diet. Discussed portion sizes. Encouraged ~30 mins of walking/d. Encouraged decreasing to DC juice/ sugar sweetened beverages. Discussed wt gain goals of 11-20# or 0.5#/wk in 2nd & 3rd trimester. Pt agrees to follow GDM diet with 3 meals & 1-3 snacks/d with proper CHO/ protein combination. Pt does not have WIC but plans to apply. Pt plans to BF. F/u in 2-4 wks Blondell RevealLaura Gusta Marksberry, MS, RDN, IBCLC

## 2014-06-23 NOTE — Progress Notes (Signed)
Diabetes education 06/25/14 @ 430p with Harriett SineNancy @ [address].

## 2014-06-25 ENCOUNTER — Encounter: Payer: Self-pay | Admitting: *Deleted

## 2014-06-25 ENCOUNTER — Encounter: Payer: Medicaid Other | Attending: Obstetrics & Gynecology

## 2014-06-25 VITALS — Ht <= 58 in | Wt 352.6 lb

## 2014-06-25 DIAGNOSIS — Z713 Dietary counseling and surveillance: Secondary | ICD-10-CM | POA: Insufficient documentation

## 2014-06-25 DIAGNOSIS — R7309 Other abnormal glucose: Secondary | ICD-10-CM

## 2014-06-25 DIAGNOSIS — R7302 Impaired glucose tolerance (oral): Secondary | ICD-10-CM | POA: Diagnosis present

## 2014-06-25 NOTE — Progress Notes (Signed)
  Patient was seen on 06/25/14 for Gestational Diabetes self-management class at the [facility]. The following learning objectives were met by the patient during this course:   States the definition of Gestational Diabetes  States why dietary management is important in controlling blood glucose  Describes the effects each nutrient has on blood glucose levels  Demonstrates ability to create a balanced meal plan  Demonstrates carbohydrate counting   States when to check blood glucose levels  Demonstrates proper blood glucose monitoring techniques  States the effect of stress and exercise on blood glucose levels  States the importance of limiting caffeine and abstaining from alcohol and smoking  Blood glucose monitor given: Accu Chek Nano BG Monitoring Kit Lot # [id number]  Exp: 03/07/15 Blood glucose reading: 138 mg/dl  Patient instructed to monitor glucose levels: FBS: 60 - <90 1 hour: <140 2 hour: <120  *Patient received handouts:  Nutrition Diabetes and Pregnancy  Carbohydrate Counting List  Patient will be seen for follow-up as needed.

## 2014-06-30 ENCOUNTER — Encounter: Payer: Medicaid Other | Admitting: Family Medicine

## 2014-07-02 ENCOUNTER — Other Ambulatory Visit (HOSPITAL_COMMUNITY): Payer: Self-pay | Admitting: Nurse Practitioner

## 2014-07-02 ENCOUNTER — Ambulatory Visit (HOSPITAL_COMMUNITY)
Admission: RE | Admit: 2014-07-02 | Discharge: 2014-07-02 | Disposition: A | Discharge status: Home / Self Care | Payer: Medicaid Other | Source: Ambulatory Visit | Attending: Nurse Practitioner | Admitting: Nurse Practitioner

## 2014-07-02 ENCOUNTER — Other Ambulatory Visit (HOSPITAL_COMMUNITY): Payer: Self-pay

## 2014-07-02 DIAGNOSIS — O99210 Obesity complicating pregnancy, unspecified trimester: Secondary | ICD-10-CM | POA: Insufficient documentation

## 2014-07-02 DIAGNOSIS — E119 Type 2 diabetes mellitus without complications: Secondary | ICD-10-CM | POA: Insufficient documentation

## 2014-07-02 DIAGNOSIS — Z36 Encounter for antenatal screening of mother: Secondary | ICD-10-CM | POA: Diagnosis not present

## 2014-07-02 DIAGNOSIS — O24111 Pre-existing diabetes mellitus, type 2, in pregnancy, first trimester: Secondary | ICD-10-CM | POA: Insufficient documentation

## 2014-07-02 DIAGNOSIS — Z3682 Encounter for antenatal screening for nuchal translucency: Secondary | ICD-10-CM

## 2014-07-02 DIAGNOSIS — O10011 Pre-existing essential hypertension complicating pregnancy, first trimester: Secondary | ICD-10-CM | POA: Diagnosis present

## 2014-07-02 DIAGNOSIS — Z3A12 12 weeks gestation of pregnancy: Secondary | ICD-10-CM

## 2014-07-02 DIAGNOSIS — O169 Unspecified maternal hypertension, unspecified trimester: Secondary | ICD-10-CM | POA: Insufficient documentation

## 2014-07-14 ENCOUNTER — Ambulatory Visit (INDEPENDENT_AMBULATORY_CARE_PROVIDER_SITE_OTHER): Payer: Self-pay | Admitting: Family Medicine

## 2014-07-14 ENCOUNTER — Encounter: Payer: Self-pay | Admitting: Family Medicine

## 2014-07-14 VITALS — BP 127/62 | HR 75 | Temp 98.8°F | Wt 354.6 lb

## 2014-07-14 DIAGNOSIS — O24419 Gestational diabetes mellitus in pregnancy, unspecified control: Secondary | ICD-10-CM

## 2014-07-14 DIAGNOSIS — O0991 Supervision of high risk pregnancy, unspecified, first trimester: Secondary | ICD-10-CM

## 2014-07-14 DIAGNOSIS — O24111 Pre-existing diabetes mellitus, type 2, in pregnancy, first trimester: Secondary | ICD-10-CM

## 2014-07-14 DIAGNOSIS — Z98891 History of uterine scar from previous surgery: Secondary | ICD-10-CM | POA: Insufficient documentation

## 2014-07-14 LAB — COMPREHENSIVE METABOLIC PANEL
ALBUMIN: 3.5 g/dL (ref 3.5–5.2)
ALT: 9 U/L (ref 0–35)
AST: 11 U/L (ref 0–37)
Alkaline Phosphatase: 43 U/L (ref 39–117)
BUN: 4 mg/dL — ABNORMAL LOW (ref 6–23)
CALCIUM: 9.3 mg/dL (ref 8.4–10.5)
CHLORIDE: 103 meq/L (ref 96–112)
CO2: 24 mEq/L (ref 19–32)
Creat: 0.54 mg/dL (ref 0.50–1.10)
Glucose, Bld: 87 mg/dL (ref 70–99)
Potassium: 4.3 mEq/L (ref 3.5–5.3)
SODIUM: 136 meq/L (ref 135–145)
TOTAL PROTEIN: 6.7 g/dL (ref 6.0–8.3)
Total Bilirubin: 0.2 mg/dL (ref 0.2–1.2)

## 2014-07-14 LAB — POCT URINALYSIS DIP (DEVICE)
Bilirubin Urine: NEGATIVE
GLUCOSE, UA: NEGATIVE mg/dL
Ketones, ur: NEGATIVE mg/dL
NITRITE: NEGATIVE
PROTEIN: NEGATIVE mg/dL
Specific Gravity, Urine: 1.025 (ref 1.005–1.030)
UROBILINOGEN UA: 0.2 mg/dL (ref 0.0–1.0)
pH: 7 (ref 5.0–8.0)

## 2014-07-14 MED ORDER — ASPIRIN EC 81 MG PO TBEC: 81.0000 mg | DELAYED_RELEASE_TABLET | Freq: Every day | ORAL | Status: DC

## 2014-07-14 MED ORDER — GLUCOSE BLOOD VI STRP: ORAL_STRIP | Status: DC

## 2014-07-14 MED ORDER — ACCU-CHEK FASTCLIX LANCETS MISC
1.0000 | Freq: Four times a day (QID) | Status: DC
Start: 2014-07-14 — End: 2014-12-01

## 2014-07-14 NOTE — Progress Notes (Signed)
Initial OB appt/ 

## 2014-07-14 NOTE — Progress Notes (Signed)
Referred from HD for DM2.  Discussed importance of blood sugar testing, goals, risks of DM in pregnancy, need for additional labs.   Will get TSH, 24 hr urine, CMP.  Referred to Opthamology.  Fetal echo ordered for around 24-26 weeks. Screening US obtained. F/u 1 week to review blood sugars.

## 2014-07-14 NOTE — Patient Instructions (Signed)
Second Trimester of Pregnancy The second trimester is from week 13 through week 28, months 4 through 6. The second trimester is often a time when you feel your best. Your body has also adjusted to being pregnant, and you begin to feel better physically. Usually, morning sickness has lessened or quit completely, you may have more energy, and you may have an increase in appetite. The second trimester is also a time when the fetus is growing rapidly. At the end of the sixth month, the fetus is about 9 inches long and weighs about 1 pounds. You will likely begin to feel the baby move (quickening) between 18 and 20 weeks of the pregnancy. BODY CHANGES Your body goes through many changes during pregnancy. The changes vary from woman to woman.   Your weight will continue to increase. You will notice your lower abdomen bulging out.  You may begin to get stretch marks on your hips, abdomen, and breasts.  You may develop headaches that can be relieved by medicines approved by your health care provider.  You may urinate more often because the fetus is pressing on your bladder.  You may develop or continue to have heartburn as a result of your pregnancy.  You may develop constipation because certain hormones are causing the muscles that push waste through your intestines to slow down.  You may develop hemorrhoids or swollen, bulging veins (varicose veins).  You may have back pain because of the weight gain and pregnancy hormones relaxing your joints between the bones in your pelvis and as a result of a shift in weight and the muscles that support your balance.  Your breasts will continue to grow and be tender.  Your gums may bleed and may be sensitive to brushing and flossing.  Dark spots or blotches (chloasma, mask of pregnancy) may develop on your face. This will likely fade after the baby is born.  A dark line from your belly button to the pubic area (linea nigra) may appear. This will likely fade  after the baby is born.  You may have changes in your hair. These can include thickening of your hair, rapid growth, and changes in texture. Some women also have hair loss during or after pregnancy, or hair that feels dry or thin. Your hair will most likely return to normal after your baby is born. WHAT TO EXPECT AT YOUR PRENATAL VISITS During a routine prenatal visit:  You will be weighed to make sure you and the fetus are growing normally.  Your blood pressure will be taken.  Your abdomen will be measured to track your baby's growth.  The fetal heartbeat will be listened to.  Any test results from the previous visit will be discussed. Your health care provider may ask you:  How you are feeling.  If you are feeling the baby move.  If you have had any abnormal symptoms, such as leaking fluid, bleeding, severe headaches, or abdominal cramping.  If you have any questions. Other tests that may be performed during your second trimester include:  Blood tests that check for:  Low iron levels (anemia).  Gestational diabetes (between 24 and 28 weeks).  Rh antibodies.  Urine tests to check for infections, diabetes, or protein in the urine.  An ultrasound to confirm the proper growth and development of the baby.  An amniocentesis to check for possible genetic problems.  Fetal screens for spina bifida and Down syndrome. HOME CARE INSTRUCTIONS   Avoid all smoking, herbs, alcohol, and unprescribed   drugs. These chemicals affect the formation and growth of the baby.  Follow your health care provider's instructions regarding medicine use. There are medicines that are either safe or unsafe to take during pregnancy.  Exercise only as directed by your health care provider. Experiencing uterine cramps is a good sign to stop exercising.  Continue to eat regular, healthy meals.  Wear a good support bra for breast tenderness.  Do not use hot tubs, steam rooms, or saunas.  Wear your  seat belt at all times when driving.  Avoid raw meat, uncooked cheese, cat litter boxes, and soil used by cats. These carry germs that can cause birth defects in the baby.  Take your prenatal vitamins.  Try taking a stool softener (if your health care provider approves) if you develop constipation. Eat more high-fiber foods, such as fresh vegetables or fruit and whole grains. Drink plenty of fluids to keep your urine clear or pale yellow.  Take warm sitz baths to soothe any pain or discomfort caused by hemorrhoids. Use hemorrhoid cream if your health care provider approves.  If you develop varicose veins, wear support hose. Elevate your feet for 15 minutes, 3-4 times a day. Limit salt in your diet.  Avoid heavy lifting, wear low heel shoes, and practice good posture.  Rest with your legs elevated if you have leg cramps or low back pain.  Visit your dentist if you have not gone yet during your pregnancy. Use a soft toothbrush to brush your teeth and be gentle when you floss.  A sexual relationship may be continued unless your health care provider directs you otherwise.  Continue to go to all your prenatal visits as directed by your health care provider. SEEK MEDICAL CARE IF:   You have dizziness.  You have mild pelvic cramps, pelvic pressure, or nagging pain in the abdominal area.  You have persistent nausea, vomiting, or diarrhea.  You have a bad smelling vaginal discharge.  You have pain with urination. SEEK IMMEDIATE MEDICAL CARE IF:   You have a fever.  You are leaking fluid from your vagina.  You have spotting or bleeding from your vagina.  You have severe abdominal cramping or pain.  You have rapid weight gain or loss.  You have shortness of breath with chest pain.  You notice sudden or extreme swelling of your face, hands, ankles, feet, or legs.  You have not felt your baby move in over an hour.  You have severe headaches that do not go away with  medicine.  You have vision changes. Document Released: 02/15/2001 Document Revised: 02/26/2013 Document Reviewed: 04/24/2012 [employer] Patient Information 2015 [employer]. This information is not intended to replace advice given to you by your health care provider. Make sure you discuss any questions you have with your health care provider.  

## 2014-07-14 NOTE — Progress Notes (Signed)
Mod leuks on UA 

## 2014-07-15 LAB — HEMOGLOBIN A1C
HEMOGLOBIN A1C: 5.7 % — AB (ref <5.7)
MEAN PLASMA GLUCOSE: 117 mg/dL — AB (ref <117)

## 2014-07-15 LAB — TSH: TSH: 1.237 u[IU]/mL (ref 0.350–4.500)

## 2014-07-16 NOTE — Addendum Note (Signed)
Addended by: Darrel HooverASSETTE, KELLY P on: 07/16/2014 12:37 PM   Modules accepted: Orders

## 2014-07-17 LAB — CREATININE CLEARANCE, URINE, 24 HOUR
CREAT CLEAR: 266 mL/min — AB (ref 75–115)
CREATININE: 0.54 mg/dL (ref 0.50–1.10)
Creatinine, 24H Ur: 2069 mg/d — ABNORMAL HIGH (ref 700–1800)
Creatinine, Urine: 133.5 mg/dL

## 2014-07-17 LAB — PROTEIN, URINE, 24 HOUR
PROTEIN, URINE: 9 mg/dL (ref 5–24)
Protein, 24H Urine: 140 mg/d (ref <150)

## 2014-07-21 ENCOUNTER — Encounter: Payer: Self-pay | Admitting: Family Medicine

## 2014-07-21 ENCOUNTER — Encounter: Payer: Self-pay | Admitting: Obstetrics & Gynecology

## 2014-07-21 DIAGNOSIS — Z2839 Other underimmunization status: Secondary | ICD-10-CM | POA: Insufficient documentation

## 2014-07-21 DIAGNOSIS — O09899 Supervision of other high risk pregnancies, unspecified trimester: Secondary | ICD-10-CM | POA: Insufficient documentation

## 2014-07-21 DIAGNOSIS — O99332 Smoking (tobacco) complicating pregnancy, second trimester: Secondary | ICD-10-CM | POA: Insufficient documentation

## 2014-07-21 DIAGNOSIS — Z283 Underimmunization status: Secondary | ICD-10-CM

## 2014-07-21 DIAGNOSIS — O99322 Drug use complicating pregnancy, second trimester: Secondary | ICD-10-CM | POA: Insufficient documentation

## 2014-07-21 DIAGNOSIS — F17200 Nicotine dependence, unspecified, uncomplicated: Secondary | ICD-10-CM | POA: Insufficient documentation

## 2014-07-28 ENCOUNTER — Encounter: Payer: Medicaid Other | Attending: Obstetrics & Gynecology | Admitting: *Deleted

## 2014-07-28 ENCOUNTER — Ambulatory Visit (INDEPENDENT_AMBULATORY_CARE_PROVIDER_SITE_OTHER): Payer: Self-pay | Admitting: Family Medicine

## 2014-07-28 VITALS — BP 141/73 | HR 85 | Wt 356.5 lb

## 2014-07-28 DIAGNOSIS — Z713 Dietary counseling and surveillance: Secondary | ICD-10-CM | POA: Insufficient documentation

## 2014-07-28 DIAGNOSIS — O24111 Pre-existing diabetes mellitus, type 2, in pregnancy, first trimester: Secondary | ICD-10-CM

## 2014-07-28 DIAGNOSIS — R7302 Impaired glucose tolerance (oral): Secondary | ICD-10-CM | POA: Diagnosis present

## 2014-07-28 DIAGNOSIS — O24911 Unspecified diabetes mellitus in pregnancy, first trimester: Secondary | ICD-10-CM

## 2014-07-28 DIAGNOSIS — O0991 Supervision of high risk pregnancy, unspecified, first trimester: Secondary | ICD-10-CM

## 2014-07-28 LAB — POCT URINALYSIS DIP (DEVICE)
BILIRUBIN URINE: NEGATIVE
Glucose, UA: NEGATIVE mg/dL
Ketones, ur: NEGATIVE mg/dL
LEUKOCYTES UA: NEGATIVE
NITRITE: NEGATIVE
PROTEIN: NEGATIVE mg/dL
Specific Gravity, Urine: 1.015 (ref 1.005–1.030)
UROBILINOGEN UA: 0.2 mg/dL (ref 0.0–1.0)
pH: 6 (ref 5.0–8.0)

## 2014-07-28 MED ORDER — PRENATAL COMPLETE 14-0.4 MG PO TABS: 1.0000 | ORAL_TABLET | Freq: Every day | ORAL | Status: DC

## 2014-07-28 MED ORDER — ASPIRIN EC 81 MG PO TBEC: 81.0000 mg | DELAYED_RELEASE_TABLET | Freq: Every day | ORAL | Status: DC

## 2014-07-28 NOTE — Progress Notes (Signed)
Patient previously counseled diabetes complicated by pregnancy. Her Medicaid has not become effective as of this date. Dispensed True Track glucometer  Lot# [id number], EXP 2016/10/04. When Medicaid card comes through we will return to Accu-Chek Nano.

## 2014-07-28 NOTE — Patient Instructions (Signed)
Second Trimester of Pregnancy The second trimester is from week 13 through week 28, months 4 through 6. The second trimester is often a time when you feel your best. Your body has also adjusted to being pregnant, and you begin to feel better physically. Usually, morning sickness has lessened or quit completely, you may have more energy, and you may have an increase in appetite. The second trimester is also a time when the fetus is growing rapidly. At the end of the sixth month, the fetus is about 9 inches long and weighs about 1 pounds. You will likely begin to feel the baby move (quickening) between 18 and 20 weeks of the pregnancy. BODY CHANGES Your body goes through many changes during pregnancy. The changes vary from woman to woman.   Your weight will continue to increase. You will notice your lower abdomen bulging out.  You may begin to get stretch marks on your hips, abdomen, and breasts.  You may develop headaches that can be relieved by medicines approved by your health care provider.  You may urinate more often because the fetus is pressing on your bladder.  You may develop or continue to have heartburn as a result of your pregnancy.  You may develop constipation because certain hormones are causing the muscles that push waste through your intestines to slow down.  You may develop hemorrhoids or swollen, bulging veins (varicose veins).  You may have back pain because of the weight gain and pregnancy hormones relaxing your joints between the bones in your pelvis and as a result of a shift in weight and the muscles that support your balance.  Your breasts will continue to grow and be tender.  Your gums may bleed and may be sensitive to brushing and flossing.  Dark spots or blotches (chloasma, mask of pregnancy) may develop on your face. This will likely fade after the baby is born.  A dark line from your belly button to the pubic area (linea nigra) may appear. This will likely fade  after the baby is born.  You may have changes in your hair. These can include thickening of your hair, rapid growth, and changes in texture. Some women also have hair loss during or after pregnancy, or hair that feels dry or thin. Your hair will most likely return to normal after your baby is born. WHAT TO EXPECT AT YOUR PRENATAL VISITS During a routine prenatal visit:  You will be weighed to make sure you and the fetus are growing normally.  Your blood pressure will be taken.  Your abdomen will be measured to track your baby's growth.  The fetal heartbeat will be listened to.  Any test results from the previous visit will be discussed. Your health care provider may ask you:  How you are feeling.  If you are feeling the baby move.  If you have had any abnormal symptoms, such as leaking fluid, bleeding, severe headaches, or abdominal cramping.  If you have any questions. Other tests that may be performed during your second trimester include:  Blood tests that check for:  Low iron levels (anemia).  Gestational diabetes (between 24 and 28 weeks).  Rh antibodies.  Urine tests to check for infections, diabetes, or protein in the urine.  An ultrasound to confirm the proper growth and development of the baby.  An amniocentesis to check for possible genetic problems.  Fetal screens for spina bifida and Down syndrome. HOME CARE INSTRUCTIONS   Avoid all smoking, herbs, alcohol, and unprescribed   drugs. These chemicals affect the formation and growth of the baby.  Follow your health care provider's instructions regarding medicine use. There are medicines that are either safe or unsafe to take during pregnancy.  Exercise only as directed by your health care provider. Experiencing uterine cramps is a good sign to stop exercising.  Continue to eat regular, healthy meals.  Wear a good support bra for breast tenderness.  Do not use hot tubs, steam rooms, or saunas.  Wear your  seat belt at all times when driving.  Avoid raw meat, uncooked cheese, cat litter boxes, and soil used by cats. These carry germs that can cause birth defects in the baby.  Take your prenatal vitamins.  Try taking a stool softener (if your health care provider approves) if you develop constipation. Eat more high-fiber foods, such as fresh vegetables or fruit and whole grains. Drink plenty of fluids to keep your urine clear or pale yellow.  Take warm sitz baths to soothe any pain or discomfort caused by hemorrhoids. Use hemorrhoid cream if your health care provider approves.  If you develop varicose veins, wear support hose. Elevate your feet for 15 minutes, 3-4 times a day. Limit salt in your diet.  Avoid heavy lifting, wear low heel shoes, and practice good posture.  Rest with your legs elevated if you have leg cramps or low back pain.  Visit your dentist if you have not gone yet during your pregnancy. Use a soft toothbrush to brush your teeth and be gentle when you floss.  A sexual relationship may be continued unless your health care provider directs you otherwise.  Continue to go to all your prenatal visits as directed by your health care provider. SEEK MEDICAL CARE IF:   You have dizziness.  You have mild pelvic cramps, pelvic pressure, or nagging pain in the abdominal area.  You have persistent nausea, vomiting, or diarrhea.  You have a bad smelling vaginal discharge.  You have pain with urination. SEEK IMMEDIATE MEDICAL CARE IF:   You have a fever.  You are leaking fluid from your vagina.  You have spotting or bleeding from your vagina.  You have severe abdominal cramping or pain.  You have rapid weight gain or loss.  You have shortness of breath with chest pain.  You notice sudden or extreme swelling of your face, hands, ankles, feet, or legs.  You have not felt your baby move in over an hour.  You have severe headaches that do not go away with  medicine.  You have vision changes. Document Released: 02/15/2001 Document Revised: 02/26/2013 Document Reviewed: 04/24/2012 [employer] Patient Information 2015 [employer]. This information is not intended to replace advice given to you by your health care provider. Make sure you discuss any questions you have with your health care provider.  

## 2014-07-28 NOTE — Progress Notes (Signed)
Hasn't checked [facility] since 5/9.  Medicaid hasn't become effective yet.  Pt to see Harriett Sineancy Will get detailed US. F/u 1 week with Harriett SineNancy

## 2014-07-28 NOTE — Progress Notes (Signed)
U/S 08/07/14 @ 1245p with Radiology.  Opth appt 09/02/14 @ 8a with Dr. Karleen HampshireSpencer.  Fetal echo 09/25/14 @ 8a with [facility] (ROI obtained, records to be faxed [phone] per request).

## 2014-07-28 NOTE — Progress Notes (Signed)
Needs refill on pnv 

## 2014-08-05 ENCOUNTER — Encounter (HOSPITAL_COMMUNITY): Payer: Self-pay | Admitting: *Deleted

## 2014-08-05 ENCOUNTER — Ambulatory Visit: Payer: Self-pay

## 2014-08-05 ENCOUNTER — Inpatient Hospital Stay (HOSPITAL_COMMUNITY)
Admission: AD | Admit: 2014-08-05 | Discharge: 2014-08-05 | Disposition: A | Discharge status: Home / Self Care | Payer: Medicaid Other | Source: Ambulatory Visit | Attending: Family Medicine | Admitting: Family Medicine

## 2014-08-05 DIAGNOSIS — F17210 Nicotine dependence, cigarettes, uncomplicated: Secondary | ICD-10-CM | POA: Diagnosis not present

## 2014-08-05 DIAGNOSIS — T781XXA Other adverse food reactions, not elsewhere classified, initial encounter: Secondary | ICD-10-CM | POA: Insufficient documentation

## 2014-08-05 DIAGNOSIS — Z8249 Family history of ischemic heart disease and other diseases of the circulatory system: Secondary | ICD-10-CM | POA: Insufficient documentation

## 2014-08-05 DIAGNOSIS — O99332 Smoking (tobacco) complicating pregnancy, second trimester: Secondary | ICD-10-CM | POA: Diagnosis not present

## 2014-08-05 DIAGNOSIS — T7840XA Allergy, unspecified, initial encounter: Secondary | ICD-10-CM | POA: Diagnosis not present

## 2014-08-05 DIAGNOSIS — R21 Rash and other nonspecific skin eruption: Secondary | ICD-10-CM | POA: Diagnosis present

## 2014-08-05 DIAGNOSIS — O9989 Other specified diseases and conditions complicating pregnancy, childbirth and the puerperium: Secondary | ICD-10-CM | POA: Insufficient documentation

## 2014-08-05 DIAGNOSIS — X58XXXA Exposure to other specified factors, initial encounter: Secondary | ICD-10-CM | POA: Insufficient documentation

## 2014-08-05 DIAGNOSIS — Z833 Family history of diabetes mellitus: Secondary | ICD-10-CM | POA: Insufficient documentation

## 2014-08-05 DIAGNOSIS — Z3A17 17 weeks gestation of pregnancy: Secondary | ICD-10-CM | POA: Diagnosis not present

## 2014-08-05 MED ORDER — SODIUM CHLORIDE 0.9 % IV SOLN
Freq: Once | INTRAVENOUS | Status: AC
  Administered 2014-08-05: 12:00:00 via INTRAVENOUS

## 2014-08-05 MED ORDER — DIPHENHYDRAMINE HCL 50 MG/ML IJ SOLN
25.0000 mg | Freq: Once | INTRAMUSCULAR | Status: AC
  Administered 2014-08-05: 25 mg via INTRAMUSCULAR
  Filled 2014-08-05: qty 1

## 2014-08-05 MED ORDER — DIPHENHYDRAMINE HCL 50 MG/ML IJ SOLN
25.0000 mg | Freq: Once | INTRAMUSCULAR | Status: AC
  Administered 2014-08-05: 25 mg via INTRAVENOUS
  Filled 2014-08-05: qty 1

## 2014-08-05 NOTE — MAU Note (Signed)
Urine in lab 

## 2014-08-05 NOTE — MAU Provider Note (Signed)
History     CSN: [id number]  Arrival date and time: 08/05/14 [id number]   First Provider Initiated Contact with Patient 08/05/14 0957      Chief Complaint  Patient presents with  . Allergic Reaction   HPI  Kayla Flynn is a 24 y.o. G1P0 at [id number] here with report of facial rash and swelling that started around 2000 last night.  Began 20-30 minutes after eating [employer] hot chips.  Reports breathing became "tight" and felt flushed.  Applied alcohol to try to help rash.  No report of shortness or breath at this time.    Past Medical History  Diagnosis Date  . Hypertension   . Obesity   . Prediabetes     "prediabetic; controlled w/diet" (07/17/2012)  . Acute appendicitis 07/18/2012  . Obesity, morbid, BMI 50 or higher 07/18/2012  . GDM (gestational diabetes mellitus)     Past Surgical History  Procedure Laterality Date  . Appendectomy  07/17/2012  . Laparoscopic appendectomy N/A 07/17/2012    Procedure: APPENDECTOMY LAPAROSCOPIC;  Surgeon: Liz MaladyBurke E Thompson, MD;  [facility];  Service: General;  Laterality: N/A;    Family History  Problem Relation Age of Onset  . Hypertension Mother   . Diabetes Mother   . Hypertension Maternal Grandmother     History  Substance Use Topics  . Smoking status: Current Every Day Smoker -- 0.10 packs/day for 1.2 years    Types: Cigarettes  . Smokeless tobacco: Never Used  . Alcohol Use: Yes     Comment: last time couple wks ago but not since finding out was pregnant    Allergies: No Known Allergies  Prescriptions prior to admission  Medication Sig Dispense Refill Last Dose  . ACCU-CHEK FASTCLIX LANCETS MISC Inject 1 each into the skin 4 (four) times daily. New DX GDM for testing 4 times daily DX O24.419 (Patient not taking: Reported on 07/28/2014) 102 each 12 Not Taking  . aspirin EC 81 MG tablet Take 1 tablet (81 mg total) by mouth daily. 30 tablet 11   . glucose blood (ACCU-CHEK SMARTVIEW) test strip New DX GDM for testing 4 times daily  DX O24.419 (Patient not taking: Reported on 07/28/2014) 100 each 12 Not Taking  . Prenatal Vit-Fe Fumarate-FA (PRENATAL COMPLETE) 14-0.4 MG TABS Take 1 tablet by mouth daily. 30 each 6     Review of Systems  Constitutional: Negative for fever.  Respiratory: Negative for cough, shortness of breath and wheezing.   Cardiovascular: Negative for chest pain.  Gastrointestinal: Negative for nausea and vomiting.  Skin: Positive for itching (facial) and rash (facial).  All other systems reviewed and are negative.  Physical Exam   Blood pressure 121/61, pulse 98, temperature 98.2 F (36.8 C), temperature source Oral, resp. rate 18, height 4\' 11"  (1.499 m), weight 161.027 kg (355 lb), last menstrual period 01/10/2014.  O2 sat - 99  Physical Exam  Constitutional: She is oriented to person, place, and time. She appears well-developed and well-nourished. No distress.  HENT:  Head: Normocephalic.  Neck: Normal range of motion. Neck supple.  Cardiovascular: Normal rate, regular rhythm and normal heart sounds.   Respiratory: Effort normal and breath sounds normal. No respiratory distress. She has no wheezes. She has no rales.  Genitourinary: No bleeding in the vagina.  Neurological: She is alert and oriented to person, place, and time.  Skin: Skin is warm and dry. Rash (scattered pustule lesions on face) noted.    MAU Course  Procedures 1010 25  mg IM benadryl > pt reports slight relief, but continued itching around mouth 1130 25 mg IV benadryl  1300 Pt reports improvement in symptoms   Assessment and Plan  24 y.o. G1P0 at [redacted]w[redacted]d IUP  Allergic Reaction  Plan: Discharge to home OTC Benadryl as needed Report any respiratory symptoms or worsening of condition  Marlis Edelson, CNM

## 2014-08-05 NOTE — MAU Note (Signed)
"  bumps" first noted and itching started last night. Allover face and arms

## 2014-08-06 ENCOUNTER — Emergency Department (HOSPITAL_COMMUNITY)
Admission: EM | Admit: 2014-08-06 | Discharge: 2014-08-06 | Disposition: A | Discharge status: Home / Self Care | Payer: Medicaid Other | Attending: Emergency Medicine | Admitting: Emergency Medicine

## 2014-08-06 ENCOUNTER — Inpatient Hospital Stay (EMERGENCY_DEPARTMENT_HOSPITAL)
Admission: AD | Admit: 2014-08-06 | Discharge: 2014-08-06 | Disposition: A | Discharge status: Home / Self Care | Payer: Medicaid Other | Source: Ambulatory Visit | Attending: Family Medicine | Admitting: Family Medicine

## 2014-08-06 ENCOUNTER — Encounter (HOSPITAL_COMMUNITY): Payer: Self-pay | Admitting: Emergency Medicine

## 2014-08-06 ENCOUNTER — Encounter (HOSPITAL_COMMUNITY): Payer: Self-pay | Admitting: *Deleted

## 2014-08-06 DIAGNOSIS — R202 Paresthesia of skin: Secondary | ICD-10-CM

## 2014-08-06 DIAGNOSIS — Z720 Tobacco use: Secondary | ICD-10-CM | POA: Insufficient documentation

## 2014-08-06 DIAGNOSIS — Z3A18 18 weeks gestation of pregnancy: Secondary | ICD-10-CM | POA: Diagnosis not present

## 2014-08-06 DIAGNOSIS — R21 Rash and other nonspecific skin eruption: Secondary | ICD-10-CM | POA: Diagnosis not present

## 2014-08-06 DIAGNOSIS — B019 Varicella without complication: Secondary | ICD-10-CM

## 2014-08-06 DIAGNOSIS — B0189 Other varicella complications: Secondary | ICD-10-CM | POA: Diagnosis not present

## 2014-08-06 DIAGNOSIS — O9989 Other specified diseases and conditions complicating pregnancy, childbirth and the puerperium: Secondary | ICD-10-CM | POA: Diagnosis not present

## 2014-08-06 DIAGNOSIS — Z79899 Other long term (current) drug therapy: Secondary | ICD-10-CM | POA: Diagnosis not present

## 2014-08-06 DIAGNOSIS — Z8719 Personal history of other diseases of the digestive system: Secondary | ICD-10-CM | POA: Insufficient documentation

## 2014-08-06 DIAGNOSIS — Z8632 Personal history of gestational diabetes: Secondary | ICD-10-CM | POA: Diagnosis not present

## 2014-08-06 DIAGNOSIS — I10 Essential (primary) hypertension: Secondary | ICD-10-CM | POA: Diagnosis not present

## 2014-08-06 DIAGNOSIS — B029 Zoster without complications: Secondary | ICD-10-CM | POA: Diagnosis not present

## 2014-08-06 LAB — CBC WITH DIFFERENTIAL/PLATELET
Basophils Absolute: 0 10*3/uL (ref 0.0–0.1)
Basophils Relative: 0 % (ref 0–1)
EOS ABS: 0.1 10*3/uL (ref 0.0–0.7)
EOS PCT: 2 % (ref 0–5)
HCT: 33.4 % — ABNORMAL LOW (ref 36.0–46.0)
Hemoglobin: 10.7 g/dL — ABNORMAL LOW (ref 12.0–15.0)
Lymphocytes Relative: 18 % (ref 12–46)
Lymphs Abs: 1.3 10*3/uL (ref 0.7–4.0)
MCH: 30.1 pg (ref 26.0–34.0)
MCHC: 32 g/dL (ref 30.0–36.0)
MCV: 93.8 fL (ref 78.0–100.0)
MONO ABS: 0.6 10*3/uL (ref 0.1–1.0)
Monocytes Relative: 8 % (ref 3–12)
Neutro Abs: 5.3 10*3/uL (ref 1.7–7.7)
Neutrophils Relative %: 72 % (ref 43–77)
PLATELETS: 301 10*3/uL (ref 150–400)
RBC: 3.56 MIL/uL — ABNORMAL LOW (ref 3.87–5.11)
RDW: 13 % (ref 11.5–15.5)
WBC: 7.4 10*3/uL (ref 4.0–10.5)

## 2014-08-06 LAB — COMPREHENSIVE METABOLIC PANEL
ALK PHOS: 57 U/L (ref 38–126)
ALT: 15 U/L (ref 14–54)
AST: 17 U/L (ref 15–41)
Albumin: 3.4 g/dL — ABNORMAL LOW (ref 3.5–5.0)
Anion gap: 9 (ref 5–15)
BILIRUBIN TOTAL: 0.2 mg/dL — AB (ref 0.3–1.2)
BUN: <5 mg/dL — ABNORMAL LOW (ref 6–20)
CHLORIDE: 104 mmol/L (ref 101–111)
CO2: 24 mmol/L (ref 22–32)
CREATININE: 0.39 mg/dL — AB (ref 0.44–1.00)
Calcium: 9.4 mg/dL (ref 8.9–10.3)
GFR calc Af Amer: >60 mL/min (ref >60)
GFR calc non Af Amer: >60 mL/min (ref >60)
GLUCOSE: 91 mg/dL (ref 65–99)
POTASSIUM: 3.8 mmol/L (ref 3.5–5.1)
Sodium: 137 mmol/L (ref 135–145)
TOTAL PROTEIN: 7.4 g/dL (ref 6.5–8.1)

## 2014-08-06 LAB — URINALYSIS, ROUTINE W REFLEX MICROSCOPIC
Bilirubin Urine: NEGATIVE
Bilirubin Urine: NEGATIVE
Glucose, UA: NEGATIVE mg/dL
Glucose, UA: NEGATIVE mg/dL
HGB URINE DIPSTICK: NEGATIVE
Hgb urine dipstick: NEGATIVE
KETONES UR: NEGATIVE mg/dL
Ketones, ur: NEGATIVE mg/dL
Leukocytes, UA: NEGATIVE
NITRITE: NEGATIVE
Nitrite: NEGATIVE
PH: 7 (ref 5.0–8.0)
PROTEIN: NEGATIVE mg/dL
Protein, ur: NEGATIVE mg/dL
Specific Gravity, Urine: 1.01 (ref 1.005–1.030)
Specific Gravity, Urine: 1.012 (ref 1.005–1.030)
UROBILINOGEN UA: 0.2 mg/dL (ref 0.0–1.0)
Urobilinogen, UA: 0.2 mg/dL (ref 0.0–1.0)
pH: 7 (ref 5.0–8.0)

## 2014-08-06 LAB — URINE MICROSCOPIC-ADD ON

## 2014-08-06 LAB — CBC
HCT: 34.4 % — ABNORMAL LOW (ref 36.0–46.0)
HEMOGLOBIN: 11.1 g/dL — AB (ref 12.0–15.0)
MCH: 29.9 pg (ref 26.0–34.0)
MCHC: 32.3 g/dL (ref 30.0–36.0)
MCV: 92.7 fL (ref 78.0–100.0)
Platelets: 328 10*3/uL (ref 150–400)
RBC: 3.71 MIL/uL — AB (ref 3.87–5.11)
RDW: 13 % (ref 11.5–15.5)
WBC: 8.2 10*3/uL (ref 4.0–10.5)

## 2014-08-06 LAB — CBG MONITORING, ED: Glucose-Capillary: 108 mg/dL — ABNORMAL HIGH (ref 65–99)

## 2014-08-06 MED ORDER — ACYCLOVIR 400 MG PO TABS
800.0000 mg | ORAL_TABLET | Freq: Once | ORAL | Status: AC
  Administered 2014-08-06: 800 mg via ORAL
  Filled 2014-08-06: qty 2

## 2014-08-06 MED ORDER — ACYCLOVIR 400 MG PO TABS: 800.0000 mg | ORAL_TABLET | Freq: Every day | ORAL | Status: DC

## 2014-08-06 NOTE — MAU Provider Note (Signed)
History     CSN: [id number]  Arrival date and time: 08/06/14 1328   First Provider Initiated Contact with Patient 08/06/14 1432      Chief Complaint  Patient presents with  . Foot Pain   HPI   Ms. Kayla Flynn is a 24 y.o. female G1P0 who presents with bilateral foot tingling. At 8pm last night she was walking around her house and she noticed tingling in both feet; on the bottoms of both feet. It felt like she was walking on pins and needs. The tingling is not on her heals, only on the balls of her feet. She has also noted 3-4 red marks on her feet that are similar to the ones on her face. She feels that the symptoms have remained the same in intensity since yesterday, however she is worried about the rash on her face.   She came in yesterday because she thought she was having an allergic reaction to potato chips. She has been taking benadryl and it has only helped some. She received IM benadryl yesterday while in MAU. She continues to have an itchy rash on her entire face that is painful at times and has kept her up throughout the night.   OB History    Gravida Para Term Preterm AB TAB SAB Ectopic Multiple Living   1               Past Medical History  Diagnosis Date  . Hypertension   . Obesity   . Prediabetes     "prediabetic; controlled w/diet" (07/17/2012)  . Acute appendicitis 07/18/2012  . Obesity, morbid, BMI 50 or higher 07/18/2012  . GDM (gestational diabetes mellitus)     Past Surgical History  Procedure Laterality Date  . Appendectomy  07/17/2012  . Laparoscopic appendectomy N/A 07/17/2012    Procedure: APPENDECTOMY LAPAROSCOPIC;  Surgeon: Liz Malady, MD;  Location: [facility] OR;  Service: General;  Laterality: N/A;    Family History  Problem Relation Age of Onset  . Hypertension Mother   . Diabetes Mother   . Hypertension Maternal Grandmother     History  Substance Use Topics  . Smoking status: Current Every Day Smoker -- 0.10 packs/day for 1.2 years   Types: Cigarettes  . Smokeless tobacco: Never Used  . Alcohol Use: Yes     Comment: last time couple wks ago but not since finding out was pregnant    Allergies: No Known Allergies  Prescriptions prior to admission  Medication Sig Dispense Refill Last Dose  . diphenhydrAMINE (BENADRYL) 25 mg capsule Take 25 mg by mouth every 4 (four) hours as needed for itching or allergies.   08/06/2014 at Unknown time  . Prenatal Vit-Fe Fumarate-FA (PRENATAL COMPLETE) 14-0.4 MG TABS Take 1 tablet by mouth daily. 30 each 6 08/06/2014 at Unknown time  . ACCU-CHEK FASTCLIX LANCETS MISC Inject 1 each into the skin 4 (four) times daily. New DX GDM for testing 4 times daily DX O24.419 102 each 12 Not Taking  . acetaminophen (TYLENOL) 500 MG tablet Take 1,000 mg by mouth every 6 (six) hours as needed for mild pain.   08/04/2014  . aspirin EC 81 MG tablet Take 1 tablet (81 mg total) by mouth daily. (Patient not taking: Reported on 08/05/2014) 30 tablet 11   . glucose blood (ACCU-CHEK SMARTVIEW) test strip New DX GDM for testing 4 times daily DX O24.419 100 each 12 Not Taking   Results for orders placed or performed during the hospital encounter of  08/06/14 (from the past 48 hour(s))  Urinalysis, Routine w reflex microscopic (not [facility])     Status: None   Collection Time: 08/06/14  1:37 PM  Result Value Ref Range   Color, Urine YELLOW YELLOW   APPearance CLEAR CLEAR   Specific Gravity, Urine 1.010 1.005 - 1.030   pH 7.0 5.0 - 8.0   Glucose, UA NEGATIVE NEGATIVE mg/dL   Hgb urine dipstick NEGATIVE NEGATIVE   Bilirubin Urine NEGATIVE NEGATIVE   Ketones, ur NEGATIVE NEGATIVE mg/dL   Protein, ur NEGATIVE NEGATIVE mg/dL   Urobilinogen, UA 0.2 0.0 - 1.0 mg/dL   Nitrite NEGATIVE NEGATIVE   Leukocytes, UA NEGATIVE NEGATIVE    Comment: MICROSCOPIC NOT DONE ON URINES WITH NEGATIVE PROTEIN, BLOOD, LEUKOCYTES, NITRITE, OR GLUCOSE <1000 mg/dL.    Review of Systems  Musculoskeletal: Positive for joint pain (Bilateral  foot pain. ).  Skin: Positive for itching and rash.  Neurological: Negative for sensory change and focal weakness.   Physical Exam   Blood pressure 130/65, pulse 97, temperature 98.5 F (36.9 C), temperature source Oral, resp. rate 18, last menstrual period 01/10/2014.  Physical Exam  Constitutional: She is oriented to person, place, and time. She appears well-developed.  Non-toxic appearance. She does not have a sickly appearance. She does not appear ill. No distress.  Respiratory: Effort normal.  GI: Soft.  Musculoskeletal: Normal range of motion.       Right foot: Normal. There is normal range of motion, no tenderness and no swelling.       Left foot: Normal. There is normal range of motion, no tenderness and no swelling.  Neurological: She is alert and oriented to person, place, and time. She has normal strength. No sensory deficit. Coordination and gait normal. GCS eye subscore is 4. GCS verbal subscore is 5. GCS motor subscore is 6.  Skin: Skin is warm, dry and intact. Rash (multiple crusted, with some pustule lesions scattered on face. bilateral . ) noted. She is not diaphoretic. There is erythema. No cyanosis.  minminal number of pustule lesions on bilateral lower extremities.     MAU Course  Procedures  none  MDM + fetal heart tones by doppler.  Discussed this patient with Dr. Shawnie PonsPratt who recommends [facility]. The patient is stable, not in any distress and is agreeable to drive her self [facility] urgent [facility]; the patient is undecided on where she will go, however she has agreed to be seen.   **Maternal varicella, non-immune noted on patient's problem list which may be the cause of the patients rash.    Assessment and Plan   A:  1. Tingling in extremities   2. Rash of face     P:  Discharge home in stable condition Patient to go to Urgent care for further evaluation; patient may need steroids and or an evaluation from  dermatology.  Follow up with OB as scheduled   Duane LopeJennifer I Panagiotis Oelkers, NP 08/06/2014 4:39 PM

## 2014-08-06 NOTE — ED Notes (Signed)
EDP,Pheiffer,MD., collected wound culture for pt.

## 2014-08-06 NOTE — MAU Note (Signed)
Pt presents complaining of pain in her feet that makes her feel like she's walking on pins and needles. Was seen in MAU yesterday for a rash on her face and has been taking benedryl over the counter for it. States she has some spots on the sides of her feet now too.

## 2014-08-06 NOTE — ED Notes (Signed)
Attempted lab draw x 3 but unsuccessful.RN, Felipa Etherrence made aware.

## 2014-08-06 NOTE — Discharge Instructions (Signed)
Pregnancy and Chickenpox Chickenpox is caused by a virus called varicella zoster virus (VZV). This virus is very contagious and spread through the air and respiratory system. Usually, a red itchy rash develops that forms blisters and later scabs. The rash and blisters form 10-21 days after being exposed to someone with chickenpox. In pregnant women, chickenpox can cause serious harm to the fetus.  SIGNS AND SYMPTOMS   Flu like symptoms develop first.  Red rash with blisters.  Blisters that turn into scabs. DIAGNOSIS  Your health care provider will do a physical exam and ask about your symptoms. The diagnosis is easily made when the rash and blisters occur. Other tests that may be done to confirm the presence of the virus include:  Blood testing to find the virus antigen in the blood.  Analyzing fluid from the blisters for the virus antigen. TREATMENT   Antiviral medicine is given by mouth, preferably within 24 hours of the appearance of the rash. It lessens the symptoms, the rash, and formation of new blisters. This medicine does not harm the baby.  An antiviral medicine may be given through your vein if you develop pneumonia or encephalitis.  Antiviral medicine does not necessarily prevent the baby from being affected by the virus.  You may be given varicella zoster immune globulin if you developed chickenpox 5 days before delivery. This medicine is given to the newborn 2 days after delivery.  An antiviral will be given to the newborn if he or she develops chickenpox within the first 2 weeks of delivery. If you get chickenpox in the second half of your pregnancy, the baby is not usually affected by the virus. You produce antibodies that cross the placenta into the baby. These antibodies protect the baby. HOME CARE INSTRUCTIONS   Do not scratch the rash or pick the scabs. It can cause scarring.  Take medicine as directed by your health care provider.  Follow up with your health care  provider as directed. SEEK MEDICAL CARE IF:   You suspect that you have been in contact with someone with chicken pox.  You develop signs of chicken pox, such as flu-like symptoms, with a red rash and blisters. SEEK IMMEDIATE MEDICAL CARE IF:   You develop uterine contractions.  You begin leaking fluid from the vagina.  You develop shortness of breath.  You have chickenpox and you develop severe headache.  You have a fever and your symptoms suddenly get worse. Document Released: 12/01/2007 Document Revised: 12/12/2012 Document Reviewed: 10/03/2012 [facility] Patient Information 2015 [place]. This information is not intended to replace advice given to you by your health care provider. Make sure you discuss any questions you have with your health care provider.

## 2014-08-06 NOTE — Discharge Instructions (Signed)
Allergies  Allergies may happen from anything your body is sensitive to. This may be food, medicines, pollens, chemicals, and many other things. Food allergies can be severe and deadly.  HOME CARE  If you do not know what causes a reaction, keep a diary. Write down the foods you ate and the symptoms that followed. Avoid foods that cause reactions.  If you have red raised spots (hives) or a rash:  Take medicine as told by your doctor.  Use medicines for red raised spots and itching as needed.  Apply cold cloths (compresses) to the skin. Take a cool bath. Avoid hot baths or showers.  If you are severely allergic:  It is often necessary to go to the hospital after you have treated your reaction.  Wear your medical alert jewelry.  You and your family must learn how to give a allergy shot or use an allergy kit (anaphylaxis kit).  Always carry your allergy kit or shot with you. Use this medicine as told by your doctor if a severe reaction is occurring. GET HELP RIGHT AWAY IF:  You have trouble breathing or are making high-pitched whistling sounds (wheezing).  You have a tight feeling in your chest or throat.  You have a puffy (swollen) mouth.  You have red raised spots, puffiness (swelling), or itching all over your body.  You have had a severe reaction that was helped by your allergy kit or shot. The reaction can return once the medicine has worn off.  You think you are having a food allergy. Symptoms most often happen within 30 minutes of eating a food.  Your symptoms have not gone away within 2 days or are getting worse.  You have new symptoms.  You want to retest yourself with a food or drink you think causes an allergic reaction. Only do this under the care of a doctor. MAKE SURE YOU:   Understand these instructions.  Will watch your condition.  Will get help right away if you are not doing well or get worse. Document Released: 06/18/2012 Document Reviewed:  06/18/2012 [facility] Patient Information 2015 [place]. This information is not intended to replace advice given to you by your health care provider. Make sure you discuss any questions you have with your health care provider.

## 2014-08-06 NOTE — ED Notes (Addendum)
Pt pregnant, states [facility] for a burning, itching, "tight" pimple-like rash that appeared on her face two days ago, they stated it was allergies. Now it's spreading to the palms of her hands and barely on her feet. States she's also starting to feet some pins-and-needles/tingling sensation in her feet bilaterally that started last night around 8 p.m.

## 2014-08-06 NOTE — ED Provider Notes (Signed)
CSN: [id number]     Arrival date & time 08/06/14  1545 History   First MD Initiated Contact with Patient 08/06/14 1659     Chief Complaint  Patient presents with  . Rash  . Tingling    Feet bilaterally     (Consider location/radiation/quality/duration/timing/severity/associated sxs/prior Treatment) HPI 2 days ago the patient developed a local area that seems like some pimples on her face close to her nose. Since 2 days ago this has spread extensively over her face and she has developed multiple lesions. Some developed pustules and then scab. She reports her face now feels very tight and tingly and itchy. Just as of today she has noted spots developing on the soles of her feet and some on her hands. She reports that tingling burning sensation on the insides of her feet. There are small red dots developing in this area. She reports a few are also developing on her hands. The patient has not had any signs or general illness. She's had no fever, no coryza symptoms, no headache, no myalgia, no nausea or vomiting. She does not think that she had chickenpox as a child. She does believe her immunizations to be up-to-date. The patient works in a daycare setting. Patient poor she has had prenatal care. She reports that she has had her GC chlamydia testing which is negative. She states the last time she was sexually active was prior to this testing. She has no history of prior STD. There has been no abdominal pain or abnormal vaginal discharge. Past Medical History  Diagnosis Date  . Hypertension   . Obesity   . Prediabetes     "prediabetic; controlled w/diet" (07/17/2012)  . Acute appendicitis 07/18/2012  . Obesity, morbid, BMI 50 or higher 07/18/2012  . GDM (gestational diabetes mellitus)    Past Surgical History  Procedure Laterality Date  . Appendectomy  07/17/2012  . Laparoscopic appendectomy N/A 07/17/2012    Procedure: APPENDECTOMY LAPAROSCOPIC;  Surgeon: Liz Malady, MD;  Location: [facility] OR;   Service: General;  Laterality: N/A;   Family History  Problem Relation Age of Onset  . Hypertension Mother   . Diabetes Mother   . Hypertension Maternal Grandmother    History  Substance Use Topics  . Smoking status: Current Every Day Smoker -- 0.10 packs/day for 1.2 years    Types: Cigarettes  . Smokeless tobacco: Never Used  . Alcohol Use: Yes     Comment: last time couple wks ago but not since finding out was pregnant   OB History    Gravida Para Term Preterm AB TAB SAB Ectopic Multiple Living   1              Review of Systems 10 Systems reviewed and are negative for acute change except as noted in the HPI.   Allergies  Review of patient's allergies indicates no known allergies.  Home Medications   Prior to Admission medications   Medication Sig Start Date End Date Taking? Authorizing Provider  ACCU-CHEK FASTCLIX LANCETS MISC Inject 1 each into the skin 4 (four) times daily. New DX GDM for testing 4 times daily DX O24.419 07/14/14  Yes Rhona Raider Stinson, DO  acetaminophen (TYLENOL) 500 MG tablet Take 1,000 mg by mouth every 6 (six) hours as needed for mild pain.   Yes Historical Provider, MD  diphenhydrAMINE (BENADRYL) 25 mg capsule Take 50 mg by mouth every 4 (four) hours as needed for itching or allergies.    Yes Historical Provider,  MD  glucose blood (ACCU-CHEK SMARTVIEW) test strip New DX GDM for testing 4 times daily DX O24.419 07/14/14  Yes Levie Heritage, DO  Prenatal Vit-Fe Fumarate-FA (PRENATAL COMPLETE) 14-0.4 MG TABS Take 1 tablet by mouth daily. 07/28/14  Yes Rhona Raider Stinson, DO  acyclovir (ZOVIRAX) 400 MG tablet Take 2 tablets (800 mg total) by mouth 5 (five) times daily. 08/06/14   Arby Barrette, MD  aspirin EC 81 MG tablet Take 1 tablet (81 mg total) by mouth daily. Patient not taking: Reported on 08/05/2014 07/28/14   Rhona Raider Stinson, DO   BP 124/63 mmHg  Pulse 92  Temp(Src) 98.8 F (37.1 C) (Oral)  Resp 13  Wt 355 lb (161.027 kg)  SpO2 99%  LMP 01/10/2014  (Approximate) Physical Exam  Constitutional: She is oriented to person, place, and time.  Patient is morbidly obese. She is alert and nontoxic. She is in no acute distress.  HENT:  Head: Normocephalic and atraumatic.  Nose: Nose normal.  Mouth/Throat: Oropharynx is clear and moist. No oropharyngeal exudate.  Eyes: Conjunctivae and EOM are normal. Pupils are equal, round, and reactive to light. Right eye exhibits no discharge. Left eye exhibits no discharge. No scleral icterus.  Neck: Neck supple.  Cardiovascular: Normal rate, regular rhythm, normal heart sounds and intact distal pulses.   Pulmonary/Chest: Effort normal and breath sounds normal. No respiratory distress. She has no wheezes. She has no rales.  Abdominal: Soft. Bowel sounds are normal. She exhibits no distension. There is no tenderness.  Patient's abdomen is morbidly obese.  Musculoskeletal: Normal range of motion. She exhibits no edema or tenderness.  Neurological: She is alert and oriented to person, place, and time. No cranial nerve deficit. She exhibits normal muscle tone. Coordination normal.  Skin: Skin is warm and dry. Rash noted.  See photo documentation for rash on the face and subtle rash on palms and soles. The remainder of the body does not have other rash.  Psychiatric: She has a normal mood and affect.                ED Course  Procedures (including critical care time) Labs Review Labs Reviewed  CBC - Abnormal; Notable for the following:    RBC 3.71 (*)    Hemoglobin 11.1 (*)    HCT 34.4 (*)    All other components within normal limits  COMPREHENSIVE METABOLIC PANEL - Abnormal; Notable for the following:    BUN <5 (*)    Creatinine, Ser 0.39 (*)    Albumin 3.4 (*)    Total Bilirubin 0.2 (*)    All other components within normal limits  CBC WITH DIFFERENTIAL/PLATELET - Abnormal; Notable for the following:    RBC 3.56 (*)    Hemoglobin 10.7 (*)    HCT 33.4 (*)    All other components within  normal limits  CBG MONITORING, ED - Abnormal; Notable for the following:    Glucose-Capillary 108 (*)    All other components within normal limits  CULTURE, BLOOD (ROUTINE X 2)  CULTURE, BLOOD (ROUTINE X 2)  WOUND CULTURE  URINALYSIS, ROUTINE W REFLEX MICROSCOPIC (NOT AT [facility])  VARICELLA ZOSTER ANTIBODY, IGG  VARICELLA ZOSTER ANTIBODY, IGM    Imaging Review No results found.   EKG Interpretation None     Consult: The patient's case was reviewed with Dr. Ilsa Iha of infectious disease. She advises to obtain her cell cultures and blood cultures with a CBC and culture of the lesion. She will contact the  patient tomorrow to continue follow-up. The patient will be started on acyclovir. MDM   Final diagnoses:  Varicella-zoster infection   The patient presents well in appearance without any prodromal or constitutional symptoms. Rash is very suggestive of varicella-zoster. The patient thus far has a [redacted] week gestation pregnancy with prenatal care and no apparent complications. Consultation was made with infectious disease and this point time the patient will appear clearly be started on acyclovir with close follow-up and monitoring.    Arby BarretteMarcy Nesiah Jump, MD 08/06/14 2308

## 2014-08-07 ENCOUNTER — Other Ambulatory Visit: Payer: Self-pay | Admitting: Internal Medicine

## 2014-08-07 ENCOUNTER — Ambulatory Visit (HOSPITAL_COMMUNITY)
Admission: RE | Admit: 2014-08-07 | Discharge: 2014-08-07 | Disposition: A | Discharge status: Home / Self Care | Payer: Medicaid Other | Source: Ambulatory Visit | Attending: Family Medicine | Admitting: Family Medicine

## 2014-08-07 DIAGNOSIS — Z6841 Body Mass Index (BMI) 40.0 and over, adult: Secondary | ICD-10-CM | POA: Diagnosis not present

## 2014-08-07 DIAGNOSIS — O24312 Unspecified pre-existing diabetes mellitus in pregnancy, second trimester: Secondary | ICD-10-CM | POA: Diagnosis not present

## 2014-08-07 DIAGNOSIS — O99212 Obesity complicating pregnancy, second trimester: Secondary | ICD-10-CM | POA: Diagnosis not present

## 2014-08-07 DIAGNOSIS — Z3A18 18 weeks gestation of pregnancy: Secondary | ICD-10-CM | POA: Diagnosis not present

## 2014-08-07 DIAGNOSIS — E119 Type 2 diabetes mellitus without complications: Secondary | ICD-10-CM | POA: Insufficient documentation

## 2014-08-07 DIAGNOSIS — O10912 Unspecified pre-existing hypertension complicating pregnancy, second trimester: Secondary | ICD-10-CM | POA: Diagnosis not present

## 2014-08-07 DIAGNOSIS — Z36 Encounter for antenatal screening of mother: Secondary | ICD-10-CM | POA: Diagnosis present

## 2014-08-07 DIAGNOSIS — O0991 Supervision of high risk pregnancy, unspecified, first trimester: Secondary | ICD-10-CM

## 2014-08-07 DIAGNOSIS — Z3689 Encounter for other specified antenatal screening: Secondary | ICD-10-CM | POA: Insufficient documentation

## 2014-08-07 NOTE — Progress Notes (Signed)
Called patient. She is doing ok. Still having itching of rash. She is started to notice more vesicular rash on torso. Near breast. She has not picked up acyclovir as of yet. Recommended to pick up her rx.  Will touch base with her tomorrow. Gave her our number to call in case it worsens. Also, if she goes to [facility] or other ED, to let them know she is being evaluated to chicken pox. She lives with 2 adults. Mom had shingles last year.

## 2014-08-08 LAB — VARICELLA ZOSTER ANTIBODY, IGG: Varicella IgG: <135 index — ABNORMAL LOW (ref >165)

## 2014-08-08 LAB — VARICELLA ZOSTER ANTIBODY, IGM: VARICELLA-ZOSTER AB, IGM: 0.17 {ISR} (ref <0.91)

## 2014-08-09 LAB — WOUND CULTURE
Culture: NO GROWTH
GRAM STAIN: NONE SEEN
Special Requests: NORMAL

## 2014-08-11 ENCOUNTER — Ambulatory Visit: Payer: Self-pay

## 2014-08-11 ENCOUNTER — Other Ambulatory Visit: Payer: Self-pay | Admitting: Family

## 2014-08-11 ENCOUNTER — Encounter: Payer: Self-pay | Admitting: Family

## 2014-08-11 ENCOUNTER — Encounter: Payer: Self-pay | Admitting: Obstetrics and Gynecology

## 2014-08-11 DIAGNOSIS — Z8619 Personal history of other infectious and parasitic diseases: Secondary | ICD-10-CM | POA: Insufficient documentation

## 2014-08-13 LAB — CULTURE, BLOOD (ROUTINE X 2)
Culture: NO GROWTH
Culture: NO GROWTH

## 2014-08-18 ENCOUNTER — Ambulatory Visit (INDEPENDENT_AMBULATORY_CARE_PROVIDER_SITE_OTHER): Payer: Medicaid Other | Admitting: Obstetrics and Gynecology

## 2014-08-18 ENCOUNTER — Encounter: Payer: Self-pay | Admitting: Obstetrics and Gynecology

## 2014-08-18 VITALS — BP 124/61 | HR 102 | Temp 98.7°F | Wt 355.0 lb

## 2014-08-18 DIAGNOSIS — R21 Rash and other nonspecific skin eruption: Secondary | ICD-10-CM | POA: Diagnosis not present

## 2014-08-18 DIAGNOSIS — O10912 Unspecified pre-existing hypertension complicating pregnancy, second trimester: Secondary | ICD-10-CM | POA: Diagnosis not present

## 2014-08-18 DIAGNOSIS — O99322 Drug use complicating pregnancy, second trimester: Secondary | ICD-10-CM | POA: Diagnosis not present

## 2014-08-18 DIAGNOSIS — F1910 Other psychoactive substance abuse, uncomplicated: Secondary | ICD-10-CM | POA: Diagnosis not present

## 2014-08-18 DIAGNOSIS — O9912 Other diseases of the blood and blood-forming organs and certain disorders involving the immune mechanism complicating childbirth: Secondary | ICD-10-CM

## 2014-08-18 DIAGNOSIS — O24111 Pre-existing diabetes mellitus, type 2, in pregnancy, first trimester: Secondary | ICD-10-CM | POA: Diagnosis not present

## 2014-08-18 DIAGNOSIS — O0992 Supervision of high risk pregnancy, unspecified, second trimester: Secondary | ICD-10-CM | POA: Diagnosis present

## 2014-08-18 MED ORDER — METFORMIN HCL 500 MG PO TABS: 500.0000 mg | ORAL_TABLET | Freq: Two times a day (BID) | ORAL | Status: DC

## 2014-08-18 NOTE — Progress Notes (Signed)
Subjective:  Kayla Flynn is a 24 y.o. G1P0 at [redacted]w[redacted]d being seen today for ongoing prenatal care.  Patient reports no complaints.  Contractions: Not present.  Vag. Bleeding: None. Movement: Present. Denies leaking of fluid.   The following portions of the patient's history were reviewed and updated as appropriate: allergies, current medications, past family history, past medical history, past social history, past surgical history and problem list.   Objective:   Filed Vitals:   08/18/14 1119  BP: 124/61  Pulse: 102  Temp: 98.7 F (37.1 C)  Weight: 355 lb (161.027 kg)    Fetal Status: Fetal Heart Rate (bpm): 163   Movement: Present     General:  Alert, oriented and cooperative. Patient is in no acute distress.  Skin: Skin is warm and dry. No rash noted.   Cardiovascular: Normal heart rate noted  Respiratory: Effort and breath sounds normal, no problems with respiration noted  Abdomen: Soft, gravid, appropriate for gestational age. Pain/Pressure: Absent     Vaginal: Vag. Bleeding: None.       Cervix: Not evaluated  Extremities: Normal range of motion.  Edema: None  Mental Status: Normal mood and affect. Normal behavior. Normal judgment and thought content.   Urinalysis:      Assessment and Plan:  Pregnancy: G1P0 at [redacted]w[redacted]d.  1. Pre-existing type 2 diabetes mellitus during pregnancy in first trimester Reviewed blood glucose log and mostly above goal.  Current medications: None. Start metformin 500 mg twice daily. Will likely need uptitration and start of glyburide.  Continue diet/exercise. Patient to stop drinking koolaid. Plans to start walking 30 minutes daily with her mom.  Continue baby aspirin daily.   2. 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, antepartum Initial UDS +MJ  3. Chronic hypertension in obstetric context in second trimester Blood pressure at goal today.  No medications.  Continue baby aspirin daily.   4. Supervision of high-risk pregnancy,  second trimester Dx: pre-existing diabetes, chronic hypertension, obesity Anatomy scan completed. Will need repeat scan at 24 week to complete fetal survey.   5. Obesity, morbid, BMI 50 or higher  6. Rash Recently treated for varicella infection in pregnancy. Has a history of syphilis and will obtain RPR today. - RPR   Preterm labor symptoms and general obstetric precautions including but not limited to vaginal bleeding, contractions, leaking of fluid and fetal movement were reviewed in detail with the patient.  Please refer to After Visit Summary for other counseling recommendations.   Return in about 2 weeks (around 09/01/2014).   William Dalton, MD

## 2014-08-18 NOTE — Progress Notes (Signed)
Pt is not currently taking aspirin, acyclovir Breastfeeding tip of the week reviewed

## 2014-08-19 LAB — RPR

## 2014-09-01 ENCOUNTER — Encounter: Payer: Medicaid Other | Admitting: Family Medicine

## 2014-10-13 ENCOUNTER — Inpatient Hospital Stay (HOSPITAL_COMMUNITY)
Admission: AD | Admit: 2014-10-13 | Discharge: 2014-10-13 | Disposition: A | Discharge status: Home / Self Care | Payer: Medicaid Other | Source: Ambulatory Visit | Attending: Family Medicine | Admitting: Family Medicine

## 2014-10-13 ENCOUNTER — Encounter (HOSPITAL_COMMUNITY): Payer: Self-pay | Admitting: *Deleted

## 2014-10-13 DIAGNOSIS — O99332 Smoking (tobacco) complicating pregnancy, second trimester: Secondary | ICD-10-CM | POA: Insufficient documentation

## 2014-10-13 DIAGNOSIS — Z6841 Body Mass Index (BMI) 40.0 and over, adult: Secondary | ICD-10-CM | POA: Diagnosis not present

## 2014-10-13 DIAGNOSIS — F17210 Nicotine dependence, cigarettes, uncomplicated: Secondary | ICD-10-CM | POA: Insufficient documentation

## 2014-10-13 DIAGNOSIS — R51 Headache: Secondary | ICD-10-CM | POA: Diagnosis present

## 2014-10-13 DIAGNOSIS — E669 Obesity, unspecified: Secondary | ICD-10-CM | POA: Diagnosis not present

## 2014-10-13 DIAGNOSIS — O10912 Unspecified pre-existing hypertension complicating pregnancy, second trimester: Secondary | ICD-10-CM | POA: Diagnosis not present

## 2014-10-13 DIAGNOSIS — O24312 Unspecified pre-existing diabetes mellitus in pregnancy, second trimester: Secondary | ICD-10-CM

## 2014-10-13 DIAGNOSIS — Z3A27 27 weeks gestation of pregnancy: Secondary | ICD-10-CM | POA: Insufficient documentation

## 2014-10-13 DIAGNOSIS — G44209 Tension-type headache, unspecified, not intractable: Secondary | ICD-10-CM | POA: Insufficient documentation

## 2014-10-13 DIAGNOSIS — O24111 Pre-existing diabetes mellitus, type 2, in pregnancy, first trimester: Secondary | ICD-10-CM

## 2014-10-13 DIAGNOSIS — A539 Syphilis, unspecified: Secondary | ICD-10-CM

## 2014-10-13 DIAGNOSIS — R030 Elevated blood-pressure reading, without diagnosis of hypertension: Secondary | ICD-10-CM

## 2014-10-13 DIAGNOSIS — O99212 Obesity complicating pregnancy, second trimester: Secondary | ICD-10-CM | POA: Insufficient documentation

## 2014-10-13 DIAGNOSIS — O9989 Other specified diseases and conditions complicating pregnancy, childbirth and the puerperium: Secondary | ICD-10-CM | POA: Diagnosis not present

## 2014-10-13 LAB — COMPREHENSIVE METABOLIC PANEL
ALT: 10 U/L — ABNORMAL LOW (ref 14–54)
ANION GAP: 8 (ref 5–15)
AST: 13 U/L — AB (ref 15–41)
Albumin: 2.9 g/dL — ABNORMAL LOW (ref 3.5–5.0)
Alkaline Phosphatase: 75 U/L (ref 38–126)
BILIRUBIN TOTAL: 0.4 mg/dL (ref 0.3–1.2)
BUN: 7 mg/dL (ref 6–20)
CHLORIDE: 105 mmol/L (ref 101–111)
CO2: 25 mmol/L (ref 22–32)
CREATININE: 0.52 mg/dL (ref 0.44–1.00)
Calcium: 9 mg/dL (ref 8.9–10.3)
Glucose, Bld: 98 mg/dL (ref 65–99)
POTASSIUM: 3.8 mmol/L (ref 3.5–5.1)
Sodium: 138 mmol/L (ref 135–145)
Total Protein: 6.8 g/dL (ref 6.5–8.1)

## 2014-10-13 LAB — CBC WITH DIFFERENTIAL/PLATELET
BASOS ABS: 0 10*3/uL (ref 0.0–0.1)
Basophils Relative: 0 % (ref 0–1)
Eosinophils Absolute: 0.1 10*3/uL (ref 0.0–0.7)
Eosinophils Relative: 1 % (ref 0–5)
HEMATOCRIT: 32.2 % — AB (ref 36.0–46.0)
HEMOGLOBIN: 10.4 g/dL — AB (ref 12.0–15.0)
Lymphocytes Relative: 17 % (ref 12–46)
Lymphs Abs: 1.9 10*3/uL (ref 0.7–4.0)
MCH: 30.5 pg (ref 26.0–34.0)
MCHC: 32.3 g/dL (ref 30.0–36.0)
MCV: 94.4 fL (ref 78.0–100.0)
MONO ABS: 0.9 10*3/uL (ref 0.1–1.0)
MONOS PCT: 8 % (ref 3–12)
NEUTROS ABS: 8.6 10*3/uL — AB (ref 1.7–7.7)
Neutrophils Relative %: 74 % (ref 43–77)
Platelets: 321 10*3/uL (ref 150–400)
RBC: 3.41 MIL/uL — ABNORMAL LOW (ref 3.87–5.11)
RDW: 13.8 % (ref 11.5–15.5)
WBC: 11.6 10*3/uL — AB (ref 4.0–10.5)

## 2014-10-13 LAB — PROTEIN / CREATININE RATIO, URINE
Creatinine, Urine: 115 mg/dL
PROTEIN CREATININE RATIO: 0.07 mg/mg{creat} (ref 0.00–0.15)
TOTAL PROTEIN, URINE: 8 mg/dL

## 2014-10-13 MED ORDER — LABETALOL HCL 5 MG/ML IV SOLN
20.0000 mg | INTRAVENOUS | Status: DC | PRN
Start: 2014-10-13 — End: 2014-10-13

## 2014-10-13 MED ORDER — BUTALBITAL-APAP-CAFFEINE 50-325-40 MG PO TABS
1.0000 | ORAL_TABLET | Freq: Once | ORAL | Status: AC
  Administered 2014-10-13: 1 via ORAL
  Filled 2014-10-13: qty 1

## 2014-10-13 MED ORDER — HYDRALAZINE HCL 20 MG/ML IJ SOLN: 10.0000 mg | Freq: Once | INTRAMUSCULAR | Status: DC | PRN

## 2014-10-13 MED ORDER — BUTALBITAL-APAP-CAFFEINE 50-325-40 MG PO CAPS: 1.0000 | ORAL_CAPSULE | Freq: Four times a day (QID) | ORAL | Status: DC | PRN

## 2014-10-13 NOTE — MAU Provider Note (Signed)
History     CSN: [id number]  Arrival date and time: 10/13/14 1517   None    No chief complaint on file.  HPI Patient is 24 y.o. G1P0 [redacted]w[redacted]d here with complaints of HA and swelling.   HA: 1 week ago, on and off. Took Tylenol which has helped take the edge. Worse 9/10 and after tylenol it is 4-5/10. Never really goes away.  Denies vision changes, seeing spots or flashing lights.  Had 1 HA early in pregnancy very similar to this HA and was prescribed (Fioricet) which helped stop her HA.  Reports she drinks about 4x 32 oz drinks daily of ice water.   Swelling: 2-3 days ago she noticed swelling feet. Today it is worsening and now is pitting. Elevation did not work.   cHTN: used to on HCTZ, low dose. Stopped at age 23.    +FM, denies LOF, VB, contractions, vaginal discharge.  Reports round ligament pain.   OB History    Gravida Para Term Preterm AB TAB SAB Ectopic Multiple Living   1               Past Medical History  Diagnosis Date  . Hypertension   . Obesity   . Prediabetes     "prediabetic; controlled w/diet" (07/17/2012)  . Acute appendicitis 07/18/2012  . Obesity, morbid, BMI 50 or higher 07/18/2012  . GDM (gestational diabetes mellitus)     Past Surgical History  Procedure Laterality Date  . Appendectomy  07/17/2012  . Laparoscopic appendectomy N/A 07/17/2012    Procedure: APPENDECTOMY LAPAROSCOPIC;  Surgeon: Liz Malady, MD;  Location: [facility] OR;  Service: General;  Laterality: N/A;    Family History  Problem Relation Age of Onset  . Hypertension Mother   . Diabetes Mother   . Hypertension Maternal Grandmother     History  Substance Use Topics  . Smoking status: Current Every Day Smoker -- 0.10 packs/day for 1.2 years    Types: Cigarettes  . Smokeless tobacco: Never Used  . Alcohol Use: Yes     Comment: last time couple wks ago but not since finding out was pregnant    Allergies: No Known Allergies  Prescriptions prior to admission  Medication Sig Dispense  Refill Last Dose  . ACCU-CHEK FASTCLIX LANCETS MISC Inject 1 each into the skin 4 (four) times daily. New DX GDM for testing 4 times daily DX O24.419 102 each 12 Taking  . acetaminophen (TYLENOL) 500 MG tablet Take 1,000 mg by mouth every 6 (six) hours as needed for mild pain.   Past Week at Unknown time  . aspirin EC 81 MG tablet Take 1 tablet (81 mg total) by mouth daily. 30 tablet 11 10/13/2014 at 1400  . glucose blood (ACCU-CHEK SMARTVIEW) test strip New DX GDM for testing 4 times daily DX O24.419 100 each 12 Taking  . metFORMIN (GLUCOPHAGE) 500 MG tablet Take 1 tablet (500 mg total) by mouth 2 (two) times daily with a meal. 60 tablet 3 10/13/2014 at 0800  . Prenatal Vit-Fe Fumarate-FA (PRENATAL COMPLETE) 14-0.4 MG TABS Take 1 tablet by mouth daily. 30 each 6 10/13/2014 at 1000  . acyclovir (ZOVIRAX) 400 MG tablet Take 2 tablets (800 mg total) by mouth 5 (five) times daily. (Patient not taking: Reported on 08/18/2014) 80 tablet 0 Completed Course at Unknown time    Review of Systems  Constitutional: Negative for fever and chills.  Eyes: Negative for blurred vision and double vision.  Respiratory: Negative for cough and  shortness of breath.   Cardiovascular: Negative for chest pain and orthopnea.  Gastrointestinal: Negative for nausea and vomiting.  Genitourinary: Negative for dysuria, frequency and flank pain.  Musculoskeletal: Negative for myalgias.  Skin: Negative for rash.  Neurological: Negative for dizziness, tingling, weakness and headaches.  Endo/Heme/Allergies: Does not bruise/bleed easily.  Psychiatric/Behavioral: Negative for depression and suicidal ideas. The patient is not nervous/anxious.    Physical Exam   Blood pressure 145/77, pulse 99, temperature 98.4 F (36.9 C), temperature source Oral, resp. rate 18, height 4' 9.25" (1.454 m), weight 365 lb 8 oz (165.79 kg), last menstrual period 01/10/2014.  Physical Exam  Constitutional: She is oriented to person, place, and time. She  appears well-developed and well-nourished. No distress.  Pregnant female. Morbidly obese  HENT:  Head: Normocephalic and atraumatic.  Eyes: Conjunctivae are normal. No scleral icterus.  Neck: Normal range of motion. Neck supple.  Cardiovascular: Normal rate and intact distal pulses.   Respiratory: Effort normal. She exhibits no tenderness.  GI: Soft. There is no tenderness. There is no rebound and no guarding.  Gravid  Genitourinary: Vagina normal.  Musculoskeletal: Normal range of motion. She exhibits edema (+1 pitting bilatera feet. ).  Neurological: She is alert and oriented to person, place, and time.  Skin: Skin is warm and dry. No rash noted.  Psychiatric: She has a normal mood and affect.    MAU Course  Procedures MDM NST -difficult to trace 2/2 to habitus.  155/mod/+accels (10X10)/ no decels.  CMP, CBC, UP/C  Assessment and Plan  Patient is 24 y.o. G1P0 [redacted]w[redacted]d here with complaints of HA and swelling.   #Concern for superimposed PreX: previous work up in June was negative. Has baseline HTN (no meds).  - Reviewed labs and they were wnl  - Reviewed Preeclampsia warning signs - f/u at scheduled OB visit  #Headache: seem tension style.  Gave fioricet x 1 tab in MAU and will sent home with Rx.   Isa Rankin [facility] 10/13/2014, 5:06 PM

## 2014-10-13 NOTE — MAU Note (Signed)
Urine in lab 

## 2014-10-13 NOTE — MAU Note (Signed)
Pt states since yesterday feet have been swollen more than usual. Also has been having headaches as well. Denies bleeding or abnormal vaginal d/c.

## 2014-10-27 ENCOUNTER — Ambulatory Visit (INDEPENDENT_AMBULATORY_CARE_PROVIDER_SITE_OTHER): Payer: Medicaid Other | Admitting: Obstetrics and Gynecology

## 2014-10-27 VITALS — BP 135/46 | HR 114 | Temp 98.4°F | Wt 364.8 lb

## 2014-10-27 DIAGNOSIS — O24113 Pre-existing diabetes mellitus, type 2, in pregnancy, third trimester: Secondary | ICD-10-CM | POA: Diagnosis present

## 2014-10-27 DIAGNOSIS — O0990 Supervision of high risk pregnancy, unspecified, unspecified trimester: Secondary | ICD-10-CM

## 2014-10-27 DIAGNOSIS — O0993 Supervision of high risk pregnancy, unspecified, third trimester: Secondary | ICD-10-CM

## 2014-10-27 DIAGNOSIS — Z23 Encounter for immunization: Secondary | ICD-10-CM

## 2014-10-27 DIAGNOSIS — O24111 Pre-existing diabetes mellitus, type 2, in pregnancy, first trimester: Secondary | ICD-10-CM

## 2014-10-27 DIAGNOSIS — O24319 Unspecified pre-existing diabetes mellitus in pregnancy, unspecified trimester: Secondary | ICD-10-CM

## 2014-10-27 DIAGNOSIS — E119 Type 2 diabetes mellitus without complications: Secondary | ICD-10-CM | POA: Diagnosis not present

## 2014-10-27 DIAGNOSIS — Z3493 Encounter for supervision of normal pregnancy, unspecified, third trimester: Secondary | ICD-10-CM

## 2014-10-27 LAB — POCT URINALYSIS DIP (DEVICE)
Bilirubin Urine: NEGATIVE
Glucose, UA: NEGATIVE mg/dL
Hgb urine dipstick: NEGATIVE
KETONES UR: NEGATIVE mg/dL
Leukocytes, UA: NEGATIVE
Nitrite: NEGATIVE
PH: 7 (ref 5.0–8.0)
PROTEIN: NEGATIVE mg/dL
SPECIFIC GRAVITY, URINE: 1.02 (ref 1.005–1.030)
Urobilinogen, UA: 0.2 mg/dL (ref 0.0–1.0)

## 2014-10-27 LAB — CBC
HCT: 33 % — ABNORMAL LOW (ref 36.0–46.0)
Hemoglobin: 10.9 g/dL — ABNORMAL LOW (ref 12.0–15.0)
MCH: 30 pg (ref 26.0–34.0)
MCHC: 33 g/dL (ref 30.0–36.0)
MCV: 90.9 fL (ref 78.0–100.0)
MPV: 9.1 fL (ref 8.6–12.4)
PLATELETS: 350 10*3/uL (ref 150–400)
RBC: 3.63 MIL/uL — ABNORMAL LOW (ref 3.87–5.11)
RDW: 13.7 % (ref 11.5–15.5)
WBC: 10.2 10*3/uL (ref 4.0–10.5)

## 2014-10-27 LAB — HEMOGLOBIN A1C
Hgb A1c MFr Bld: 5.5 % (ref <5.7)
Mean Plasma Glucose: 111 mg/dL (ref <117)

## 2014-10-27 MED ORDER — TETANUS-DIPHTH-ACELL PERTUSSIS 5-2.5-18.5 LF-MCG/0.5 IM SUSP
0.5000 mL | Freq: Once | INTRAMUSCULAR | Status: AC
  Administered 2014-10-27: 0.5 mL via INTRAMUSCULAR

## 2014-10-27 NOTE — Progress Notes (Signed)
Ophto appointment with Dr Karleen Hampshire 12/01/2014 :30AM Ultrasound scheduled for 11/12/2014 :00PM

## 2014-10-27 NOTE — Progress Notes (Signed)
Subjective:  Jontae Sonier is a 24 y.o. G1P0 at [redacted]w[redacted]d being seen today for ongoing prenatal care.  Patient reports no complaints. Doesn't bring log. Says one fasting > 95 this past week. Says post-prandials few are greater than 120.   Contractions: Not present.  Vag. Bleeding: None. Movement: Present. Denies leaking of fluid.   The following portions of the patient's history were reviewed and updated as appropriate: allergies, current medications, past family history, past medical history, past social history, past surgical history and problem list.   Objective:   Filed Vitals:   10/27/14 1057  BP: 135/46  Pulse: 114  Temp: 98.4 F (36.9 C)  Weight: 364 lb 12.8 oz (165.472 kg)    Fetal Status: Fetal Heart Rate (bpm): 154 Fundal Height: 50 cm Movement: Present     General:  Alert, oriented and cooperative. Patient is in no acute distress.  Skin: Skin is warm and dry. No rash noted.   Cardiovascular: Normal heart rate noted  Respiratory: Normal respiratory effort, no problems with respiration noted  Abdomen: Soft, gravid, appropriate for gestational age. Pain/Pressure: Absent     Pelvic: Vag. Bleeding: None     Cervical exam deferred        Extremities: Normal range of motion.  Edema: Mild pitting, slight indentation  Mental Status: Normal mood and affect. Normal behavior. Normal judgment and thought content.   Urinalysis:      Assessment and Plan:  Pregnancy: G1P0 at [redacted]w[redacted]d  1. Need for Tdap vaccination - Tdap (BOOSTRIX) injection 0.5 mL; Inject 0.5 mLs into the muscle once.  2. Flu vaccine need - Flu Vaccine QUAD 36+ mos IM; Standing - Flu Vaccine QUAD 36+ mos IM  3. Prenatal care in third trimester - CBC - RPR - HIV antibody (with reflex)  4. Pre-gestational dm - concerned about lack of care and lack of log, but reports of sugars are appropriate - f/u one week DM educator - growth scan ordered - ROI for [facility] fetal echo which patient says was normal - ophtho referal -  circ information given - a1c today to estimate control  # cHTN - bp appropriate not on meds  Preterm labor symptoms and general obstetric precautions including but not limited to vaginal bleeding, contractions, leaking of fluid and fetal movement were reviewed in detail with the patient. Please refer to After Visit Summary for other counseling recommendations.  Return in about 1 week (around 11/03/2014).    Kathrynn Running, MD

## 2014-10-27 NOTE — Progress Notes (Signed)
Breastfeeding tip of the week reviewed Flu vaccine/Tdap vaccine 28 wk labs Pt has questions concerning swelling of feet

## 2014-10-28 ENCOUNTER — Encounter: Payer: Self-pay | Admitting: Obstetrics & Gynecology

## 2014-10-28 LAB — RPR

## 2014-10-28 LAB — HIV ANTIBODY (ROUTINE TESTING W REFLEX): HIV 1&2 Ab, 4th Generation: NONREACTIVE

## 2014-11-03 ENCOUNTER — Encounter: Payer: Medicaid Other | Admitting: Obstetrics and Gynecology

## 2014-11-12 ENCOUNTER — Ambulatory Visit (HOSPITAL_COMMUNITY)
Admission: RE | Admit: 2014-11-12 | Discharge: 2014-11-12 | Disposition: A | Discharge status: Home / Self Care | Payer: Medicaid Other | Source: Ambulatory Visit | Attending: Obstetrics and Gynecology | Admitting: Obstetrics and Gynecology

## 2014-11-12 ENCOUNTER — Other Ambulatory Visit (HOSPITAL_COMMUNITY): Payer: Self-pay | Admitting: Obstetrics and Gynecology

## 2014-11-12 VITALS — BP 140/82 | HR 105 | Wt 368.8 lb

## 2014-11-12 DIAGNOSIS — Z6841 Body Mass Index (BMI) 40.0 and over, adult: Secondary | ICD-10-CM | POA: Insufficient documentation

## 2014-11-12 DIAGNOSIS — O99210 Obesity complicating pregnancy, unspecified trimester: Secondary | ICD-10-CM | POA: Diagnosis not present

## 2014-11-12 DIAGNOSIS — O10913 Unspecified pre-existing hypertension complicating pregnancy, third trimester: Secondary | ICD-10-CM

## 2014-11-12 DIAGNOSIS — Z3A00 Weeks of gestation of pregnancy not specified: Secondary | ICD-10-CM | POA: Diagnosis not present

## 2014-11-12 DIAGNOSIS — O24319 Unspecified pre-existing diabetes mellitus in pregnancy, unspecified trimester: Secondary | ICD-10-CM

## 2014-11-19 ENCOUNTER — Ambulatory Visit (HOSPITAL_COMMUNITY): Admission: RE | Admit: 2014-11-19 | Payer: Medicaid Other | Source: Ambulatory Visit

## 2014-11-24 ENCOUNTER — Other Ambulatory Visit (HOSPITAL_COMMUNITY): Payer: Self-pay | Admitting: Obstetrics and Gynecology

## 2014-11-24 DIAGNOSIS — O10913 Unspecified pre-existing hypertension complicating pregnancy, third trimester: Secondary | ICD-10-CM

## 2014-11-26 ENCOUNTER — Inpatient Hospital Stay (HOSPITAL_COMMUNITY)
Admission: AD | Admit: 2014-11-26 | Discharge: 2014-11-26 | Disposition: A | Discharge status: Home / Self Care | Payer: Medicaid Other | Source: Ambulatory Visit | Attending: Family Medicine | Admitting: Family Medicine

## 2014-11-26 ENCOUNTER — Inpatient Hospital Stay (HOSPITAL_COMMUNITY): Admission: RE | Admit: 2014-11-26 | Payer: Medicaid Other | Source: Ambulatory Visit

## 2014-11-26 ENCOUNTER — Ambulatory Visit (HOSPITAL_COMMUNITY)
Admission: RE | Admit: 2014-11-26 | Discharge: 2014-11-26 | Disposition: A | Discharge status: Home / Self Care | Payer: Medicaid Other | Source: Ambulatory Visit | Attending: Obstetrics and Gynecology | Admitting: Obstetrics and Gynecology

## 2014-11-26 ENCOUNTER — Other Ambulatory Visit (HOSPITAL_COMMUNITY): Payer: Self-pay | Admitting: Obstetrics and Gynecology

## 2014-11-26 DIAGNOSIS — O26893 Other specified pregnancy related conditions, third trimester: Secondary | ICD-10-CM | POA: Diagnosis not present

## 2014-11-26 DIAGNOSIS — O99213 Obesity complicating pregnancy, third trimester: Secondary | ICD-10-CM

## 2014-11-26 DIAGNOSIS — R102 Pelvic and perineal pain: Secondary | ICD-10-CM

## 2014-11-26 DIAGNOSIS — F17210 Nicotine dependence, cigarettes, uncomplicated: Secondary | ICD-10-CM | POA: Insufficient documentation

## 2014-11-26 DIAGNOSIS — E669 Obesity, unspecified: Secondary | ICD-10-CM | POA: Insufficient documentation

## 2014-11-26 DIAGNOSIS — Z3A33 33 weeks gestation of pregnancy: Secondary | ICD-10-CM | POA: Insufficient documentation

## 2014-11-26 DIAGNOSIS — O24313 Unspecified pre-existing diabetes mellitus in pregnancy, third trimester: Secondary | ICD-10-CM | POA: Diagnosis not present

## 2014-11-26 DIAGNOSIS — O99333 Smoking (tobacco) complicating pregnancy, third trimester: Secondary | ICD-10-CM | POA: Insufficient documentation

## 2014-11-26 DIAGNOSIS — E119 Type 2 diabetes mellitus without complications: Secondary | ICD-10-CM

## 2014-11-26 DIAGNOSIS — O24111 Pre-existing diabetes mellitus, type 2, in pregnancy, first trimester: Secondary | ICD-10-CM | POA: Diagnosis present

## 2014-11-26 DIAGNOSIS — O10019 Pre-existing essential hypertension complicating pregnancy, unspecified trimester: Secondary | ICD-10-CM

## 2014-11-26 DIAGNOSIS — O10913 Unspecified pre-existing hypertension complicating pregnancy, third trimester: Secondary | ICD-10-CM

## 2014-11-26 DIAGNOSIS — O9989 Other specified diseases and conditions complicating pregnancy, childbirth and the puerperium: Secondary | ICD-10-CM | POA: Diagnosis not present

## 2014-11-26 DIAGNOSIS — O99322 Drug use complicating pregnancy, second trimester: Secondary | ICD-10-CM | POA: Diagnosis present

## 2014-11-26 DIAGNOSIS — Z8619 Personal history of other infectious and parasitic diseases: Secondary | ICD-10-CM | POA: Diagnosis present

## 2014-11-26 DIAGNOSIS — O26899 Other specified pregnancy related conditions, unspecified trimester: Secondary | ICD-10-CM

## 2014-11-26 LAB — FETAL FIBRONECTIN: Fetal Fibronectin: NEGATIVE

## 2014-11-26 LAB — OB RESULTS CONSOLE GC/CHLAMYDIA: GC PROBE AMP, GENITAL: NEGATIVE

## 2014-11-26 LAB — WET PREP, GENITAL
Clue Cells Wet Prep HPF POC: NONE SEEN
TRICH WET PREP: NONE SEEN
YEAST WET PREP: NONE SEEN

## 2014-11-26 NOTE — MAU Provider Note (Signed)
History     CSN: [id number]  Arrival date and time: 11/26/14 1355   First Provider Initiated Contact with Patient 11/26/14 1447      Chief Complaint  Patient presents with  . Pelvic Pain   HPI  Patient is 24 y.o. G1P0 [redacted]w[redacted]d here with complaints of pelvic pain and cramping. [name] reports that for the past four days, she has experienced pelvic pain and pressure, especially when standing. She has also experienced cramps over the same time period. She has not tried anything to alleviate her symptoms.  Endorses increased urinary frequency, but reports this has been ongoing throughout her pregnancy. Denies dysuria or hematuria, vaginal itching or burning. +FM, denies LOF, VB, contractions, vaginal discharge.   Past Medical History  Diagnosis Date  . Hypertension   . Obesity   . Prediabetes     "prediabetic; controlled w/diet" (07/17/2012)  . Acute appendicitis 07/18/2012  . Obesity, morbid, BMI 50 or higher 07/18/2012  . GDM (gestational diabetes mellitus)     Past Surgical History  Procedure Laterality Date  . Appendectomy  07/17/2012  . Laparoscopic appendectomy N/A 07/17/2012    Procedure: APPENDECTOMY LAPAROSCOPIC;  Surgeon: Kayla Malady, MD;  Location: [facility] OR;  Service: General;  Laterality: N/A;    Family History  Problem Relation Age of Onset  . Hypertension Mother   . Diabetes Mother   . Hypertension Maternal Grandmother     Social History  Substance Use Topics  . Smoking status: Current Every Day Smoker -- 0.10 packs/day for 1.2 years    Types: Cigarettes  . Smokeless tobacco: Never Used  . Alcohol Use: Yes     Comment: last time couple wks ago but not since finding out was pregnant    Allergies: No Known Allergies  No prescriptions prior to admission    Review of Systems  Eyes: Negative for blurred vision and double vision.  Cardiovascular: Negative for leg swelling.  Gastrointestinal: Negative for nausea, vomiting, abdominal pain, diarrhea and  constipation.  Genitourinary: Positive for frequency. Negative for dysuria, urgency and hematuria.  Musculoskeletal:       Pelvic pain  Neurological: Negative for headaches.   Physical Exam   Blood pressure 156/88, pulse 84, temperature 98.6 F (37 C), temperature source Oral, resp. rate 18, height  (1.448 m), weight 381 lb (172.82 kg), last menstrual period 01/10/2014.  Physical Exam  Nursing note and vitals reviewed. Constitutional: She is oriented to person, place, and time. No distress.  Obese  HENT:  Head: Normocephalic and atraumatic.  Cardiovascular: Normal rate.   Respiratory: Effort normal. No respiratory distress.  GI: Soft. There is no tenderness.  Genitourinary:  Scant white discharge on speculum exam. Cervix closed and thick.  Musculoskeletal: She exhibits no edema or tenderness.  Neurological: She is alert and oriented to person, place, and time. No cranial nerve deficit.  Skin: Skin is warm.  Psychiatric: She has a normal mood and affect. Her behavior is normal.    MAU Course  Procedures None  MDM Fetal fibronectin - negative Wet prep - few WBCs, no clue cells GC/chlamydia culture - pending  Cervical check - closed, thick  Assessment and Plan  A: Patient is 24 y.o. G1P0 [redacted]w[redacted]d reporting pelvic pressure and cramping.  Given negative fetal fibronectin and closed cervix, will not admit at this time.   P: Discharge home - Reviewed findings and my conclusion - Handout given - Follow-up with OB provider - messaged clinic, pt will need weekly NST given  gHTN and Class B GDM - Will f/u GC/chlamydia and contact pt if positive    Kayla Flynn 11/26/2014, 4:34 PM   OB fellow attestation:  I have seen and examined this patient; I agree with above documentation in the resident's note.   Kayla Flynn is a 24 y.o. G1P0 reporting vaginal pressure and discharge.  +FM, denies LOF, VB, contractions, vaginal discharge.  PE: BP 156/88 mmHg  Pulse 84   Temp(Src) 98.6 F (37 C) (Oral)  Resp 18  Ht  (1.448 m)  Wt 381 lb (172.82 kg)  BMI 82.42 kg/m2  LMP 01/10/2014 (Approximate) Gen: calm comfortable, NAD Resp: normal effort, no distress Abd: gravid  ROS, labs, PMH reviewed NST reactive  Plan: - ddx includes PTL, vaginal infection, normal variant- unlikely PTL given exam, and neg FFN.  Wet prep reassuring. follow up GC/CT.  Most likely normal variant.  - fetal kick counts reinforced, preterm labor precautions - continue routine follow up in OB clinic  Federico Flake, MD 11:02 PM

## 2014-11-26 NOTE — MAU Note (Signed)
Pt C/O pelvic pain, hurts to left her legs when getting out of bed or out of the car.  Also having a lot of pelvic pressure.  Denies bleeding or LOF.

## 2014-11-26 NOTE — Discharge Instructions (Signed)
Back Pain in Pregnancy °Back pain during pregnancy is common. It happens in about half of all pregnancies. It is important for you and your baby that you remain active during your pregnancy. If you feel that back pain is not allowing you to remain active or sleep well, it is time to see your caregiver. Back pain may be caused by several factors related to changes during your pregnancy. Fortunately, unless you had trouble with your back before your pregnancy, the pain is likely to get better after you deliver. °Low back pain usually occurs between the fifth and seventh months of pregnancy. It can, however, happen in the first couple months. Factors that increase the risk of back problems include:  °· Previous back problems. °· Injury to your back. °· Having twins or multiple births. °· A chronic cough. °· Stress. °· Job-related repetitive motions. °· Muscle or spinal disease in the back. °· Family history of back problems, ruptured (herniated) discs, or osteoporosis. °· Depression, anxiety, and panic attacks. °CAUSES  °· When you are pregnant, your body produces a hormone called relaxin. This hormone makes the ligaments connecting the low back and pubic bones more flexible. This flexibility allows the baby to be delivered more easily. When your ligaments are loose, your muscles need to work harder to support your back. Soreness in your back can come from tired muscles. Soreness can also come from back tissues that are irritated since they are receiving less support. °· As the baby grows, it puts pressure on the nerves and blood vessels in your pelvis. This can cause back pain. °· As the baby grows and gets heavier during pregnancy, the uterus pushes the stomach muscles forward and changes your center of gravity. This makes your back muscles work harder to maintain good posture. °SYMPTOMS  °Lumbar pain during pregnancy °Lumbar pain during pregnancy usually occurs at or above the waist in the center of the back. There  may be pain and numbness that radiates into your leg or foot. This is similar to low back pain experienced by non-pregnant women. It usually increases with sitting for long periods of time, standing, or repetitive lifting. Tenderness may also be present in the muscles along your upper back. °Posterior pelvic pain during pregnancy °Pain in the back of the pelvis is more common than lumbar pain in pregnancy. It is a deep pain felt in your side at the waistline, or across the tailbone (sacrum), or in both places. You may have pain on one or both sides. This pain can also go into the buttocks and backs of the upper thighs. Pubic and groin pain may also be present. The pain does not quickly resolve with rest, and morning stiffness may also be present. °Pelvic pain during pregnancy can be brought on by most activities. A high level of fitness before and during pregnancy may or may not prevent this problem. Labor pain is usually 1 to 2 minutes apart, lasts for about 1 minute, and involves a bearing down feeling or pressure in your pelvis. However, if you are at term with the pregnancy, constant low back pain can be the beginning of early labor, and you should be aware of this. °DIAGNOSIS  °X-rays of the back should not be done during the first 12 to 14 weeks of the pregnancy and only when absolutely necessary during the rest of the pregnancy. MRIs do not give off radiation and are safe during pregnancy. MRIs also should only be done when absolutely necessary. °HOME CARE INSTRUCTIONS °· Exercise   as directed by your caregiver. Exercise is the most effective way to prevent or manage back pain. If you have a back problem, it is especially important to avoid sports that require sudden body movements. Swimming and walking are great activities. °· Do not stand in one place for long periods of time. °· Do not wear high heels. °· Sit in chairs with good posture. Use a pillow on your lower back if necessary. Make sure your head  rests over your shoulders and is not hanging forward. °· Try sleeping on your side, preferably the left side, with a pillow or two between your legs. If you are sore after a night's rest, your bed may be too soft. Try placing a board between your mattress and box spring. °· Listen to your body when lifting. If you are experiencing pain, ask for help or try bending your knees more so you can use your leg muscles rather than your back muscles. Squat down when picking up something from the floor. Do not bend over. °· Eat a healthy diet. Try to gain weight within your caregiver's recommendations. °· Use heat or cold packs 3 to 4 times a day for 15 minutes to help with the pain. °· Only take over-the-counter or prescription medicines for pain, discomfort, or fever as directed by your caregiver. °Sudden (acute) back pain °· Use bed rest for only the most extreme, acute episodes of back pain. Prolonged bed rest over 48 hours will aggravate your condition. °· Ice is very effective for acute conditions. °¨ Put ice in a plastic bag. °¨ Place a towel between your skin and the bag. °¨ Leave the ice on for 10 to 20 minutes every 2 hours, or as needed. °· Using heat packs for 30 minutes prior to activities is also helpful. °Continued back pain °See your caregiver if you have continued problems. Your caregiver can help or refer you for appropriate physical therapy. With conditioning, most back problems can be avoided. Sometimes, a more serious issue may be the cause of back pain. You should be seen right away if new problems seem to be developing. Your caregiver may recommend: °· A maternity girdle. °· An elastic sling. °· A back brace. °· A massage therapist or acupuncture. °SEEK MEDICAL CARE IF:  °· You are not able to do most of your daily activities, even when taking the pain medicine you were given. °· You need a referral to a physical therapist or chiropractor. °· You want to try acupuncture. °SEEK IMMEDIATE MEDICAL CARE  IF: °· You develop numbness, tingling, weakness, or problems with the use of your arms or legs. °· You develop severe back pain that is no longer relieved with medicines. °· You have a sudden change in bowel or bladder control. °· You have increasing pain in other areas of the body. °· You develop shortness of breath, dizziness, or fainting. °· You develop nausea, vomiting, or sweating. °· You have back pain which is similar to labor pains. °· You have back pain along with your water breaking or vaginal bleeding. °· You have back pain or numbness that travels down your leg. °· Your back pain developed after you fell. °· You develop pain on one side of your back. You may have a kidney stone. °· You see blood in your urine. You may have a bladder infection or kidney stone. °· You have back pain with blisters. You may have shingles. °Back pain is fairly common during pregnancy but should not be accepted as just part of   the process. Back pain should always be treated as soon as possible. This will make your pregnancy as pleasant as possible. Document Released: 06/01/2005 Document Revised: 05/16/2011 Document Reviewed: 07/13/2010 [facility] Patient Information 2015 [place], [place]. This information is not intended to replace advice given to you by your health care provider. Make sure you discuss any questions you have with your health care provider. Abdominal Pain During Pregnancy Belly (abdominal) pain is common during pregnancy. Most of the time, it is not a serious problem. Other times, it can be a sign that something is wrong with the pregnancy. Always tell your doctor if you have belly pain. HOME CARE Monitor your belly pain for any changes. The following actions may help you feel better:  Do not have sex (intercourse) or put anything in your vagina until you feel better.  Rest until your pain stops.  Drink clear fluids if you feel sick to your stomach (nauseous). Do not eat solid food until you feel  better.  Only take medicine as told by your doctor.  Keep all doctor visits as told. GET HELP RIGHT AWAY IF:   You are bleeding, leaking fluid, or pieces of tissue come out of your vagina.  You have more pain or cramping.  You keep throwing up (vomiting).  You have pain when you pee (urinate) or have blood in your pee.  You have a fever.  You do not feel your baby moving as much.  You feel very weak or feel like passing out.  You have trouble breathing, with or without belly pain.  You have a very bad headache and belly pain.  You have fluid leaking from your vagina and belly pain.  You keep having watery poop (diarrhea).  Your belly pain does not go away after resting, or the pain gets worse. MAKE SURE YOU:   Understand these instructions.  Will watch your condition.  Will get help right away if you are not doing well or get worse. Document Released: 02/09/2009 Document Revised: 10/24/2012 Document Reviewed: 09/20/2012 [facility] Patient Information 2015 [place], [place]. This information is not intended to replace advice given to you by your health care provider. Make sure you discuss any questions you have with your health care provider. Premature Rupture and Preterm Premature Rupture of Membranes A sac made up of membranes surrounds your baby in the womb (uterus). When this sac breaks before contractions or labor starts, it is called premature rupture of membranes (PROM). Rupture of membranes is also known as your water breaking. If this happens before 37 weeks, it is called preterm premature rupture of membranes (PPROM). PPROM is serious. It needs medical care right away. CAUSES  PROM may be caused by the membranes getting weak. This happens at the end of pregnancy. PPROM is often due to an infection, but can be caused by a number of other things.  SIGNS OF PROM OR PPROM  A sudden gush of fluid from the vagina.  A slow leak of fluid from the vagina.  Your underwear  stay wet. WHAT TO DO IF YOU THINK YOUR WATER BROKE Call your doctor right away. You will need to go to the hospital to get checked right away. WHAT HAPPENS IF YOU ARE TOLD YOU HAVE PROM OR PPROM? You will have tests done at the hospital. If you have PROM, you may be given medicine to start labor (induced). This may happen if you are not having contractions within 24 hours of your water breaking. If you have PPROM  and are not having contractions, you may be given medicine to start labor. It will depend on how far along you are in your pregnancy. If you have PPROM, you:  And your baby will be watched closely for signs of infection or other problems.  May be given an antibiotic medicine. This can stop an infection from starting.  May be given a steroid medicine. This can help the lungs to develop faster.  May be given a medicine to stop early labor (preterm labor).  May be told to stay in bed except to use the restroom (bed rest).  May be given medicine to start labor. This can happen if there are problems with you or the baby. Your treatment will depend on many factors. Document Released: 05/20/2008 Document Revised: 10/24/2012 Document Reviewed: 06/12/2012 [facility] Patient Information 2015 [place], [place]. This information is not intended to replace advice given to you by your health care provider. Make sure you discuss any questions you have with your health care provider.

## 2014-11-27 LAB — GC/CHLAMYDIA PROBE AMP (~~LOC~~) NOT AT ARMC
CHLAMYDIA, DNA PROBE: NEGATIVE
NEISSERIA GONORRHEA: NEGATIVE

## 2014-12-01 ENCOUNTER — Encounter: Payer: Medicaid Other | Attending: Obstetrics & Gynecology | Admitting: *Deleted

## 2014-12-01 ENCOUNTER — Ambulatory Visit (INDEPENDENT_AMBULATORY_CARE_PROVIDER_SITE_OTHER): Payer: Medicaid Other | Admitting: Obstetrics & Gynecology

## 2014-12-01 VITALS — BP 143/73 | HR 85 | Wt 379.4 lb

## 2014-12-01 DIAGNOSIS — O10013 Pre-existing essential hypertension complicating pregnancy, third trimester: Secondary | ICD-10-CM | POA: Diagnosis present

## 2014-12-01 DIAGNOSIS — O10019 Pre-existing essential hypertension complicating pregnancy, unspecified trimester: Secondary | ICD-10-CM

## 2014-12-01 DIAGNOSIS — O24113 Pre-existing diabetes mellitus, type 2, in pregnancy, third trimester: Secondary | ICD-10-CM

## 2014-12-01 DIAGNOSIS — O10913 Unspecified pre-existing hypertension complicating pregnancy, third trimester: Secondary | ICD-10-CM

## 2014-12-01 DIAGNOSIS — Z713 Dietary counseling and surveillance: Secondary | ICD-10-CM | POA: Diagnosis not present

## 2014-12-01 DIAGNOSIS — O24313 Unspecified pre-existing diabetes mellitus in pregnancy, third trimester: Secondary | ICD-10-CM | POA: Insufficient documentation

## 2014-12-01 DIAGNOSIS — O24111 Pre-existing diabetes mellitus, type 2, in pregnancy, first trimester: Secondary | ICD-10-CM

## 2014-12-01 DIAGNOSIS — O24419 Gestational diabetes mellitus in pregnancy, unspecified control: Secondary | ICD-10-CM

## 2014-12-01 LAB — POCT URINALYSIS DIP (DEVICE)
BILIRUBIN URINE: NEGATIVE
Glucose, UA: NEGATIVE mg/dL
Ketones, ur: NEGATIVE mg/dL
Leukocytes, UA: NEGATIVE
NITRITE: NEGATIVE
PH: 6.5 (ref 5.0–8.0)
Protein, ur: 100 mg/dL — AB
Specific Gravity, Urine: 1.025 (ref 1.005–1.030)
UROBILINOGEN UA: 0.2 mg/dL (ref 0.0–1.0)

## 2014-12-01 MED ORDER — ACCU-CHEK AVIVA CONNECT W/DEVICE KIT: 1.0000 | PACK | Freq: Four times a day (QID) | Status: DC

## 2014-12-01 MED ORDER — ACCU-CHEK FASTCLIX LANCETS MISC: 1.0000 | Freq: Four times a day (QID) | Status: DC

## 2014-12-01 MED ORDER — GLUCOSE BLOOD VI STRP: ORAL_STRIP | Status: DC

## 2014-12-01 NOTE — Assessment & Plan Note (Addendum)
Per Dr. Penne Lash - pt will have weekly BPP @ [facility] for fetal testing - NST's not needed

## 2014-12-01 NOTE — Progress Notes (Signed)
Patient presents for review of glucose readings. She does not have her meter nor her log book. She states for FBS range 70-80mg /dl and 2hpp range [id number] /dl. We review glucose parameters. I made it clear that it was critical that she bring her meter and log to every visit.

## 2014-12-01 NOTE — Progress Notes (Signed)
Pt denies H/A or visual disturbances. Optho appt today.

## 2014-12-01 NOTE — Assessment & Plan Note (Signed)
Weekly BPP @ [facility] for fetal testing - NST's not needed

## 2014-12-02 NOTE — Progress Notes (Signed)
Subjective:  Kayla Flynn is a 24 y.o. G1P0 at 42w5dbeing seen today for ongoing prenatal care.  Patient reports no complaints.  Contractions: Not present.  Vag. Bleeding: None. Movement: Present. Denies leaking of fluid.   The following portions of the patient's history were reviewed and updated as appropriate: allergies, current medications, past family history, past medical history, past social history, past surgical history and problem list.   Objective:   Filed Vitals:   12/01/14 0901  BP: 143/73  Pulse: 85  Weight: 379 lb 6.4 oz (172.095 kg)    Fetal Status:     Movement: Present     General:  Alert, oriented and cooperative. Patient is in no acute distress.  Skin: Skin is warm and dry. No rash noted.   Cardiovascular: Normal heart rate noted  Respiratory: Normal respiratory effort, no problems with respiration noted  Abdomen: Soft, gravid, appropriate for gestational age. Pain/Pressure: Present     Pelvic: Vag. Bleeding: None     Cervical exam deferred        Extremities: Normal range of motion.  Edema: Mild pitting, slight indentation  Mental Status: Normal mood and affect. Normal behavior. Normal judgment and thought content.   Urinalysis:      Assessment and Plan:  Pregnancy: G1P0 at 316w5d1. Pre-existing diabetes mellitus in pregnancy in third trimester Patient did not bring CBG log but reports normal values. - Blood Glucose Monitoring Suppl (ACCU-CHEK AVIVA CONNECT) W/DEVICE KIT; 1 each by Does not apply route 4 (four) times daily.  Dispense: 1 kit; Refill: 0 - glucose blood (ACCU-CHEK AVIVA) test strip; DX O24.419 GDM for testing 4 times daily  Dispense: 100 each; Refill: 12 - ACCU-CHEK FASTCLIX LANCETS MISC; Inject 1 each into the skin 4 (four) times daily. New DX GDM for testing 4 times daily DX O24.419  Dispense: 102 each; Refill: 12  2. Pre-existing hypertension complicating pregnancy in third trimester BP 143/73.  Stable for now.  UA not on chart at time of  closure. PT is a difficult NST and will be getting BPPs in MFM for rest of pregnancy.  Preterm labor symptoms and general obstetric precautions including but not limited to vaginal bleeding, contractions, leaking of fluid and fetal movement were reviewed in detail with the patient. Please refer to After Visit Summary for other counseling recommendations.  Return in about 1 week (around 12/08/2014) for Ob fu only.   KeGuss BundeMD

## 2014-12-03 ENCOUNTER — Inpatient Hospital Stay (HOSPITAL_COMMUNITY): Admission: RE | Admit: 2014-12-03 | Payer: Medicaid Other | Source: Ambulatory Visit

## 2014-12-03 ENCOUNTER — Ambulatory Visit (HOSPITAL_COMMUNITY): Payer: Medicaid Other | Attending: Obstetrics and Gynecology

## 2014-12-08 ENCOUNTER — Encounter: Payer: Medicaid Other | Admitting: Obstetrics & Gynecology

## 2014-12-10 ENCOUNTER — Encounter (HOSPITAL_COMMUNITY): Payer: Self-pay | Admitting: Anesthesiology

## 2014-12-10 ENCOUNTER — Ambulatory Visit (HOSPITAL_COMMUNITY)
Admission: RE | Admit: 2014-12-10 | Discharge: 2014-12-10 | Disposition: A | Discharge status: Home / Self Care | Payer: Medicaid Other | Source: Ambulatory Visit | Attending: Obstetrics and Gynecology | Admitting: Obstetrics and Gynecology

## 2014-12-10 ENCOUNTER — Encounter (HOSPITAL_COMMUNITY): Payer: Self-pay | Admitting: *Deleted

## 2014-12-10 ENCOUNTER — Inpatient Hospital Stay (HOSPITAL_COMMUNITY)
Admission: AD | Admit: 2014-12-10 | Discharge: 2014-12-12 | DRG: 781 | Disposition: A | Discharge status: Home / Self Care | Payer: Medicaid Other | Source: Ambulatory Visit | Attending: Obstetrics and Gynecology | Admitting: Obstetrics and Gynecology

## 2014-12-10 ENCOUNTER — Inpatient Hospital Stay (HOSPITAL_COMMUNITY): Admission: RE | Admit: 2014-12-10 | Payer: Medicaid Other | Source: Ambulatory Visit

## 2014-12-10 DIAGNOSIS — O139 Gestational [pregnancy-induced] hypertension without significant proteinuria, unspecified trimester: Secondary | ICD-10-CM | POA: Insufficient documentation

## 2014-12-10 DIAGNOSIS — O10912 Unspecified pre-existing hypertension complicating pregnancy, second trimester: Secondary | ICD-10-CM | POA: Diagnosis present

## 2014-12-10 DIAGNOSIS — E119 Type 2 diabetes mellitus without complications: Secondary | ICD-10-CM | POA: Diagnosis present

## 2014-12-10 DIAGNOSIS — O10913 Unspecified pre-existing hypertension complicating pregnancy, third trimester: Secondary | ICD-10-CM

## 2014-12-10 DIAGNOSIS — O24111 Pre-existing diabetes mellitus, type 2, in pregnancy, first trimester: Secondary | ICD-10-CM | POA: Diagnosis present

## 2014-12-10 DIAGNOSIS — F17210 Nicotine dependence, cigarettes, uncomplicated: Secondary | ICD-10-CM | POA: Diagnosis present

## 2014-12-10 DIAGNOSIS — Z8249 Family history of ischemic heart disease and other diseases of the circulatory system: Secondary | ICD-10-CM

## 2014-12-10 DIAGNOSIS — Z8632 Personal history of gestational diabetes: Secondary | ICD-10-CM

## 2014-12-10 DIAGNOSIS — O10019 Pre-existing essential hypertension complicating pregnancy, unspecified trimester: Secondary | ICD-10-CM

## 2014-12-10 DIAGNOSIS — Z833 Family history of diabetes mellitus: Secondary | ICD-10-CM | POA: Diagnosis not present

## 2014-12-10 DIAGNOSIS — O133 Gestational [pregnancy-induced] hypertension without significant proteinuria, third trimester: Secondary | ICD-10-CM

## 2014-12-10 DIAGNOSIS — Z3A35 35 weeks gestation of pregnancy: Secondary | ICD-10-CM

## 2014-12-10 DIAGNOSIS — O99333 Smoking (tobacco) complicating pregnancy, third trimester: Secondary | ICD-10-CM | POA: Diagnosis present

## 2014-12-10 DIAGNOSIS — R030 Elevated blood-pressure reading, without diagnosis of hypertension: Secondary | ICD-10-CM | POA: Diagnosis present

## 2014-12-10 DIAGNOSIS — O1493 Unspecified pre-eclampsia, third trimester: Secondary | ICD-10-CM | POA: Diagnosis not present

## 2014-12-10 DIAGNOSIS — O113 Pre-existing hypertension with pre-eclampsia, third trimester: Principal | ICD-10-CM | POA: Diagnosis present

## 2014-12-10 DIAGNOSIS — O1410 Severe pre-eclampsia, unspecified trimester: Secondary | ICD-10-CM | POA: Diagnosis present

## 2014-12-10 DIAGNOSIS — O24113 Pre-existing diabetes mellitus, type 2, in pregnancy, third trimester: Secondary | ICD-10-CM | POA: Diagnosis present

## 2014-12-10 HISTORY — DX: Severe pre-eclampsia, unspecified trimester: O14.10

## 2014-12-10 LAB — COMPREHENSIVE METABOLIC PANEL
ALBUMIN: 2.5 g/dL — AB (ref 3.5–5.0)
ALT: 14 U/L (ref 14–54)
AST: 19 U/L (ref 15–41)
Alkaline Phosphatase: 178 U/L — ABNORMAL HIGH (ref 38–126)
Anion gap: 5 (ref 5–15)
BILIRUBIN TOTAL: 0.2 mg/dL — AB (ref 0.3–1.2)
BUN: 6 mg/dL (ref 6–20)
CHLORIDE: 107 mmol/L (ref 101–111)
CO2: 27 mmol/L (ref 22–32)
CREATININE: 0.63 mg/dL (ref 0.44–1.00)
Calcium: 9.2 mg/dL (ref 8.9–10.3)
GFR calc Af Amer: >60 mL/min (ref >60)
GLUCOSE: 83 mg/dL (ref 65–99)
POTASSIUM: 4.3 mmol/L (ref 3.5–5.1)
Sodium: 139 mmol/L (ref 135–145)
TOTAL PROTEIN: 6.4 g/dL — AB (ref 6.5–8.1)

## 2014-12-10 LAB — TYPE AND SCREEN
ABO/RH(D): O POS
ANTIBODY SCREEN: NEGATIVE

## 2014-12-10 LAB — GLUCOSE, CAPILLARY
GLUCOSE-CAPILLARY: 108 mg/dL — AB (ref 65–99)
Glucose-Capillary: 99 mg/dL (ref 65–99)

## 2014-12-10 LAB — CBC
HEMATOCRIT: 36.5 % (ref 36.0–46.0)
Hemoglobin: 11.9 g/dL — ABNORMAL LOW (ref 12.0–15.0)
MCH: 30.4 pg (ref 26.0–34.0)
MCHC: 32.6 g/dL (ref 30.0–36.0)
MCV: 93.4 fL (ref 78.0–100.0)
Platelets: 242 10*3/uL (ref 150–400)
RBC: 3.91 MIL/uL (ref 3.87–5.11)
RDW: 14.1 % (ref 11.5–15.5)
WBC: 7 10*3/uL (ref 4.0–10.5)

## 2014-12-10 LAB — URIC ACID: URIC ACID, SERUM: 6.8 mg/dL — AB (ref 2.3–6.6)

## 2014-12-10 LAB — LACTATE DEHYDROGENASE: LDH: 162 U/L (ref 98–192)

## 2014-12-10 LAB — PROTEIN / CREATININE RATIO, URINE
CREATININE, URINE: 120 mg/dL
Protein Creatinine Ratio: 0.63 mg/mg{Cre} — ABNORMAL HIGH (ref 0.00–0.15)
Total Protein, Urine: 75 mg/dL

## 2014-12-10 MED ORDER — PRENATAL MULTIVITAMIN CH
1.0000 | ORAL_TABLET | Freq: Every day | ORAL | Status: DC
  Administered 2014-12-11 – 2014-12-12 (×2): 1 via ORAL
  Filled 2014-12-10 (×2): qty 1

## 2014-12-10 MED ORDER — BETAMETHASONE SOD PHOS & ACET 6 (3-3) MG/ML IJ SUSP
12.0000 mg | Freq: Once | INTRAMUSCULAR | Status: AC
  Administered 2014-12-10: 12 mg via INTRAMUSCULAR
  Filled 2014-12-10: qty 2

## 2014-12-10 MED ORDER — ZOLPIDEM TARTRATE 5 MG PO TABS: 5.0000 mg | ORAL_TABLET | Freq: Every evening | ORAL | Status: DC | PRN

## 2014-12-10 MED ORDER — ACETAMINOPHEN 325 MG PO TABS
650.0000 mg | ORAL_TABLET | ORAL | Status: DC | PRN
  Administered 2014-12-11: 650 mg via ORAL
  Filled 2014-12-10: qty 2

## 2014-12-10 MED ORDER — LACTATED RINGERS IV SOLN
INTRAVENOUS | Status: DC
  Administered 2014-12-11 – 2014-12-12 (×3): via INTRAVENOUS

## 2014-12-10 MED ORDER — LABETALOL HCL 100 MG PO TABS
200.0000 mg | ORAL_TABLET | Freq: Two times a day (BID) | ORAL | Status: DC
  Administered 2014-12-10 – 2014-12-12 (×4): 200 mg via ORAL
  Filled 2014-12-10 (×4): qty 2

## 2014-12-10 MED ORDER — CALCIUM CARBONATE ANTACID 500 MG PO CHEW
2.0000 | CHEWABLE_TABLET | ORAL | Status: DC | PRN
  Administered 2014-12-10 – 2014-12-12 (×2): 400 mg via ORAL
  Filled 2014-12-10 (×2): qty 2

## 2014-12-10 MED ORDER — LABETALOL HCL 5 MG/ML IV SOLN
20.0000 mg | INTRAVENOUS | Status: AC | PRN
  Administered 2014-12-10: 20 mg via INTRAVENOUS
  Administered 2014-12-10: 40 mg via INTRAVENOUS
  Filled 2014-12-10: qty 8

## 2014-12-10 MED ORDER — DOCUSATE SODIUM 100 MG PO CAPS
100.0000 mg | ORAL_CAPSULE | Freq: Every day | ORAL | Status: DC
  Administered 2014-12-11: 100 mg via ORAL
  Filled 2014-12-10 (×3): qty 1

## 2014-12-10 MED ORDER — HYDRALAZINE HCL 20 MG/ML IJ SOLN: 5.0000 mg | INTRAMUSCULAR | Status: DC | PRN

## 2014-12-10 MED ORDER — BETAMETHASONE SOD PHOS & ACET 6 (3-3) MG/ML IJ SUSP
12.0000 mg | Freq: Once | INTRAMUSCULAR | Status: AC
  Administered 2014-12-11: 12 mg via INTRAMUSCULAR
  Filled 2014-12-10: qty 2

## 2014-12-10 MED ORDER — LABETALOL HCL 5 MG/ML IV SOLN
INTRAVENOUS | Status: AC
  Filled 2014-12-10: qty 4

## 2014-12-10 MED ORDER — HYDRALAZINE HCL 20 MG/ML IJ SOLN
10.0000 mg | Freq: Once | INTRAMUSCULAR | Status: DC | PRN
  Filled 2014-12-10: qty 1

## 2014-12-10 MED ORDER — LABETALOL HCL 5 MG/ML IV SOLN
20.0000 mg | INTRAVENOUS | Status: DC | PRN
  Administered 2014-12-11: 20 mg via INTRAVENOUS
  Filled 2014-12-10: qty 4

## 2014-12-10 NOTE — Consult Note (Signed)
Kayla Flynn is a very pleasant G1P0 presenting for anesthesia consult regarding hospitalization and upcoming delivery. She is currently being evaluated for PIH. She has a past medical history significant for super morbid obesity with a BMI of 81, chronic HTN, and prediabetes. She was once on a diuretic for her HTN but was taken off that prior to this pregnancy. She denies any problems with anesthesia or family history of problems with anesthesia. She has NKDA. She did have a general anesthetic in 2014 which was an RSI so no comment on her ability to be ventilated but did note a grade 1 view with a MAC 3 blade. Her airway exam is notable for a thick neck, short TMD, however good mouth opening, neck ROM, and a MP II. This could certainly change however throughout labor and/or if preeclampsia developed. No easily palpable landmarks were appreciated on her back and she is quite short all of which may point to a difficult epidural placement as well. I spoke with the patient extensively about her plan for labor and delivery. She does desire an epidural for labor analgesia, and I very much support this plan. I discussed with her the risks/benefits/options and addressed all of her questions and concerns. I recommended that since she was planning already to have an epidural, given the potential challenges of placement and our desire to avoid a general anesthetic if possible during her delivery, early epidural placement would be the best option. She will discuss this with her labor and delivery nurse and ob team and request our services once delivery is planned. Please feel free to call anesthesia with any questions or concerns. Please call as early as possible if there are any signs of maternal or fetal distress as this would be a very challenging patient to quickly move back to the operating room in an emergency case. An Anesthesiologist can be reached 24hrs a day at [phone].

## 2014-12-10 NOTE — H&P (Signed)
Expand All Collapse All    History     CSN: [id number]  Arrival date and time: 12/10/14 1304  None    Chief Complaint  Patient presents with  . Hypertension   HPI Kayla Flynn is a 24 y.o. G1P0 at 62w6dwho [facility] Elevated BPs while in ultrasound today.  Was once on BP meds as a teenager but states hasn't been told she has hypertension since then.  Denies headache, vision changes, or epigastric pain. Denies contractions, LOF or vaginal bleeding.  Positive fetal movement.   OB History    Gravida Para Term Preterm AB TAB SAB Ectopic Multiple Living   1               Past Medical History  Diagnosis Date  . Hypertension   . Obesity   . Prediabetes     "prediabetic; controlled w/diet" (07/17/2012)  . Acute appendicitis 07/18/2012  . Obesity, morbid, BMI 50 or higher ([place] 07/18/2012  . GDM (gestational diabetes mellitus)     Past Surgical History  Procedure Laterality Date  . Appendectomy  07/17/2012  . Laparoscopic appendectomy N/A 07/17/2012    Procedure: APPENDECTOMY LAPAROSCOPIC; Surgeon: BZenovia Jarred MD; [facility]; Service: General; Laterality: N/A;    Family History  Problem Relation Age of Onset  . Hypertension Mother   . Diabetes Mother   . Hypertension Maternal Grandmother     Social History  Substance Use Topics  . Smoking status: Current Every Day Smoker -- 0.25 packs/day for 1.2 years    Types: Cigarettes  . Smokeless tobacco: Never Used  . Alcohol Use: Yes     Comment: last time couple wks ago but not since finding out was pregnant    Allergies: No Known Allergies  Prescriptions prior to admission  Medication Sig Dispense Refill Last Dose  . ACCU-CHEK FASTCLIX LANCETS MISC Inject 1 each into the skin 4 (four) times daily. New DX GDM for testing 4 times daily DX O24.419 102  each 12   . acetaminophen (TYLENOL) 500 MG tablet Take 1,000 mg by mouth every 6 (six) hours as needed for mild pain.   Taking  . aspirin EC 81 MG tablet Take 1 tablet (81 mg total) by mouth daily. 30 tablet 11 Taking  . Blood Glucose Monitoring Suppl (ACCU-CHEK AVIVA CONNECT) W/DEVICE KIT 1 each by Does not apply route 4 (four) times daily. DX O24.419 GDM for testing 4 times daily     . Blood Glucose Monitoring Suppl (ACCU-CHEK AVIVA CONNECT) W/DEVICE KIT 1 each by Does not apply route 4 (four) times daily. 1 kit 0   . Butalbital-APAP-Caffeine 50-325-40 MG per capsule Take 1-2 capsules by mouth every 6 (six) hours as needed for headache. (Patient not taking: Reported on 12/01/2014) 6 capsule 0 Not Taking  . calcium carbonate (TUMS - DOSED IN MG ELEMENTAL CALCIUM) 500 MG chewable tablet Chew 1 tablet by mouth as needed for indigestion or heartburn.   Taking  . glucose blood (ACCU-CHEK AVIVA) test strip DX O24.419 GDM for testing 4 times daily 100 each 12   . glucose blood (ACCU-CHEK SMARTVIEW) test strip New DX GDM for testing 4 times daily DX O24.419 100 each 12 11/25/2014 at Unknown time  . metFORMIN (GLUCOPHAGE) 500 MG tablet Take 1 tablet (500 mg total) by mouth 2 (two) times daily with a meal. 60 tablet 3 Taking  . Prenatal Vit-Fe Fumarate-FA (PRENATAL COMPLETE) 14-0.4 MG TABS Take 1 tablet by mouth  daily. 30 each 6 Taking    Review of Systems  Constitutional: Negative.  HENT: Negative.  Eyes: Negative.  Respiratory: Negative.  Cardiovascular: Negative.  Gastrointestinal: Negative.  Genitourinary: Negative.   Physical Exam   Blood pressure 145/90, pulse 79, temperature 98.1 F (36.7 C), temperature source Oral, resp. rate 18, last menstrual period 01/10/2014.   Patient Vitals for the past 24 hrs:  BP Temp Temp src Pulse Resp  12/10/14 1422 143/85 mmHg - - 83 -  12/10/14 1412 143/84 mmHg - - 75 -    12/10/14 1402 148/80 mmHg - - 72 -  12/10/14 1352 140/76 mmHg - - 72 -  12/10/14 1342 151/82 mmHg - - 84 -  12/10/14 1332 145/90 mmHg - - 79 -  12/10/14 1330 153/89 mmHg 98.1 F (36.7 C) Oral 82 18  12/10/14 1328 153/89 mmHg - - 82 -     Physical Exam  Nursing note and vitals reviewed. Constitutional: She is oriented to person, place, and time. She appears well-developed and well-nourished. No distress.  HENT:  Head: Normocephalic and atraumatic.  Eyes: Conjunctivae are normal. Right eye exhibits no discharge. Left eye exhibits no discharge. No scleral icterus.  Neck: Normal range of motion.  Cardiovascular: Normal rate, regular rhythm and normal heart sounds.  No murmur heard. Respiratory: Effort normal and breath sounds normal. No respiratory distress. She has no wheezes.  GI: Soft. There is no tenderness.  Neurological: She is alert and oriented to person, place, and time. She has normal reflexes.  No clonus  Skin: Skin is warm and dry. She is not diaphoretic.  Psychiatric: She has a normal mood and affect. Her behavior is normal. Judgment and thought content normal.    MAU Course  Procedures  Lab Results Last 24 Hours    Results for orders placed or performed during the hospital encounter of 12/10/14 (from the past 24 hour(s))  CBC Status: Abnormal   Collection Time: 12/10/14 1:34 PM  Result Value Ref Range   WBC 7.0 4.0 - 10.5 K/uL   RBC 3.91 3.87 - 5.11 MIL/uL   Hemoglobin 11.9 (L) 12.0 - 15.0 g/dL   HCT 36.5 36.0 - 46.0 %   MCV 93.4 78.0 - 100.0 fL   MCH 30.4 26.0 - 34.0 pg   MCHC 32.6 30.0 - 36.0 g/dL   RDW 14.1 11.5 - 15.5 %   Platelets 242 150 - 400 K/uL  Comprehensive metabolic panel Status: Abnormal   Collection Time: 12/10/14 1:34 PM  Result Value Ref Range   Sodium 139 135 - 145 mmol/L   Potassium 4.3 3.5 - 5.1 mmol/L   Chloride 107 101 - 111  mmol/L   CO2 27 22 - 32 mmol/L   Glucose, Bld 83 65 - 99 mg/dL   BUN 6 6 - 20 mg/dL   Creatinine, Ser 0.63 0.44 - 1.00 mg/dL   Calcium 9.2 8.9 - 10.3 mg/dL   Total Protein 6.4 (L) 6.5 - 8.1 g/dL   Albumin 2.5 (L) 3.5 - 5.0 g/dL   AST 19 15 - 41 U/L   ALT 14 14 - 54 U/L   Alkaline Phosphatase 178 (H) 38 - 126 U/L   Total Bilirubin 0.2 (L) 0.3 - 1.2 mg/dL   GFR calc non Af Amer >60 >60 mL/min   GFR calc Af Amer >60 >60 mL/min   Anion gap 5 5 - 15  Lactate dehydrogenase Status: None   Collection Time: 12/10/14 1:34 PM  Result Value Ref Range  LDH 162 98 - 192 U/L  Uric acid Status: Abnormal   Collection Time: 12/10/14 1:34 PM  Result Value Ref Range   Uric Acid, Serum 6.8 (H) 2.3 - 6.6 mg/dL  Protein / creatinine ratio, urine Status: Abnormal   Collection Time: 12/10/14 2:35 PM  Result Value Ref Range   Creatinine, Urine 120.00 mg/dL   Total Protein, Urine 75 mg/dL   Protein Creatinine Ratio 0.63 (H) 0.00 - 0.15 mg/mg[Cre]      MDM BPP 8/8 in MFM prior to arrival.  FHT 144 by doppler in MAU.  1530-Dr. Constant regarding labs results & BPs in MAU. Pt to be admitted to antenatal for observation & BMZ Assessment and Plan  A: 1. Gestational hypertension, third trimester   2. Chronic benign essential hypertension, antepartum   3. Pre-existing type 2 diabetes mellitus during pregnancy in first trimester ([place])    P: Admit to observation in anterpartum BMZ Monitors BPs Care turned over to Hansel Feinstein, CNM  Jorje Guild, NP 12/10/2014, 1:35 PM       Cosigned by: Mora Bellman, MD at 12/10/2014 3:39 PM  Revision History     Date/Time User Provider Type Action   12/10/2014 3:39 PM Mora Bellman, MD Physician Cosign   12/10/2014 3:34 PM Jorje Guild, NP Nurse Practitioner Sign   12/10/2014 2:39 PM Jorje Guild, NP Nurse Practitioner Incomplete  Revision   12/10/2014 1:45 PM Hillary Corinda Gubler, MD Resident Incomplete Revision   12/10/2014 1:45 PM Jorje Guild, NP Nurse Practitioner Sign   View Details Report

## 2014-12-10 NOTE — MAU Provider Note (Signed)
History     CSN: [id number]  Arrival date and time: 12/10/14 1304   None     Chief Complaint  Patient presents with  . Hypertension   HPI Kayla Flynn is a 24 y.o. G1P0 at 66w6dwho [facility] Elevated BPs while in ultrasound today.  Was once on BP meds as a teenager but states hasn't been told she has hypertension since then.  Denies headache, vision changes, or epigastric pain. Denies contractions, LOF or vaginal bleeding.  Positive fetal movement.   OB History    Gravida Para Term Preterm AB TAB SAB Ectopic Multiple Living   1               Past Medical History  Diagnosis Date  . Hypertension   . Obesity   . Prediabetes     "prediabetic; controlled w/diet" (07/17/2012)  . Acute appendicitis 07/18/2012  . Obesity, morbid, BMI 50 or higher ([place] 07/18/2012  . GDM (gestational diabetes mellitus)     Past Surgical History  Procedure Laterality Date  . Appendectomy  07/17/2012  . Laparoscopic appendectomy N/A 07/17/2012    Procedure: APPENDECTOMY LAPAROSCOPIC;  Surgeon: BZenovia Jarred MD;  [facility];  Service: General;  Laterality: N/A;    Family History  Problem Relation Age of Onset  . Hypertension Mother   . Diabetes Mother   . Hypertension Maternal Grandmother     Social History  Substance Use Topics  . Smoking status: Current Every Day Smoker -- 0.25 packs/day for 1.2 years    Types: Cigarettes  . Smokeless tobacco: Never Used  . Alcohol Use: Yes     Comment: last time couple wks ago but not since finding out was pregnant    Allergies: No Known Allergies  Prescriptions prior to admission  Medication Sig Dispense Refill Last Dose  . ACCU-CHEK FASTCLIX LANCETS MISC Inject 1 each into the skin 4 (four) times daily. New DX GDM for testing 4 times daily DX O24.419 102 each 12   . acetaminophen (TYLENOL) 500 MG tablet Take 1,000 mg by mouth every 6 (six) hours as needed for mild pain.   Taking  . aspirin EC 81 MG tablet Take  1 tablet (81 mg total) by mouth daily. 30 tablet 11 Taking  . Blood Glucose Monitoring Suppl (ACCU-CHEK AVIVA CONNECT) W/DEVICE KIT 1 each by Does not apply route 4 (four) times daily. DX O24.419 GDM for testing 4 times daily     . Blood Glucose Monitoring Suppl (ACCU-CHEK AVIVA CONNECT) W/DEVICE KIT 1 each by Does not apply route 4 (four) times daily. 1 kit 0   . Butalbital-APAP-Caffeine 50-325-40 MG per capsule Take 1-2 capsules by mouth every 6 (six) hours as needed for headache. (Patient not taking: Reported on 12/01/2014) 6 capsule 0 Not Taking  . calcium carbonate (TUMS - DOSED IN MG ELEMENTAL CALCIUM) 500 MG chewable tablet Chew 1 tablet by mouth as needed for indigestion or heartburn.   Taking  . glucose blood (ACCU-CHEK AVIVA) test strip DX O24.419 GDM for testing 4 times daily 100 each 12   . glucose blood (ACCU-CHEK SMARTVIEW) test strip New DX GDM for testing 4 times daily DX O24.419 100 each 12 11/25/2014 at Unknown time  . metFORMIN (GLUCOPHAGE) 500 MG tablet Take 1 tablet (500 mg total) by mouth 2 (two) times daily with a meal. 60 tablet 3 Taking  . Prenatal Vit-Fe Fumarate-FA (PRENATAL COMPLETE) 14-0.4 MG TABS Take 1 tablet by mouth daily.  30 each 6 Taking    Review of Systems  Constitutional: Negative.   HENT: Negative.   Eyes: Negative.   Respiratory: Negative.   Cardiovascular: Negative.   Gastrointestinal: Negative.   Genitourinary: Negative.    Physical Exam   Blood pressure 145/90, pulse 79, temperature 98.1 F (36.7 C), temperature source Oral, resp. rate 18, last menstrual period 01/10/2014.   Patient Vitals for the past 24 hrs:  BP Temp Temp src Pulse Resp  12/10/14 1422 143/85 mmHg - - 83 -  12/10/14 1412 143/84 mmHg - - 75 -  12/10/14 1402 148/80 mmHg - - 72 -  12/10/14 1352 140/76 mmHg - - 72 -  12/10/14 1342 151/82 mmHg - - 84 -  12/10/14 1332 145/90 mmHg - - 79 -  12/10/14 1330 153/89 mmHg 98.1 F (36.7 C) Oral 82 18  12/10/14 1328 153/89 mmHg - - 82 -      Physical Exam  Nursing note and vitals reviewed. Constitutional: She is oriented to person, place, and time. She appears well-developed and well-nourished. No distress.  HENT:  Head: Normocephalic and atraumatic.  Eyes: Conjunctivae are normal. Right eye exhibits no discharge. Left eye exhibits no discharge. No scleral icterus.  Neck: Normal range of motion.  Cardiovascular: Normal rate, regular rhythm and normal heart sounds.   No murmur heard. Respiratory: Effort normal and breath sounds normal. No respiratory distress. She has no wheezes.  GI: Soft. There is no tenderness.  Neurological: She is alert and oriented to person, place, and time. She has normal reflexes.  No clonus  Skin: Skin is warm and dry. She is not diaphoretic.  Psychiatric: She has a normal mood and affect. Her behavior is normal. Judgment and thought content normal.    MAU Course  Procedures Results for orders placed or performed during the hospital encounter of 12/10/14 (from the past 24 hour(s))  CBC     Status: Abnormal   Collection Time: 12/10/14  1:34 PM  Result Value Ref Range   WBC 7.0 4.0 - 10.5 K/uL   RBC 3.91 3.87 - 5.11 MIL/uL   Hemoglobin 11.9 (L) 12.0 - 15.0 g/dL   HCT 36.5 36.0 - 46.0 %   MCV 93.4 78.0 - 100.0 fL   MCH 30.4 26.0 - 34.0 pg   MCHC 32.6 30.0 - 36.0 g/dL   RDW 14.1 11.5 - 15.5 %   Platelets 242 150 - 400 K/uL  Comprehensive metabolic panel     Status: Abnormal   Collection Time: 12/10/14  1:34 PM  Result Value Ref Range   Sodium 139 135 - 145 mmol/L   Potassium 4.3 3.5 - 5.1 mmol/L   Chloride 107 101 - 111 mmol/L   CO2 27 22 - 32 mmol/L   Glucose, Bld 83 65 - 99 mg/dL   BUN 6 6 - 20 mg/dL   Creatinine, Ser 0.63 0.44 - 1.00 mg/dL   Calcium 9.2 8.9 - 10.3 mg/dL   Total Protein 6.4 (L) 6.5 - 8.1 g/dL   Albumin 2.5 (L) 3.5 - 5.0 g/dL   AST 19 15 - 41 U/L   ALT 14 14 - 54 U/L   Alkaline Phosphatase 178 (H) 38 - 126 U/L   Total Bilirubin 0.2 (L) 0.3 - 1.2 mg/dL   GFR  calc non Af Amer >60 >60 mL/min   GFR calc Af Amer >60 >60 mL/min   Anion gap 5 5 - 15  Lactate dehydrogenase     Status: None  Collection Time: 12/10/14  1:34 PM  Result Value Ref Range   LDH 162 98 - 192 U/L  Uric acid     Status: Abnormal   Collection Time: 12/10/14  1:34 PM  Result Value Ref Range   Uric Acid, Serum 6.8 (H) 2.3 - 6.6 mg/dL  Protein / creatinine ratio, urine     Status: Abnormal   Collection Time: 12/10/14  2:35 PM  Result Value Ref Range   Creatinine, Urine 120.00 mg/dL   Total Protein, Urine 75 mg/dL   Protein Creatinine Ratio 0.63 (H) 0.00 - 0.15 mg/mg[Cre]    MDM BPP 8/8 in MFM prior to arrival.  FHT 144 by doppler in MAU.  1530-Dr. Constant regarding labs results & BPs in MAU. Pt to be admitted to antenatal for observation & BMZ Assessment and Plan  A: 1. Gestational hypertension, third trimester   2. Chronic benign essential hypertension, antepartum   3. Pre-existing type 2 diabetes mellitus during pregnancy in first trimester ([place])    P: Admit to observation in anterpartum BMZ Monitors BPs Care turned over to Hansel Feinstein, CNM  Jorje Guild, NP  12/10/2014, 1:35 PM

## 2014-12-10 NOTE — Plan of Care (Signed)
Problem: Consults Goal: Antepartum Patient Education Outcome: Progressing Patient educated on signs and symptoms of preeclampsia and hypertension. Provided instruction on I&Os and importance of maintaining blood pressures below severe range. Patient expressed understanding and had no further questions.

## 2014-12-11 LAB — GLUCOSE, CAPILLARY
GLUCOSE-CAPILLARY: 99 mg/dL (ref 65–99)
GLUCOSE-CAPILLARY: 104 mg/dL — AB (ref 65–99)
GLUCOSE-CAPILLARY: 129 mg/dL — AB (ref 65–99)
Glucose-Capillary: 129 mg/dL — ABNORMAL HIGH (ref 65–99)

## 2014-12-11 LAB — PROTEIN, URINE, 24 HOUR
Collection Interval-UPROT: 24 hours
PROTEIN, 24H URINE: 1725 mg/d — AB (ref 50–100)
PROTEIN, URINE: 75 mg/dL
Urine Total Volume-UPROT: 2300 mL

## 2014-12-11 LAB — ABO/RH: ABO/RH(D): O POS

## 2014-12-11 LAB — HEPATITIS B SURFACE ANTIGEN: HEP B S AG: NEGATIVE

## 2014-12-11 NOTE — Progress Notes (Signed)
Patient ID: Kayla Flynn, female   DOB: 05/28/1990, 24 y.o.   MRN: [id number] FACULTY PRACTICE ANTEPARTUM(COMPREHENSIVE) NOTE  Kayla Flynn is a 24 y.o. G1P0 with Estimated Date of Delivery: 01/08/15    [redacted]w[redacted]d  who is admitted for hypertensive .    Fetal presentation is cephalic. Length of Stay:  1  Days  Date of admission:12/10/2014  Subjective: No headache or other complaints Patient reports the fetal movement as active. Patient reports uterine contraction  activity as none. Patient reports  vaginal bleeding as none. Patient describes fluid per vagina as None.  Vitals:  Blood pressure 157/100, pulse 91, temperature 98 F (36.7 C), temperature source Oral, resp. rate 18, height  (1.448 m), weight 378 lb (171.46 kg), last menstrual period 01/10/2014. Filed Vitals:   12/10/14 2306 12/10/14 2319 12/11/14 0118 12/11/14 0621  BP: 165/108 143/62 118/51 157/100  Pulse: 87 75 78 91  Temp:   98 F (36.7 C)   TempSrc:   Oral   Resp:  Height:      Weight:       Physical Examination:  General appearance - alert, well appearing, and in no distress Abdomen - soft, nontender, nondistended, no masses or organomegaly Neurological - alert, oriented, normal speech, no focal findings or movement disorder noted, DTR's normal and symmetric Fundal Height:  Difficult to assess due to obesity Pelvic Exam:   Cervical Exam:   Extremities:  with DTRs 2+ Membranes:intact  Fetal Monitoring:  Baseline: 140 bpm, Variability: Good {> 6 bpm), Accelerations: Reactive and Decelerations: Absent   reactive  Labs:  Results for orders placed or performed during the hospital encounter of 12/10/14 (from the past 24 hour(s))  CBC   Collection Time: 12/10/14  1:34 PM  Result Value Ref Range   WBC 7.0 4.0 - 10.5 K/uL   RBC 3.91 3.87 - 5.11 MIL/uL   Hemoglobin 11.9 (L) 12.0 - 15.0 g/dL   HCT 40.9 81.1 - 91.4 %   MCV 93.4 78.0 - 100.0 fL   MCH 30.4 26.0 - 34.0 pg   MCHC 32.6 30.0 - 36.0 g/dL   RDW 78.2  95.6 - 21.3 %   Platelets 242 150 - 400 K/uL  Comprehensive metabolic panel   Collection Time: 12/10/14  1:34 PM  Result Value Ref Range   Sodium 139 135 - 145 mmol/L   Potassium 4.3 3.5 - 5.1 mmol/L   Chloride 107 101 - 111 mmol/L   CO2 27 22 - 32 mmol/L   Glucose, Bld 83 65 - 99 mg/dL   BUN 6 6 - 20 mg/dL   Creatinine, Ser 0.86 0.44 - 1.00 mg/dL   Calcium 9.2 8.9 - 57.8 mg/dL   Total Protein 6.4 (L) 6.5 - 8.1 g/dL   Albumin 2.5 (L) 3.5 - 5.0 g/dL   AST 19 15 - 41 U/L   ALT 14 14 - 54 U/L   Alkaline Phosphatase 178 (H) 38 - 126 U/L   Total Bilirubin 0.2 (L) 0.3 - 1.2 mg/dL   GFR calc non Af Amer >60 >60 mL/min   GFR calc Af Amer >60 >60 mL/min   Anion gap 5 5 - 15  Lactate dehydrogenase   Collection Time: 12/10/14  1:34 PM  Result Value Ref Range   LDH 162 98 - 192 U/L  Uric acid   Collection Time: 12/10/14  1:34 PM  Result Value Ref Range   Uric Acid, Serum 6.8 (H) 2.3 - 6.6 mg/dL  Protein /  creatinine ratio, urine   Collection Time: 12/10/14  2:35 PM  Result Value Ref Range   Creatinine, Urine 120.00 mg/dL   Total Protein, Urine 75 mg/dL   Protein Creatinine Ratio 0.63 (H) 0.00 - 0.15 mg/mg[Cre]  Type and screen   Collection Time: 12/10/14  4:45 PM  Result Value Ref Range   ABO/RH(D) O POS    Antibody Screen NEG    Sample Expiration 12/13/2014   ABO/Rh   Collection Time: 12/10/14  4:45 PM  Result Value Ref Range   ABO/RH(D) O POS   Glucose, capillary   Collection Time: 12/10/14  5:46 PM  Result Value Ref Range   Glucose-Capillary 99 65 - 99 mg/dL   Comment 1 Notify RN    Comment 2 Document in Chart   Glucose, capillary   Collection Time: 12/10/14  8:57 PM  Result Value Ref Range   Glucose-Capillary 108 (H) 65 - 99 mg/dL  Glucose, capillary   Collection Time: 12/11/14  6:15 AM  Result Value Ref Range   Glucose-Capillary 99 65 - 99 mg/dL    Imaging Studies:      Medications:  Scheduled . betamethasone acetate-betamethasone sodium phosphate  12 mg  Intramuscular Once  . docusate sodium  100 mg Oral Daily  . labetalol  200 mg Oral BID  . prenatal multivitamin  1 tablet Oral Q1200   I have reviewed the patient's current medications.  ASSESSMENT: G1P0 [redacted]w[redacted]d Estimated Date of Delivery: 01/08/15  Patient Active Problem List   Diagnosis Date Noted  . Preeclampsia 12/10/2014  . Chronic benign essential hypertension, antepartum 11/26/2014  . Syphilis 08/11/2014  . Obesity during pregnancy in second trimester   . Encounter for fetal anatomic survey   . Chronic hypertension in obstetric context in second trimester   . Tobacco use disorder 07/21/2014  . Tobacco smoking affecting pregnancy in second trimester, antepartum 07/21/2014  . Maternal varicella, non-immune 07/21/2014  . 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 Supervision of high-risk pregnancy 07/14/2014  . Pre-existing type 2 diabetes mellitus during pregnancy in first trimester ([facility])   . Obesity, morbid, BMI 50 or higher ([facility]) 07/18/2012  . Extreme obesity ([facility]) 07/18/2012  . DM type 2 (diabetes mellitus, type 2) ([facility]) 10/12/2010    PLAN: Continue 24 hour urine collection, 2nd dose of betamethasone and BP monitoring and management   EURE,LUTHER H 12/11/2014,6:41 AM

## 2014-12-12 DIAGNOSIS — O1493 Unspecified pre-eclampsia, third trimester: Secondary | ICD-10-CM

## 2014-12-12 DIAGNOSIS — O139 Gestational [pregnancy-induced] hypertension without significant proteinuria, unspecified trimester: Secondary | ICD-10-CM | POA: Insufficient documentation

## 2014-12-12 LAB — GLUCOSE, CAPILLARY
GLUCOSE-CAPILLARY: 109 mg/dL — AB (ref 65–99)
Glucose-Capillary: 118 mg/dL — ABNORMAL HIGH (ref 65–99)
Glucose-Capillary: 125 mg/dL — ABNORMAL HIGH (ref 65–99)

## 2014-12-12 LAB — RUBELLA SCREEN: RUBELLA: 1.87 {index} (ref >0.99)

## 2014-12-12 MED ORDER — LABETALOL HCL 200 MG PO TABS: 200.0000 mg | ORAL_TABLET | Freq: Two times a day (BID) | ORAL | Status: DC

## 2014-12-12 NOTE — Discharge Instructions (Signed)

## 2014-12-12 NOTE — Discharge Summary (Signed)
OB Discharge Summary     Patient Name: Kayla Flynn DOB: 26-Oct-1990 MRN: [id number]  Date of admission: 12/10/2014 Delivering MD: This patient has no babies on file.  Date of discharge: 12/12/2014  Admitting diagnosis: 35WKS, ELEVATED BP Intrauterine pregnancy: [redacted]w[redacted]d     Secondary diagnosis: Chronic hypertension, Class B DM     Discharge diagnosis: Preeclampsia (mild) superimposed on CHTN, class B DM                                                                                              Hospital course:  This is a G1 at 36 weeks and 1 day who was sent to maternity admissions from maternal-fetal medicine and for elevated blood pressures. The patient was admitted for preeclampsia workup. Patient received 2 doses of betamethasone and was started on labetalol for her elevated blood pressures. A 24-hour urine was collected which revealed 1700 mg of protein. Her baseline proteinuria was 140 mg. Patient's blood pressures have remained in the mildly elevated range. Fetal heart rate has been reactive the entire time the patient has been in the hospital. Patient is being discharged to follow-up as an outpatient with likely induction date around 37 weeks.  Physical exam  Filed Vitals:   12/11/14 2230 12/11/14 2303 12/12/14 0001 12/12/14 0401  BP:   151/88 141/89  Pulse:   90 89  Temp:   98 F (36.7 C)   TempSrc:      Resp: Height:      Weight:      SpO2:    97%   General: alert, cooperative and no distress  Heart: regular rate, no murmur Lungs: clear to auscultation bilaterally, no wheezing.  Abdomen: Obese. Soft, nontender, nondistended. Fundal height difficult to assess due to morbid obesity. DVT Evaluation: No evidence of DVT seen on physical exam.  Labs: Lab Results  Component Value Date   WBC 7.0 12/10/2014   HGB 11.9* 12/10/2014   HCT 36.5 12/10/2014   MCV 93.4 12/10/2014   PLT 242 12/10/2014   CMP Latest Ref Rng 12/10/2014  Glucose 65 - 99 mg/dL 83  BUN 6  - 20 mg/dL 6  Creatinine 4.09 - 8.11 mg/dL 9.14  Sodium 782 - 956 mmol/L 139  Potassium 3.5 - 5.1 mmol/L 4.3  Chloride 101 - 111 mmol/L 107  CO2 22 - 32 mmol/L 27  Calcium 8.9 - 10.3 mg/dL 9.2  Total Protein 6.5 - 8.1 g/dL 6.4(L)  Total Bilirubin 0.3 - 1.2 mg/dL 2.1(H)  Alkaline Phos 38 - 126 U/L 178(H)  AST 15 - 41 U/L 19  ALT 14 - 54 U/L 14    Discharge instruction: Preeclampsia information given as well as instructions of when to follow-up particularly with severe persistent headache, scotoma, contractions, ruptured membranes, decreased fetal activity  Medications:  Current facility-administered medications:  .  acetaminophen (TYLENOL) tablet 650 mg, 650 mg, Oral, Q4H PRN, Aviva Signs, CNM, 650 mg at 12/11/14 2014 .  calcium carbonate (TUMS - dosed in mg elemental calcium) chewable tablet 400 mg of elemental calcium, 2 tablet, Oral, Q4H PRN, Hilda Lias  Lonia Farber, CNM, 400 mg of elemental calcium at 12/12/14 0018 .  docusate sodium (COLACE) capsule 100 mg, 100 mg, Oral, Daily, Aviva Signs, CNM, 100 mg at 12/11/14 1057 .  hydrALAZINE (APRESOLINE) injection 10 mg, 10 mg, Intravenous, Once PRN, Peggy Constant, MD .  hydrALAZINE (APRESOLINE) injection 5-10 mg, 5-10 mg, Intravenous, Q20 Min PRN, Peggy Constant, MD .  labetalol (NORMODYNE) tablet 200 mg, 200 mg, Oral, BID, Peggy Constant, MD, 200 mg at 12/11/14 2157 .  labetalol (NORMODYNE,TRANDATE) injection 20-80 mg, 20-80 mg, Intravenous, Q10 min PRN, Peggy Constant, MD, 20 mg at 12/11/14 1635 .  lactated ringers infusion, , Intravenous, Continuous, Peggy Constant, MD, Last Rate: 125 mL/hr at 12/12/14 0603 .  prenatal multivitamin tablet 1 tablet, 1 tablet, Oral, Q1200, Aviva Signs, CNM, 1 tablet at 12/11/14 1222 .  zolpidem (AMBIEN) tablet 5 mg, 5 mg, Oral, QHS PRN, Aviva Signs, CNM  Diet: routine diet  Activity: Advance as tolerated. Pelvic rest for 6 weeks.   Outpatient follow up: Monday  Greater than 30 minutes  spent in discharge of this patient.   12/12/2014 Pradeep Beaubrun JEHIEL, DO

## 2014-12-14 ENCOUNTER — Inpatient Hospital Stay (HOSPITAL_COMMUNITY)
Admission: AD | Admit: 2014-12-14 | Discharge: 2014-12-21 | DRG: 765 | Disposition: A | Discharge status: Home / Self Care | Payer: Medicaid Other | Source: Ambulatory Visit | Attending: Obstetrics & Gynecology | Admitting: Obstetrics & Gynecology

## 2014-12-14 ENCOUNTER — Encounter (HOSPITAL_COMMUNITY): Payer: Self-pay | Admitting: *Deleted

## 2014-12-14 DIAGNOSIS — O1414 Severe pre-eclampsia complicating childbirth: Principal | ICD-10-CM | POA: Diagnosis present

## 2014-12-14 DIAGNOSIS — O99214 Obesity complicating childbirth: Secondary | ICD-10-CM | POA: Diagnosis present

## 2014-12-14 DIAGNOSIS — Z7982 Long term (current) use of aspirin: Secondary | ICD-10-CM

## 2014-12-14 DIAGNOSIS — O99322 Drug use complicating pregnancy, second trimester: Secondary | ICD-10-CM | POA: Diagnosis present

## 2014-12-14 DIAGNOSIS — O1490 Unspecified pre-eclampsia, unspecified trimester: Secondary | ICD-10-CM

## 2014-12-14 DIAGNOSIS — Z6841 Body Mass Index (BMI) 40.0 and over, adult: Secondary | ICD-10-CM

## 2014-12-14 DIAGNOSIS — Z3A37 37 weeks gestation of pregnancy: Secondary | ICD-10-CM

## 2014-12-14 DIAGNOSIS — O1410 Severe pre-eclampsia, unspecified trimester: Secondary | ICD-10-CM | POA: Diagnosis present

## 2014-12-14 DIAGNOSIS — O169 Unspecified maternal hypertension, unspecified trimester: Secondary | ICD-10-CM | POA: Diagnosis present

## 2014-12-14 DIAGNOSIS — O119 Pre-existing hypertension with pre-eclampsia, unspecified trimester: Secondary | ICD-10-CM | POA: Diagnosis present

## 2014-12-14 DIAGNOSIS — F17210 Nicotine dependence, cigarettes, uncomplicated: Secondary | ICD-10-CM | POA: Diagnosis present

## 2014-12-14 DIAGNOSIS — Z2839 Other underimmunization status: Secondary | ICD-10-CM | POA: Diagnosis present

## 2014-12-14 DIAGNOSIS — Z98891 History of uterine scar from previous surgery: Secondary | ICD-10-CM

## 2014-12-14 DIAGNOSIS — Z7984 Long term (current) use of oral hypoglycemic drugs: Secondary | ICD-10-CM

## 2014-12-14 DIAGNOSIS — Z79899 Other long term (current) drug therapy: Secondary | ICD-10-CM

## 2014-12-14 DIAGNOSIS — O99334 Smoking (tobacco) complicating childbirth: Secondary | ICD-10-CM | POA: Diagnosis present

## 2014-12-14 DIAGNOSIS — Z283 Underimmunization status: Secondary | ICD-10-CM

## 2014-12-14 DIAGNOSIS — O24420 Gestational diabetes mellitus in childbirth, diet controlled: Secondary | ICD-10-CM | POA: Diagnosis present

## 2014-12-14 DIAGNOSIS — O09899 Supervision of other high risk pregnancies, unspecified trimester: Secondary | ICD-10-CM | POA: Diagnosis present

## 2014-12-14 DIAGNOSIS — O10019 Pre-existing essential hypertension complicating pregnancy, unspecified trimester: Secondary | ICD-10-CM

## 2014-12-14 DIAGNOSIS — O24111 Pre-existing diabetes mellitus, type 2, in pregnancy, first trimester: Secondary | ICD-10-CM | POA: Diagnosis present

## 2014-12-14 DIAGNOSIS — O99332 Smoking (tobacco) complicating pregnancy, second trimester: Secondary | ICD-10-CM | POA: Diagnosis present

## 2014-12-14 NOTE — MAU Note (Signed)
Pt c/o HA that started at 1830-on and off-took BP at home and was 185/112. Took BP medication. Habing some n/v-has vomited 2 times today. Denies vag bleeding or LOF. Having some cramping.

## 2014-12-15 ENCOUNTER — Observation Stay (HOSPITAL_COMMUNITY): Payer: Medicaid Other

## 2014-12-15 ENCOUNTER — Encounter (HOSPITAL_COMMUNITY): Payer: Self-pay | Admitting: *Deleted

## 2014-12-15 ENCOUNTER — Encounter: Payer: Medicaid Other | Admitting: Obstetrics & Gynecology

## 2014-12-15 DIAGNOSIS — Z7982 Long term (current) use of aspirin: Secondary | ICD-10-CM | POA: Diagnosis not present

## 2014-12-15 DIAGNOSIS — Z3A37 37 weeks gestation of pregnancy: Secondary | ICD-10-CM | POA: Diagnosis not present

## 2014-12-15 DIAGNOSIS — O1493 Unspecified pre-eclampsia, third trimester: Secondary | ICD-10-CM | POA: Diagnosis not present

## 2014-12-15 DIAGNOSIS — O24420 Gestational diabetes mellitus in childbirth, diet controlled: Secondary | ICD-10-CM | POA: Diagnosis present

## 2014-12-15 DIAGNOSIS — O24113 Pre-existing diabetes mellitus, type 2, in pregnancy, third trimester: Secondary | ICD-10-CM | POA: Diagnosis not present

## 2014-12-15 DIAGNOSIS — O119 Pre-existing hypertension with pre-eclampsia, unspecified trimester: Secondary | ICD-10-CM | POA: Diagnosis present

## 2014-12-15 DIAGNOSIS — R51 Headache: Secondary | ICD-10-CM | POA: Diagnosis present

## 2014-12-15 DIAGNOSIS — Z3A36 36 weeks gestation of pregnancy: Secondary | ICD-10-CM

## 2014-12-15 DIAGNOSIS — O2412 Pre-existing diabetes mellitus, type 2, in childbirth: Secondary | ICD-10-CM | POA: Diagnosis not present

## 2014-12-15 DIAGNOSIS — O99334 Smoking (tobacco) complicating childbirth: Secondary | ICD-10-CM | POA: Diagnosis present

## 2014-12-15 DIAGNOSIS — O99214 Obesity complicating childbirth: Secondary | ICD-10-CM | POA: Diagnosis present

## 2014-12-15 DIAGNOSIS — Z6841 Body Mass Index (BMI) 40.0 and over, adult: Secondary | ICD-10-CM | POA: Diagnosis not present

## 2014-12-15 DIAGNOSIS — E119 Type 2 diabetes mellitus without complications: Secondary | ICD-10-CM | POA: Diagnosis not present

## 2014-12-15 DIAGNOSIS — Z79899 Other long term (current) drug therapy: Secondary | ICD-10-CM | POA: Diagnosis not present

## 2014-12-15 DIAGNOSIS — F17210 Nicotine dependence, cigarettes, uncomplicated: Secondary | ICD-10-CM | POA: Diagnosis present

## 2014-12-15 DIAGNOSIS — O169 Unspecified maternal hypertension, unspecified trimester: Secondary | ICD-10-CM | POA: Diagnosis present

## 2014-12-15 DIAGNOSIS — Z7984 Long term (current) use of oral hypoglycemic drugs: Secondary | ICD-10-CM | POA: Diagnosis not present

## 2014-12-15 DIAGNOSIS — O1414 Severe pre-eclampsia complicating childbirth: Secondary | ICD-10-CM | POA: Diagnosis present

## 2014-12-15 LAB — COMPREHENSIVE METABOLIC PANEL
ALBUMIN: 2.8 g/dL — AB (ref 3.5–5.0)
ALK PHOS: 179 U/L — AB (ref 38–126)
ALT: 23 U/L (ref 14–54)
ANION GAP: 8 (ref 5–15)
AST: 29 U/L (ref 15–41)
BILIRUBIN TOTAL: 0.7 mg/dL (ref 0.3–1.2)
BUN: 6 mg/dL (ref 6–20)
CO2: 23 mmol/L (ref 22–32)
Calcium: 8.8 mg/dL — ABNORMAL LOW (ref 8.9–10.3)
Chloride: 106 mmol/L (ref 101–111)
Creatinine, Ser: 0.59 mg/dL (ref 0.44–1.00)
GFR calc Af Amer: >60 mL/min (ref >60)
GFR calc non Af Amer: >60 mL/min (ref >60)
GLUCOSE: 78 mg/dL (ref 65–99)
POTASSIUM: 4 mmol/L (ref 3.5–5.1)
SODIUM: 137 mmol/L (ref 135–145)
Total Protein: 6.2 g/dL — ABNORMAL LOW (ref 6.5–8.1)

## 2014-12-15 LAB — GROUP B STREP BY PCR: Group B strep by PCR: NEGATIVE

## 2014-12-15 LAB — CBC WITH DIFFERENTIAL/PLATELET
Basophils Absolute: 0 10*3/uL (ref 0.0–0.1)
Basophils Relative: 0 %
EOS PCT: 1 %
Eosinophils Absolute: 0.1 10*3/uL (ref 0.0–0.7)
HEMATOCRIT: 34.7 % — AB (ref 36.0–46.0)
Hemoglobin: 11.3 g/dL — ABNORMAL LOW (ref 12.0–15.0)
LYMPHS PCT: 19 %
Lymphs Abs: 1.9 10*3/uL (ref 0.7–4.0)
MCH: 30.5 pg (ref 26.0–34.0)
MCHC: 32.6 g/dL (ref 30.0–36.0)
MCV: 93.8 fL (ref 78.0–100.0)
MONO ABS: 1.3 10*3/uL — AB (ref 0.1–1.0)
MONOS PCT: 13 %
NEUTROS ABS: 6.8 10*3/uL (ref 1.7–7.7)
Neutrophils Relative %: 68 %
PLATELETS: 292 10*3/uL (ref 150–400)
RBC: 3.7 MIL/uL — ABNORMAL LOW (ref 3.87–5.11)
RDW: 14 % (ref 11.5–15.5)
WBC: 10 10*3/uL (ref 4.0–10.5)

## 2014-12-15 LAB — URINALYSIS, ROUTINE W REFLEX MICROSCOPIC
Bilirubin Urine: NEGATIVE
GLUCOSE, UA: NEGATIVE mg/dL
HGB URINE DIPSTICK: NEGATIVE
Ketones, ur: NEGATIVE mg/dL
LEUKOCYTES UA: NEGATIVE
Nitrite: NEGATIVE
PROTEIN: NEGATIVE mg/dL
Specific Gravity, Urine: 1.01 (ref 1.005–1.030)
Urobilinogen, UA: 0.2 mg/dL (ref 0.0–1.0)
pH: 6.5 (ref 5.0–8.0)

## 2014-12-15 LAB — TYPE AND SCREEN
ABO/RH(D): O POS
Antibody Screen: NEGATIVE

## 2014-12-15 LAB — LACTATE DEHYDROGENASE: LDH: 264 U/L — AB (ref 98–192)

## 2014-12-15 LAB — RPR: RPR Ser Ql: NONREACTIVE

## 2014-12-15 LAB — GLUCOSE, CAPILLARY: GLUCOSE-CAPILLARY: 91 mg/dL (ref 65–99)

## 2014-12-15 LAB — OB RESULTS CONSOLE GBS: STREP GROUP B AG: NEGATIVE

## 2014-12-15 LAB — URIC ACID: Uric Acid, Serum: 7.8 mg/dL — ABNORMAL HIGH (ref 2.3–6.6)

## 2014-12-15 MED ORDER — DOCUSATE SODIUM 100 MG PO CAPS
100.0000 mg | ORAL_CAPSULE | Freq: Every day | ORAL | Status: DC
  Filled 2014-12-15: qty 1

## 2014-12-15 MED ORDER — ONDANSETRON HCL 4 MG/2ML IJ SOLN
4.0000 mg | Freq: Four times a day (QID) | INTRAMUSCULAR | Status: DC | PRN
  Administered 2014-12-16 – 2014-12-18 (×3): 4 mg via INTRAVENOUS
  Filled 2014-12-15 (×3): qty 2

## 2014-12-15 MED ORDER — TERBUTALINE SULFATE 1 MG/ML IJ SOLN: 0.2500 mg | Freq: Once | INTRAMUSCULAR | Status: DC | PRN

## 2014-12-15 MED ORDER — OXYCODONE-ACETAMINOPHEN 5-325 MG PO TABS: 2.0000 | ORAL_TABLET | ORAL | Status: DC | PRN

## 2014-12-15 MED ORDER — SODIUM CHLORIDE 0.9 % IV SOLN
2.0000 g | Freq: Once | INTRAVENOUS | Status: AC
  Administered 2014-12-15: 2 g via INTRAVENOUS
  Filled 2014-12-15: qty 2000

## 2014-12-15 MED ORDER — ACETAMINOPHEN 325 MG PO TABS: 650.0000 mg | ORAL_TABLET | ORAL | Status: DC | PRN

## 2014-12-15 MED ORDER — MISOPROSTOL 50MCG HALF TABLET
50.0000 ug | ORAL_TABLET | ORAL | Status: DC
  Administered 2014-12-15 (×3): 50 ug via ORAL
  Filled 2014-12-15 (×3): qty 0.5

## 2014-12-15 MED ORDER — CALCIUM CARBONATE ANTACID 500 MG PO CHEW
2.0000 | CHEWABLE_TABLET | ORAL | Status: DC | PRN
  Filled 2014-12-15: qty 2

## 2014-12-15 MED ORDER — LABETALOL HCL 5 MG/ML IV SOLN
20.0000 mg | INTRAVENOUS | Status: DC | PRN
  Filled 2014-12-15: qty 4

## 2014-12-15 MED ORDER — LACTATED RINGERS IV SOLN
500.0000 mL | INTRAVENOUS | Status: DC | PRN
  Administered 2014-12-16: 250 mL via INTRAVENOUS
  Administered 2014-12-18: 500 mL via INTRAVENOUS

## 2014-12-15 MED ORDER — MISOPROSTOL 25 MCG QUARTER TABLET
25.0000 ug | ORAL_TABLET | ORAL | Status: DC
  Administered 2014-12-15: 25 ug via VAGINAL
  Filled 2014-12-15 (×2): qty 0.25

## 2014-12-15 MED ORDER — MAGNESIUM SULFATE BOLUS VIA INFUSION
4.0000 g | Freq: Once | INTRAVENOUS | Status: AC
  Administered 2014-12-15: 4 g via INTRAVENOUS
  Filled 2014-12-15: qty 500

## 2014-12-15 MED ORDER — LIDOCAINE HCL (PF) 1 % IJ SOLN: 30.0000 mL | INTRAMUSCULAR | Status: DC | PRN

## 2014-12-15 MED ORDER — LACTATED RINGERS IV SOLN
2.0000 g/h | INTRAVENOUS | Status: DC
  Administered 2014-12-15 – 2014-12-17 (×4): 2 g/h via INTRAVENOUS
  Filled 2014-12-15 (×4): qty 80

## 2014-12-15 MED ORDER — OXYTOCIN 40 UNITS IN LACTATED RINGERS INFUSION - SIMPLE MED
62.5000 mL/h | INTRAVENOUS | Status: DC
Start: 2014-12-15 — End: 2014-12-18

## 2014-12-15 MED ORDER — HYDRALAZINE HCL 20 MG/ML IJ SOLN
10.0000 mg | INTRAMUSCULAR | Status: DC | PRN
  Administered 2014-12-16: 10 mg via INTRAVENOUS
  Administered 2014-12-18: 20 mg via INTRAVENOUS
  Filled 2014-12-15 (×2): qty 1

## 2014-12-15 MED ORDER — OXYCODONE-ACETAMINOPHEN 5-325 MG PO TABS: 1.0000 | ORAL_TABLET | ORAL | Status: DC | PRN

## 2014-12-15 MED ORDER — OXYTOCIN BOLUS FROM INFUSION: 500.0000 mL | INTRAVENOUS | Status: DC

## 2014-12-15 MED ORDER — LACTATED RINGERS IV SOLN
INTRAVENOUS | Status: DC
  Administered 2014-12-16 – 2014-12-17 (×2): via INTRAVENOUS

## 2014-12-15 MED ORDER — LABETALOL HCL 200 MG PO TABS: 200.0000 mg | ORAL_TABLET | Freq: Two times a day (BID) | ORAL | Status: DC

## 2014-12-15 MED ORDER — HYDRALAZINE HCL 20 MG/ML IJ SOLN
5.0000 mg | INTRAMUSCULAR | Status: AC | PRN
  Administered 2014-12-18 (×2): 10 mg via INTRAVENOUS

## 2014-12-15 MED ORDER — PRENATAL MULTIVITAMIN CH: 1.0000 | ORAL_TABLET | Freq: Every day | ORAL | Status: DC

## 2014-12-15 MED ORDER — METFORMIN HCL 500 MG PO TABS
500.0000 mg | ORAL_TABLET | Freq: Two times a day (BID) | ORAL | Status: DC
  Administered 2014-12-15: 500 mg via ORAL
  Filled 2014-12-15 (×2): qty 1

## 2014-12-15 MED ORDER — LACTATED RINGERS IV SOLN
INTRAVENOUS | Status: DC
  Administered 2014-12-15 – 2014-12-18 (×5): via INTRAVENOUS

## 2014-12-15 MED ORDER — LABETALOL HCL 200 MG PO TABS
400.0000 mg | ORAL_TABLET | Freq: Two times a day (BID) | ORAL | Status: DC
  Administered 2014-12-15 – 2014-12-21 (×11): 400 mg via ORAL
  Filled 2014-12-15 (×14): qty 2

## 2014-12-15 MED ORDER — ZOLPIDEM TARTRATE 5 MG PO TABS: 5.0000 mg | ORAL_TABLET | Freq: Every evening | ORAL | Status: DC | PRN

## 2014-12-15 MED ORDER — HYDRALAZINE HCL 20 MG/ML IJ SOLN
10.0000 mg | Freq: Once | INTRAMUSCULAR | Status: AC
  Administered 2014-12-15: 10 mg via INTRAVENOUS
  Filled 2014-12-15: qty 1

## 2014-12-15 MED ORDER — HYDRALAZINE HCL 20 MG/ML IJ SOLN
10.0000 mg | Freq: Once | INTRAMUSCULAR | Status: AC
  Administered 2014-12-15: 01:00:00 via INTRAMUSCULAR

## 2014-12-15 MED ORDER — CITRIC ACID-SODIUM CITRATE 334-500 MG/5ML PO SOLN
30.0000 mL | ORAL | Status: DC | PRN
  Administered 2014-12-17 – 2014-12-18 (×2): 30 mL via ORAL
  Filled 2014-12-15 (×2): qty 15

## 2014-12-15 NOTE — Progress Notes (Signed)
Kayla Flynn is a 24 y.o. G1P0 at [redacted]w[redacted]d admitted for induction of labor due to preeclampsia/GDM.  Subjective: Pt reports feeling cramping/contractions that are irregular, denies need for pain medication.  Family in room for support.  Objective: BP 144/65 mmHg  Pulse 80  Temp(Src) 98.4 F (36.9 C) (Oral)  Resp 18  Ht  (1.473 m)  Wt 171.913 kg (379 lb)  BMI 79.23 kg/m2  SpO2 97%  LMP 01/10/2014 (Approximate) I/O last 3 completed shifts: In: 5815 [P.O.:3340; I.V.:2425; IV Piggyback:50] Out: 3825 [Urine:3825]    FHT:  FHR: 145 bpm, variability: moderate,  accelerations:  Present,  decelerations:  Absent UC:   irregular, every 5-15 minutes SVE:   1/50/-3, vertex by Sharen Counter, CNM Pt positioned in stirrups prior to digital exam.  Foley bulb placed without difficulty.  Pt tolerated well.   Labs: Lab Results  Component Value Date   WBC 10.0 12/15/2014   HGB 11.3* 12/15/2014   HCT 34.7* 12/15/2014   MCV 93.8 12/15/2014   PLT 292 12/15/2014    Assessment / Plan: Induction of labor due to preeclampsia  Labor: Progressing normally Preeclampsia:  on magnesium sulfate Fetal Wellbeing:  Category I Pain Control:  Labor support without medications I/D:  n/a Anticipated MOD:  NSVD  LEFTWICH-KIRBY, Shakti Fleer 12/15/2014, 11:46 PM

## 2014-12-15 NOTE — Progress Notes (Signed)
Consulted with Dr. Penne Lash and she was in to evaluate pt. Labs reviewed. Given the fact pt is now symptomatic and severe range BP's will induce and MGSO4 therapy. PO discussed with pt.

## 2014-12-15 NOTE — Progress Notes (Signed)
Labor Progress Note  Kayla Flynn is a 24 y.o. G1P0 at [redacted]w[redacted]d  admitted for induction of labor due to Hypertension. She is receiving labetolol, magnesium and hydralazine prn.   S: Patient has been able to rest today. Denies headache. Endorses feeling "menstruation" like cramps but denies contractions.   O:  BP 144/65 mmHg  Pulse 80  Temp(Src) 98.4 F (36.9 C) (Oral)  Resp 18  Ht  (1.473 m)  Wt 171.913 kg (379 lb)  BMI 79.23 kg/m2  SpO2 97%  LMP 01/10/2014 (Approximate)  Total I/O In: 3875 [P.O.:2475; I.V.:1400] Out: 2575 [Urine:2575]  FHT:  FHR: 145 bpm, variability: moderate,  accelerations:  Present,  decelerations:  Absent UC:   Unable to monitor due to habitus SVE:   Dilation: Fingertip Effacement (%): 50 Station: -2 Exam by:: Lawson Fiscal, CNM SROM/AROM: Intact  Cytotec x4 (3 PO, 1 vaginally)  Labs: Lab Results  Component Value Date   WBC 10.0 12/15/2014   HGB 11.3* 12/15/2014   HCT 34.7* 12/15/2014   MCV 93.8 12/15/2014   PLT 292 12/15/2014    Assessment / Plan: 24 y.o. G1P0 [redacted]w[redacted]d in early/active labor Induction of labor due to preeclampsia and gestational hypertension,  progressing slowly on cytotec.  Labor: Repeat cytotec. Plan to place FB as able. Fetal Wellbeing:  Category I Pain Control:  Not currently receiving pain medication Anticipated MOD:  NSVD  Expectant management  Dani Gobble, MD [facility], PGY-1

## 2014-12-15 NOTE — Plan of Care (Signed)
Problem: Consults Goal: Birthing Suites Patient Information Press F2 to bring up selections list  Outcome: Completed/Met Date Met:  12/15/14  Pt < [redacted] weeks EGA, Inpatient induction and Other (specify with a note), hypertension affecting pregnancy

## 2014-12-15 NOTE — H&P (Signed)
Chief Complaint:  Morning Sickness; Emesis During Pregnancy; Diarrhea; and Abdominal Cramping  HPI: Kayla Flynn is a 24 y.o. G1P0 at 43w4dwho presents to maternity admissions reporting HA, nausea, Vomiting, and diarrhea. Sxs have been ongoing for the extent of the day today. She denies any fever or sick contacts. She denies any significant abdominal pain (only the discomfort of the nausea). [facility] She was prescribed labetalol a few days ago and endorses good compliance w/ this medication. Denies contractions, leakage of fluid or vaginal bleeding. Good fetal movement.   Pregnancy Course:  [facility]  Dating  9w [place] type:   O pos  Genetic Screen 1 Screen: normal Antibody: neg  Anatomic [place] Nml @ 18 wks; limited views of heart, spine > repeat Rubella:  Immune  GTT Early: 101, 162, 132, 151 RPR: NON REAC (06/13 1206) NR  Flu vaccine 8/22 HBsAg:   Neg  TDaP vaccine   8/22                            HIV: Non-reactive (04/07 0000)   GBS                                       GBS:   Contraception  undecided Pap:WNL  Baby Food breast   Circumcision    Pediatrician    Support Person  aLendon Colonel    Past Medical History: Past Medical History  Diagnosis Date  . Hypertension   . Obesity   . Prediabetes     "prediabetic; controlled w/diet" (07/17/2012)  . Acute appendicitis 07/18/2012  . Obesity, morbid, BMI 50 or higher ([place] 07/18/2012  . GDM (gestational diabetes mellitus)     Past obstetric history: OB History  Gravida Para Term Preterm AB SAB TAB Ectopic Multiple Living  1             # Outcome Date GA Lbr Len/2nd Weight Sex Delivery Anes PTL Lv  1 Current               Past Surgical History: Past Surgical History  Procedure Laterality Date  . Appendectomy  07/17/2012  . Laparoscopic appendectomy N/A 07/17/2012    Procedure: APPENDECTOMY LAPAROSCOPIC;  Surgeon: BZenovia Jarred MD;  [facility];  Service: General;   Laterality: N/A;     Family History: Family History  Problem Relation Age of Onset  . Hypertension Mother   . Diabetes Mother   . Hypertension Maternal Grandmother     Social History: Social History  Substance Use Topics  . Smoking status: Current Every Day Smoker -- 0.25 packs/day for 1.2 years    Types: Cigarettes  . Smokeless tobacco: Never Used  . Alcohol Use: No     Comment: last time couple wks ago but not since finding out was pregnant    Allergies: No Known Allergies  Meds:  Prescriptions prior to admission  Medication Sig Dispense Refill Last Dose  . ACCU-CHEK FASTCLIX LANCETS MISC Inject 1 each into the skin 4 (four) times daily. New DX GDM for testing 4 times daily DX O24.419 102 each 12   . acetaminophen (TYLENOL) 500 MG tablet Take 1,000 mg by mouth every 6 (six) hours as needed for mild pain.   12/09/2014 at Unknown time  . aspirin EC  81 MG tablet Take 1 tablet (81 mg total) by mouth daily. 30 tablet 11 12/09/2014 at Unknown time  . Blood Glucose Monitoring Suppl (ACCU-CHEK AVIVA CONNECT) W/DEVICE KIT 1 each by Does not apply route 4 (four) times daily. DX O24.419 GDM for testing 4 times daily     . Blood Glucose Monitoring Suppl (ACCU-CHEK AVIVA CONNECT) W/DEVICE KIT 1 each by Does not apply route 4 (four) times daily. 1 kit 0   . Butalbital-APAP-Caffeine 50-325-40 MG per capsule Take 1-2 capsules by mouth every 6 (six) hours as needed for headache. 6 capsule 0 Past Month at Unknown time  . calcium carbonate (TUMS - DOSED IN MG ELEMENTAL CALCIUM) 500 MG chewable tablet Chew 2 tablets by mouth as needed for indigestion or heartburn.    12/09/2014 at Unknown time  . glucose blood (ACCU-CHEK AVIVA) test strip DX O24.419 GDM for testing 4 times daily 100 each 12   . glucose blood (ACCU-CHEK SMARTVIEW) test strip New DX GDM for testing 4 times daily DX O24.419 100 each 12 11/25/2014 at Unknown time  . labetalol (NORMODYNE) 200 MG tablet Take 1 tablet (200 mg total) by mouth 2  (two) times daily. 28 tablet 3   . metFORMIN (GLUCOPHAGE) 500 MG tablet Take 1 tablet (500 mg total) by mouth 2 (two) times daily with a meal. 60 tablet 3 12/09/2014 at Unknown time  . Prenatal Vit-Fe Fumarate-FA (PRENATAL COMPLETE) 14-0.4 MG TABS Take 1 tablet by mouth daily. 30 each 6 12/09/2014 at Unknown time    ROS: Pertinent findings in history of present illness.  Physical Exam  Blood pressure 186/107, pulse 79, temperature 98.7 F (37.1 C), temperature source Oral, resp. rate 20, height 4' 10"  (1.473 m), weight 171.913 kg (379 lb), last menstrual period 01/10/2014, SpO2 99 %. GENERAL: Well-developed, well-nourished female in no acute distress.  HEENT: normocephalic HEART: normal rate RESP: normal effort ABDOMEN: Soft, morbidly obese, large pannus, non-tender, Uterine size difficult to assess 2/2 body habitus EXTREMITIES: Nontender, no edema NEURO: alert and oriented, speaking in full, clear sentences.    FHT:  Baseline 140 , moderate variability, accelerations present, no decelerations Contractions: absent   Labs: No results found for this or any previous visit (from the past 24 hour(s)).  Imaging:  [place] Mfm Fetal Bpp Wo Non Stress  12/10/2014   OBSTETRICAL ULTRASOUND: This exam was performed within a [place] [facility] Department. The OB US report was generated in the AS system, and faxed to the ordering physician.   This report is available in the [facility]. See the AS Obstetric US report via the Image Link.  [place] Mfm Fetal Bpp Wo Non Stress  11/26/2014   OBSTETRICAL ULTRASOUND: This exam was performed within a [place] [facility] Department. The OB US report was generated in the AS system, and faxed to the ordering physician.   This report is available in the [facility]. See the AS Obstetric US report via the Image Link.  [place] Mfm Ob Follow Up  12/10/2014   OBSTETRICAL ULTRASOUND: This exam was performed within a  Ultrasound Department. The OB US report was  generated in the AS system, and faxed to the ordering physician.   This report is available in the [facility]. See the AS Obstetric US report via the Image Link.   Assessment: 1. Chronic benign essential hypertension, antepartum   2. Tobacco smoking affecting pregnancy in second trimester, antepartum   3. 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, antepartum  4. Pre-existing type 2 diabetes mellitus during pregnancy in first trimester ([place])   Symptoms and BPs recorded are concerning for pre-eclampsia superimposed on chronic HTN.   Plan: Admit to Antenatal unit for BP observation.   - IOL may be warranted if HTN is unable to be controlled (plan as last admission was to induct ~37wks)  - Hydralazine for BP control  - Continue Labetalol Type 2 DM  - continue Metformin >> strong consideration to change to SSI if CBGs are elevated  - watch CBGs  - Carb Modified Diet    Medication List    ASK your doctor about these medications        ACCU-CHEK AVIVA CONNECT W/DEVICE Kit  1 each by Does not apply route 4 (four) times daily. DX O24.419 GDM for testing 4 times daily     ACCU-CHEK AVIVA CONNECT W/DEVICE Kit  1 each by Does not apply route 4 (four) times daily.     ACCU-CHEK FASTCLIX LANCETS Misc  Inject 1 each into the skin 4 (four) times daily. New DX GDM for testing 4 times daily DX O24.419     acetaminophen 500 MG tablet  Commonly known as:  TYLENOL  Take 1,000 mg by mouth every 6 (six) hours as needed for mild pain.     aspirin EC 81 MG tablet  Take 1 tablet (81 mg total) by mouth daily.     Butalbital-APAP-Caffeine 50-325-40 MG capsule  Take 1-2 capsules by mouth every 6 (six) hours as needed for headache.     calcium carbonate 500 MG chewable tablet  Commonly known as:  TUMS - dosed in mg elemental calcium  Chew 2 tablets by mouth as needed for indigestion or heartburn.     glucose blood test strip  Commonly known as:  ACCU-CHEK SMARTVIEW  New DX GDM for  testing 4 times daily DX O24.419     glucose blood test strip  Commonly known as:  ACCU-CHEK AVIVA  DX O24.419 GDM for testing 4 times daily     labetalol 200 MG tablet  Commonly known as:  NORMODYNE  Take 1 tablet (200 mg total) by mouth 2 (two) times daily.     metFORMIN 500 MG tablet  Commonly known as:  GLUCOPHAGE  Take 1 tablet (500 mg total) by mouth 2 (two) times daily with a meal.     PRENATAL COMPLETE 14-0.4 MG Tabs  Take 1 tablet by mouth daily.        Elberta Leatherwood, MD 12/15/2014 12:26 AM

## 2014-12-16 ENCOUNTER — Inpatient Hospital Stay (HOSPITAL_COMMUNITY): Payer: Medicaid Other | Admitting: Anesthesiology

## 2014-12-16 DIAGNOSIS — O24113 Pre-existing diabetes mellitus, type 2, in pregnancy, third trimester: Secondary | ICD-10-CM

## 2014-12-16 DIAGNOSIS — Z3A36 36 weeks gestation of pregnancy: Secondary | ICD-10-CM

## 2014-12-16 DIAGNOSIS — Z7984 Long term (current) use of oral hypoglycemic drugs: Secondary | ICD-10-CM

## 2014-12-16 DIAGNOSIS — O1493 Unspecified pre-eclampsia, third trimester: Secondary | ICD-10-CM

## 2014-12-16 DIAGNOSIS — E119 Type 2 diabetes mellitus without complications: Secondary | ICD-10-CM

## 2014-12-16 LAB — CBC WITH DIFFERENTIAL/PLATELET
BASOS ABS: 0 10*3/uL (ref 0.0–0.1)
BASOS PCT: 0 %
EOS PCT: 0 %
Eosinophils Absolute: 0 10*3/uL (ref 0.0–0.7)
HCT: 34.4 % — ABNORMAL LOW (ref 36.0–46.0)
Hemoglobin: 11.1 g/dL — ABNORMAL LOW (ref 12.0–15.0)
LYMPHS PCT: 17 %
Lymphs Abs: 2 10*3/uL (ref 0.7–4.0)
MCH: 30.4 pg (ref 26.0–34.0)
MCHC: 32.3 g/dL (ref 30.0–36.0)
MCV: 94.2 fL (ref 78.0–100.0)
Monocytes Absolute: 0.9 10*3/uL (ref 0.1–1.0)
Monocytes Relative: 8 %
NEUTROS ABS: 8.7 10*3/uL — AB (ref 1.7–7.7)
Neutrophils Relative %: 75 %
PLATELETS: 250 10*3/uL (ref 150–400)
RBC: 3.65 MIL/uL — AB (ref 3.87–5.11)
RDW: 14.4 % (ref 11.5–15.5)
WBC: 11.7 10*3/uL — AB (ref 4.0–10.5)

## 2014-12-16 LAB — CBC
HEMATOCRIT: 34.3 % — AB (ref 36.0–46.0)
HEMOGLOBIN: 11.2 g/dL — AB (ref 12.0–15.0)
MCH: 30.8 pg (ref 26.0–34.0)
MCHC: 32.7 g/dL (ref 30.0–36.0)
MCV: 94.2 fL (ref 78.0–100.0)
PLATELETS: 266 10*3/uL (ref 150–400)
RBC: 3.64 MIL/uL — AB (ref 3.87–5.11)
RDW: 14.5 % (ref 11.5–15.5)
WBC: 10.6 10*3/uL — ABNORMAL HIGH (ref 4.0–10.5)

## 2014-12-16 LAB — GLUCOSE, CAPILLARY
GLUCOSE-CAPILLARY: 96 mg/dL (ref 65–99)
GLUCOSE-CAPILLARY: 92 mg/dL (ref 65–99)
GLUCOSE-CAPILLARY: 90 mg/dL (ref 65–99)
Glucose-Capillary: 83 mg/dL (ref 65–99)
Glucose-Capillary: 92 mg/dL (ref 65–99)

## 2014-12-16 MED ORDER — OXYTOCIN 40 UNITS IN LACTATED RINGERS INFUSION - SIMPLE MED
1.0000 m[IU]/min | INTRAVENOUS | Status: DC
  Administered 2014-12-16: 2 m[IU]/min via INTRAVENOUS
  Filled 2014-12-16: qty 1000

## 2014-12-16 MED ORDER — PHENYLEPHRINE 40 MCG/ML (10ML) SYRINGE FOR IV PUSH (FOR BLOOD PRESSURE SUPPORT)
80.0000 ug | PREFILLED_SYRINGE | INTRAVENOUS | Status: DC | PRN
  Administered 2014-12-16 (×2): 80 ug via INTRAVENOUS
  Filled 2014-12-16: qty 20

## 2014-12-16 MED ORDER — MISOPROSTOL 25 MCG QUARTER TABLET
25.0000 ug | ORAL_TABLET | ORAL | Status: DC | PRN
  Administered 2014-12-16 – 2014-12-17 (×2): 25 ug via VAGINAL
  Filled 2014-12-16 (×2): qty 0.25

## 2014-12-16 MED ORDER — FENTANYL CITRATE (PF) 100 MCG/2ML IJ SOLN
100.0000 ug | INTRAMUSCULAR | Status: DC | PRN
  Administered 2014-12-16 (×2): 100 ug via INTRAVENOUS
  Filled 2014-12-16 (×2): qty 2

## 2014-12-16 MED ORDER — TERBUTALINE SULFATE 1 MG/ML IJ SOLN: 0.2500 mg | Freq: Once | INTRAMUSCULAR | Status: DC | PRN

## 2014-12-16 MED ORDER — FENTANYL 2.5 MCG/ML BUPIVACAINE 1/10 % EPIDURAL INFUSION (WH - ANES)
14.0000 mL/h | INTRAMUSCULAR | Status: DC | PRN
  Administered 2014-12-16: 12 mL/h via EPIDURAL
  Administered 2014-12-16: 14 mL/h via EPIDURAL
  Administered 2014-12-17 (×3): 12 mL/h via EPIDURAL
  Filled 2014-12-16 (×6): qty 125

## 2014-12-16 MED ORDER — LIDOCAINE HCL (PF) 1 % IJ SOLN
INTRAMUSCULAR | Status: DC | PRN
  Administered 2014-12-16 (×2): 4 mL

## 2014-12-16 MED ORDER — EPHEDRINE 5 MG/ML INJ: 10.0000 mg | INTRAVENOUS | Status: DC | PRN

## 2014-12-16 MED ORDER — DIPHENHYDRAMINE HCL 50 MG/ML IJ SOLN: 12.5000 mg | INTRAMUSCULAR | Status: DC | PRN

## 2014-12-16 NOTE — Anesthesia Procedure Notes (Signed)
Epidural Patient location during procedure: OB  Staffing Anesthesiologist: Meelah Tallo Performed by: anesthesiologist   Preanesthetic Checklist Completed: patient identified, site marked, surgical consent, pre-op evaluation, timeout performed, IV checked, risks and benefits discussed and monitors and equipment checked  Epidural Patient position: sitting Prep: site prepped and draped and DuraPrep Patient monitoring: continuous pulse ox and blood pressure Approach: midline Location: L3-L4 Injection technique: LOR saline  Needle:  Needle type: Tuohy  Needle gauge: 17 G Needle length: 9 cm and 9 Needle insertion depth: 8 cm Catheter type: closed end flexible Catheter size: 19 Gauge Catheter at skin depth: 15 cm Test dose: negative  Assessment Events: blood not aspirated, injection not painful, no injection resistance, negative IV test and no paresthesia  Additional Notes Patient identified. Risks/Benefits/Options discussed with patient including but not limited to bleeding, infection, nerve damage, paralysis, failed block, incomplete pain control, headache, blood pressure changes, nausea, vomiting, reactions to medication both or allergic, itching and postpartum back pain. Confirmed with bedside nurse the patient's most recent platelet count. Confirmed with patient that they are not currently taking any anticoagulation, have any bleeding history or any family history of bleeding disorders. Patient expressed understanding and wished to proceed. All questions were answered. Sterile technique was used throughout the entire procedure. Please see nursing notes for vital signs. Test dose was given through epidural catheter and negative prior to continuing to dose epidural or start infusion. Warning signs of high block given to the patient including shortness of breath, tingling/numbness in hands, complete motor block, or any concerning symptoms with instructions to call for help. Patient was  given instructions on fall risk and not to get out of bed. All questions and concerns addressed with instructions to call with any issues or inadequate analgesia.      

## 2014-12-16 NOTE — Progress Notes (Signed)
Patient called out complaining of nausea, ondansetron given.

## 2014-12-16 NOTE — Anesthesia Preprocedure Evaluation (Signed)
Anesthesia Evaluation  Patient identified by MRN, date of birth, ID band Patient awake    Reviewed: Allergy & Precautions, NPO status , Patient's Chart, lab work & pertinent test results  History of Anesthesia Complications Negative for: history of anesthetic complications  Airway Mallampati: III  TM Distance: >3 FB Neck ROM: Full    Dental no notable dental hx. (+) Dental Advisory Given   Pulmonary Current Smoker,    Pulmonary exam normal breath sounds clear to auscultation       Cardiovascular hypertension, Pt. on medications Normal cardiovascular exam Rhythm:Regular Rate:Normal     Neuro/Psych negative neurological ROS  negative psych ROS   GI/Hepatic negative GI ROS, Neg liver ROS,   Endo/Other  diabetes, GestationalMorbid obesity (super morbid obesity, BMI 79.23)  Renal/GU negative Renal ROS  negative genitourinary   Musculoskeletal negative musculoskeletal ROS (+)   Abdominal (+) + obese,   Peds negative pediatric ROS (+)  Hematology negative hematology ROS (+)   Anesthesia Other Findings   Reproductive/Obstetrics (+) Pregnancy                             Anesthesia Physical Anesthesia Plan  ASA: III  Anesthesia Plan: Epidural   Post-op Pain Management:    Induction:   Airway Management Planned:   Additional Equipment:   Intra-op Plan:   Post-operative Plan:   Informed Consent: I have reviewed the patients History and Physical, chart, labs and discussed the procedure including the risks, benefits and alternatives for the proposed anesthesia with the patient or authorized representative who has indicated his/her understanding and acceptance.     Plan Discussed with:   Anesthesia Plan Comments: (See consult note)        Anesthesia Quick Evaluation

## 2014-12-16 NOTE — Progress Notes (Signed)
Kayla Flynn is a 24 y.o. G1P0 at [redacted]w[redacted]d admitted for induction of labor due to preeclampsia.  Subjective: Pt breathing with contractions, requesting IV pain medication.  Family in room for support.  Objective: BP 166/81 mmHg  Pulse 85  Temp(Src) 98.4 F (36.9 C) (Oral)  Resp 18  Ht  (1.473 m)  Wt 171.913 kg (379 lb)  BMI 79.23 kg/m2  SpO2 97%  LMP 01/10/2014 (Approximate) I/O last 3 completed shifts: In: 5815 [P.O.:3340; I.V.:2425; IV Piggyback:50] Out: 3825 [Urine:3825] Total I/O In: 650 [P.O.:650] Out: 4100 [Urine:1100; Emesis/NG output:3000]  FHT:  FHR: 140 bpm, variability: moderate,  accelerations:  Present,  decelerations:  Absent UC:   irregular, every 2-5 minutes SVE:   3 cm with foley bulb palpable inside cervix  Labs: Lab Results  Component Value Date   WBC 10.6* 12/16/2014   HGB 11.2* 12/16/2014   HCT 34.3* 12/16/2014   MCV 94.2 12/16/2014   PLT 266 12/16/2014    Assessment / Plan: Induction of labor due to preeclampsia, FB in place  Labor: Progressing normally Preeclampsia:  on magnesium sulfate Fetal Wellbeing:  Category I Pain Control:  Fentanyl I/D:  n/a Anticipated MOD:  NSVD  LEFTWICH-KIRBY, Joannah Gitlin 12/16/2014, 2:30 AM

## 2014-12-16 NOTE — Progress Notes (Signed)
Labor Progress Note  Kayla Flynn is a 24 y.o. G1P0 at [redacted]w[redacted]d  admitted for induction of labor due to Hypertension with SIP.  S: Patient began feeling contractions early this morning and now has epidural in place.   O:  BP 127/66 mmHg  Pulse 73  Temp(Src) 98.6 F (37 C) (Oral)  Resp 22  Ht  (1.473 m)  Wt 171.913 kg (379 lb)  BMI 79.23 kg/m2  SpO2 96%  LMP 01/10/2014 (Approximate)  Total I/O In: 1570 [P.O.:720; I.V.:850] Out: 1850 [Urine:1850]  FHT:  FHR: 140 bpm, variability: moderate,  accelerations:  Present,  decelerations:  Absent UC:   Difficult to assess due to patient habitus SVE:   Dilation: 4 Effacement (%): 60 Station: Ballotable Exam by:: Dr. Alvester Morin SROM/AROM: Intact  Pitocin @ 4 mu/min  Labs: Lab Results  Component Value Date   WBC 11.7* 12/16/2014   HGB 11.1* 12/16/2014   HCT 34.4* 12/16/2014   MCV 94.2 12/16/2014   PLT 250 12/16/2014    Assessment / Plan: 24 y.o. G1P0 [redacted]w[redacted]d here for IOL due to cHTN with SIP in early/active labor. She is s/p cytotec x4, foley bulb, and progressing slowly on pitocin. Will increase pitocin to rate of 6 mu/min and hold, as unable to adequately monitor fetus.   Labor: Progressing on Pitocin, will continue to increase then potentially AROM. Will apply FSM and IUPC as able.  Fetal Wellbeing:  Category I Pain Control:  Epidural Anticipated MOD:  NSVD  Expectant management  Dani Gobble, MD [facility], PGY-1

## 2014-12-16 NOTE — Progress Notes (Signed)
Labor Progress Note Kayla Flynn is a 24 y.o. G1P0 at [redacted]w[redacted]d presented for IOL for cHTN with severe features. Pregnancy complicated by  GDM and super morbid obesity  S: sleeping comfortably. Has epidural in place and able to move. No blurry vision, HA, swelling, reports mild nausea and needed zofran.   O:  BP 139/42 mmHg  Pulse 64  Temp(Src) 98.7 F (37.1 C) (Oral)  Resp 20  Ht  (1.473 m)  Wt 379 lb (171.913 kg)  BMI 79.23 kg/m2  SpO2 96%  LMP 01/10/2014 (Approximate)  EFM: 125/mod/no accels, no decels Toco- unable to trace or palpate contractions due to BMI and abdominal adipose tissue.   CVE: Dilation: 3.5 Effacement (%): 50 Cervical Position: Anterior Station: -3 Presentation: Vertex Exam by:: Dr. Alvester Morin   A&P: 24 y.o. G1P0 [redacted]w[redacted]d here for IOL for cHTN with severe features #Labor: slow progression and complicated by inability to monitor contractions and not currently favorable for rupture.  Will d/c pitocin and start cytotec vaginally q 4 hours.  Once better applied to cervix and improved exam will AROM.  #Pain: epidural in place #FWB: Reassuring #GBS Negative  #cHTN with severe features: currently on magnesium. UOP appropriate. Held evening labetalol given low BPs.   Federico Flake, MD 11:39 PM

## 2014-12-17 ENCOUNTER — Ambulatory Visit (HOSPITAL_COMMUNITY): Payer: Medicaid Other

## 2014-12-17 DIAGNOSIS — O1493 Unspecified pre-eclampsia, third trimester: Secondary | ICD-10-CM

## 2014-12-17 DIAGNOSIS — E119 Type 2 diabetes mellitus without complications: Secondary | ICD-10-CM

## 2014-12-17 DIAGNOSIS — O24113 Pre-existing diabetes mellitus, type 2, in pregnancy, third trimester: Secondary | ICD-10-CM

## 2014-12-17 DIAGNOSIS — Z7984 Long term (current) use of oral hypoglycemic drugs: Secondary | ICD-10-CM

## 2014-12-17 DIAGNOSIS — Z3A36 36 weeks gestation of pregnancy: Secondary | ICD-10-CM

## 2014-12-17 LAB — GLUCOSE, CAPILLARY
GLUCOSE-CAPILLARY: 99 mg/dL (ref 65–99)
GLUCOSE-CAPILLARY: 96 mg/dL (ref 65–99)
Glucose-Capillary: 136 mg/dL — ABNORMAL HIGH (ref 65–99)
Glucose-Capillary: 67 mg/dL (ref 65–99)
Glucose-Capillary: 78 mg/dL (ref 65–99)
Glucose-Capillary: 84 mg/dL (ref 65–99)
Glucose-Capillary: 92 mg/dL (ref 65–99)

## 2014-12-17 MED ORDER — TERBUTALINE SULFATE 1 MG/ML IJ SOLN: 0.2500 mg | Freq: Once | INTRAMUSCULAR | Status: DC | PRN

## 2014-12-17 MED ORDER — OXYTOCIN 40 UNITS IN LACTATED RINGERS INFUSION - SIMPLE MED
1.0000 m[IU]/min | INTRAVENOUS | Status: DC
  Administered 2014-12-18: 10 m[IU]/min via INTRAVENOUS

## 2014-12-17 MED ORDER — OXYTOCIN 40 UNITS IN LACTATED RINGERS INFUSION - SIMPLE MED
1.0000 m[IU]/min | INTRAVENOUS | Status: DC
  Administered 2014-12-17: 2 m[IU]/min via INTRAVENOUS

## 2014-12-17 NOTE — Progress Notes (Signed)
LABOR PROGRESS NOTE  Thurnell Garbebony Lightle is a 24 y.o. G1P0 at 7934w6d  admitted for cHTN w/ severe BP. Also BDM and morbid obesity.  Subjective: Diarrhea, minimal pain w/ contractions s/p epidural, no vomiting.  Objective: BP 134/71 mmHg  Pulse 65  Temp(Src) 97.8 F (36.6 C) (Axillary)  Resp 16  Ht 4\' 10"  (1.473 m)  Wt 379 lb (171.913 kg)  BMI 79.23 kg/m2  SpO2 96%  LMP 01/10/2014 (Approximate) or  Filed Vitals:   12/17/14 1301 12/17/14 1333 12/17/14 1400 12/17/14 1402  BP: 126/57 133/56  134/71  Pulse: 61 63  65  Temp:   97.8 F (36.6 C)   TempSrc:   Axillary   Resp: 20  16   Height:      Weight:      SpO2:        Dilation: 4 Effacement (%): 70 Cervical Position: Anterior Station: -2 Presentation: Vertex Exam by:: Dr. Sampson GoonFitzgerald and Dr. Ashok PallWouk  Labs: Lab Results  Component Value Date   WBC 11.7* 12/16/2014   HGB 11.1* 12/16/2014   HCT 34.4* 12/16/2014   MCV 94.2 12/16/2014   PLT 250 12/16/2014    Patient Active Problem List   Diagnosis Date Noted  . Hypertension in pregnancy 12/15/2014  . Pre-eclampsia added to pre-existing hypertension 12/15/2014  . Gestational hypertension   . Preeclampsia 12/10/2014  . Chronic benign essential hypertension, antepartum 11/26/2014  . Syphilis 08/11/2014  . Obesity during pregnancy in second trimester   . Encounter for fetal anatomic survey   . Chronic hypertension in obstetric context in second trimester   . Tobacco use disorder 07/21/2014  . Tobacco smoking affecting pregnancy in second trimester, antepartum 07/21/2014  . Maternal varicella, non-immune 07/21/2014  . 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 Supervision of high-risk pregnancy 07/14/2014  . Pre-existing type 2 diabetes mellitus during pregnancy in first trimester ([facility])   . Obesity, morbid, BMI 82 ([facility]) 07/18/2012  . Extreme obesity ([facility]CC) 07/18/2012  . DM type 2 (diabetes mellitus, type 2) (H[facility]) 10/12/2010    Assessment  / Plan: 24 y.o. G1P0 at 334w6d here for IOL 2/2 cHTN  Labor: just achieved borderline adequacy with pitocin, will plan to check cervical dilation in 4 hours. Is s/p AROM cHTN w/ severe-range BPs: no severe range BPs today, on labetalol 400 bid and magnesium BDM: most recent glucose 136, prior to that all less than 120, if elevated in 1 hour will start glucostabilizer Fetal Wellbeing:  Cat 1 Pain Control:  epidural Anticipated MOD:  vaginal  Silvano BilisNoah B Britzy Graul, MD 12/17/2014, 2:08 PM

## 2014-12-17 NOTE — Progress Notes (Signed)
Patient given apple juice for lower blood sugar value.

## 2014-12-17 NOTE — Progress Notes (Signed)
Called by RN another deceleration.  NST 130/mod/+accels, +decel- variable appearing with nadir at 80, lasting about 4 minutes but difficult trace FHR smoothly.   This is the 3 time this patient has had morphologically similar decelerations. Currently we are limited in our ability to monitor infant and contractions given BMI of 79 and additionally internal monitors are not appropriate at this time as the patient remains ballotable.   We have continued to discuss with patient the difficulty with this situation as we are unable to increase pitocin given inability to track contractions and fetal heart rate adequately but we are unable to place internal monitors as the patient is currently intact and ballotable.  Will discuss with day team possible ROM.  Federico FlakeKimberly Niles Carmin Alvidrez, MD

## 2014-12-17 NOTE — Progress Notes (Signed)
Labor Progress Note  Kayla Flynn is a 24 y.o. G1P0 at 3135w6d  admitted for induction of labor due to Hypertension.  S: Patient comfortable with epidural. Feeling contractions as abdominal pressure.   O:  BP 150/74 mmHg  Pulse 65  Temp(Src) 98.1 F (36.7 C) (Axillary)  Resp 20  Ht 4\' 10"  (1.473 m)  Wt 171.913 kg (379 lb)  BMI 79.23 kg/m2  SpO2 97%  LMP 01/10/2014 (Approximate)  Total I/O In: 567 [P.O.:342; I.V.:225] Out: 1800 [Urine:1800]  FHT:  FHR: 125 bpm, variability: moderate,  accelerations:  Present,  decelerations:  Absent UC:   regular, every 1-5 minutes SVE:   Dilation: 4 Effacement (%): 70 Station: -2 Exam by:: Dr. Sampson GoonFitzgerald and Dr. Ashok PallWouk AROM: @ 0900, clear fluid  Pitocin @ 4 mu/min  Labs: Lab Results  Component Value Date   WBC 11.7* 12/16/2014   HGB 11.1* 12/16/2014   HCT 34.4* 12/16/2014   MCV 94.2 12/16/2014   PLT 250 12/16/2014    Assessment / Plan: 24 y.o. G1P0 2935w6d in early/active labor Induction of labor due to cHTN and also with gDM,  progressing slowly s/p cytotec and pitocin. Will increase pitocin now that IUPC and FSE in place.   Labor: Will continue to increase pitocin Fetal Wellbeing:  Category I Pain Control:  Epidural Anticipated MOD:  NSVD  Expectant management  Dani GobbleHillary Lenorris Karger, MD [facility], PGY-1

## 2014-12-17 NOTE — Progress Notes (Signed)
Labor Progress Note  Kayla Flynn is a 24 y.o. G1P0 at 5520w6d  admitted for induction due to hypertension  S: Patient reports that she is not feeling any pain, but can feel the pressure of her contractions. She has also been experiencing hourly episodes of diarrhea.  Team has had difficulties tracing contractions, and cannot increase pitocin until team obtains better tracings. Difficulty may be due to movement of the IUPC. Therefore, old IUPC was removed and new IUPC was placed by Dr. Ashok PallWouk.  O:  BP 145/76 mmHg  Pulse 64  Temp(Src) 97.9 F (36.6 C) (Axillary)  Resp 20  Ht 4\' 10"  (1.473 m)  Wt 171.913 kg (379 lb)  BMI 79.23 kg/m2  SpO2 96%  LMP 01/10/2014 (Approximate)  Total I/O In: 2289 [P.O.:1527; I.V.:762] Out: 2975 [Urine:2975]  FHT:  FHR: 135 bpm, variability: moderate,  accelerations:  Present,  decelerations:  Absent UC:   Contractions regularly every 1-3 min lasting 60-90 min. Moderate quality. SVE:   Dilation: 4.5 Effacement (%): 80 Station: -1 Exam by:: Wouk MD  AROM @0900 : clear   Pitocin @ 14 mu/min  Labs: Lab Results  Component Value Date   WBC 11.7* 12/16/2014   HGB 11.1* 12/16/2014   HCT 34.4* 12/16/2014   MCV 94.2 12/16/2014   PLT 250 12/16/2014    Assessment / Plan: 24 y.o. G1P0 5720w6d IOL for hypertension in early/active labor Induction of labor due to cHTN,  Not progressing well on pitocin.  Labor: slow progression on pitocin. Plan to steadily increase and follow Fetal Wellbeing:  Category I Pain Control:  Epidural Anticipated MOD:  NSVD   Expectant management at this time   Aura Feyhelsea Freddi Schrager, MS3 Medical Student [employer]

## 2014-12-17 NOTE — Progress Notes (Signed)
Labor Progress Note Kayla Flynn is a 24 y.o. G1P0 at 6353w6d  presented for IOL for cHTN with severe features. Pregnancy complicated by  GDM and super morbid obesity (BMI 81)  S: sleeping comfortably. Has epidural in place and able to move. No blurry vision, HA, swelling, nausea, or RUQ pain. Called to the room for variable deceleration and request to   O:  BP 136/54 mmHg  Pulse 60  Temp(Src) 98.6 F (37 C) (Oral)  Resp 20  Ht 4\' 10"  (1.473 m)  Wt 379 lb (171.913 kg)  BMI 79.23 kg/m2  SpO2 96%  LMP 01/10/2014 (Approximate)  EFM: 125/mod/no accels, +variable deceleration.  Toco- unable to trace or palpate contractions due to BMI and abdominal adipose tissue.   CVE: Dilation: 3.5 Effacement (%): 70 Cervical Position: Anterior Station: Ballotable Presentation: Vertex Exam by:: Dr. Alvester MorinNewton   A&P: 24 y.o. G1P0 5553w6d  here for IOL for cHTN with severe features  #Labor:  Induction has included thus far: cytotec x 3 on 10/10, foley bulb which came out at ~0100 on 10/11, and low dose pitocin for 14 hours on 10/11. On repeated exams, patient is unfavorable for rupture with ballotable infant.  10/12 at 0000 decision was made to try additional cytotec doses given thickness/firm nature of cervix. Cytotec #1 at 1200 and #2 placed at 4:30. I have discussed with care team and patient that we are currently limited in our interventions to induce labor given limitations of monitoring. Patient needs internal monitors to assist with fetal heart tracing and contraction titration but we have been unable to do so given the high risk of cord prolapse given ballotable nature of fetus and the increased infectious risk with ROM. At next check will restart pitocin. Continue to assess for AROM.   #Pain: epidural in place #FWB: Cat II. Unable to trace infant reliably. Variable deceleration resolved with position changes.  #GBS Negative  #cHTN with severe features: currently on magnesium. UOP appropriate. Held  evening labetalol given low BPs.   Federico FlakeKimberly Niles Jerrold Haskell, MD 4:34 AM

## 2014-12-17 NOTE — Progress Notes (Signed)
Kayla Flynn is a 24 y.o. G1P0 at 2873w6d   Subjective: Mostly comfortable w/ epidural; Mag sulfate infusing; CBG 78 from 8p; IUPC pattern more dampened recently  Objective: BP 163/85 mmHg  Pulse 64  Temp(Src) 98.2 F (36.8 C) (Oral)  Resp 18  Ht 4\' 10"  (1.473 m)  Wt 171.913 kg (379 lb)  BMI 79.23 kg/m2  SpO2 96%  LMP 01/10/2014 (Approximate) I/O last 3 completed shifts: In: 7271 [P.O.:3809; I.V.:3462] Out: 8975 [Urine:8975] Total I/O In: 400 [P.O.:400] Out: 1400 [Urine:1400]  FHT:  FHR: 130s bpm, variability: moderate,  accelerations:  Present,  decelerations:  Absent UC:   regular, every 2-3 minutes w/ 7012mu/min Pit SVE:   Dilation: 4.5 Effacement (%): 80 Station: -2 Exam by:: Kayla Flynn CNM  Labs: Lab Results  Component Value Date   WBC 11.7* 12/16/2014   HGB 11.1* 12/16/2014   HCT 34.4* 12/16/2014   MCV 94.2 12/16/2014   PLT 250 12/16/2014    Assessment / Plan: IUP@36 .6wks cHTN w/ severe features DM- B  Flushed, rezeroed, sl repositioned IUPC in order to better read ctx Keep MVUs adequate  Cam HaiSHAW, KIMBERLY CNM 12/17/2014, 11:47 PM

## 2014-12-17 NOTE — Progress Notes (Signed)
LABOR PROGRESS NOTE  Kayla Flynn is a 24 y.o. G1P0 at 2790w6d  admitted for cHTN w/ severe BP. Also BDM and morbid obesity.  Subjective: Diarrhea, minimal pain w/ contractions s/p epidural, no vomiting.  Objective: BP 149/75 mmHg  Pulse 69  Temp(Src) 98.7 F (37.1 C) (Oral)  Resp 16  Ht 4\' 10"  (1.473 m)  Wt 379 lb (171.913 kg)  BMI 79.23 kg/m2  SpO2 96%  LMP 01/10/2014 (Approximate) or  Filed Vitals:   12/17/14 1802 12/17/14 1832 12/17/14 1854 12/17/14 1900  BP: 106/50 131/71  149/75  Pulse: 69 65  69  Temp:   98.7 F (37.1 C)   TempSrc:   Oral   Resp:   16   Height:      Weight:      SpO2:        Dilation: 4.5 Effacement (%): 80 Cervical Position: Anterior Station: -2 Presentation: Vertex Exam by:: Dr. Emelda FearFerguson  Labs: Lab Results  Component Value Date   WBC 11.7* 12/16/2014   HGB 11.1* 12/16/2014   HCT 34.4* 12/16/2014   MCV 94.2 12/16/2014   PLT 250 12/16/2014    Patient Active Problem List   Diagnosis Date Noted  . Hypertension in pregnancy 12/15/2014  . Pre-eclampsia added to pre-existing hypertension 12/15/2014  . Gestational hypertension   . Preeclampsia 12/10/2014  . Chronic benign essential hypertension, antepartum 11/26/2014  . Syphilis 08/11/2014  . Obesity during pregnancy in second trimester   . Encounter for fetal anatomic survey   . Chronic hypertension in obstetric context in second trimester   . Tobacco use disorder 07/21/2014  . Tobacco smoking affecting pregnancy in second trimester, antepartum 07/21/2014  . Maternal varicella, non-immune 07/21/2014  . 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 Supervision of high-risk pregnancy 07/14/2014  . Pre-existing type 2 diabetes mellitus during pregnancy in first trimester (HCC)   . Obesity, morbid, BMI 82 (HCC) 07/18/2012  . Extreme obesity (HCC) 07/18/2012  . DM type 2 (diabetes mellitus, type 2) (HCC) 10/12/2010    Assessment / Plan: 24 y.o. G1P0  at 5790w6d here for IOL 2/2 cHTN  Labor: now 11 hours s/p arom and pitocin. Adequate mvus then tachysystole so had to reduce, now uptitrating again. Remains in latent labor. cHTN w/ severe-range BPs: no severe range BPs today, on labetalol 400 bid and magnesium BDM: all f/s glucose checks wnl save for one elevated after drinking soda; continuing q4 checks in latent labor Fetal Wellbeing:  Cat 1 Pain Control:  epidural Anticipated MOD:  vaginal  Silvano BilisNoah B Kayla Overholt, MD 12/17/2014, 7:54 PM

## 2014-12-18 ENCOUNTER — Encounter (HOSPITAL_COMMUNITY): Payer: Self-pay | Admitting: Anesthesiology

## 2014-12-18 ENCOUNTER — Encounter (HOSPITAL_COMMUNITY)
Admission: AD | Disposition: A | Discharge status: Home / Self Care | Payer: Self-pay | Source: Ambulatory Visit | Attending: Obstetrics & Gynecology

## 2014-12-18 DIAGNOSIS — Z7984 Long term (current) use of oral hypoglycemic drugs: Secondary | ICD-10-CM

## 2014-12-18 DIAGNOSIS — O99214 Obesity complicating childbirth: Secondary | ICD-10-CM

## 2014-12-18 DIAGNOSIS — Z3A37 37 weeks gestation of pregnancy: Secondary | ICD-10-CM

## 2014-12-18 DIAGNOSIS — O2412 Pre-existing diabetes mellitus, type 2, in childbirth: Secondary | ICD-10-CM

## 2014-12-18 DIAGNOSIS — O1414 Severe pre-eclampsia complicating childbirth: Secondary | ICD-10-CM

## 2014-12-18 DIAGNOSIS — E119 Type 2 diabetes mellitus without complications: Secondary | ICD-10-CM

## 2014-12-18 LAB — COMPREHENSIVE METABOLIC PANEL
ALBUMIN: 2.5 g/dL — AB (ref 3.5–5.0)
ALK PHOS: 204 U/L — AB (ref 38–126)
ALT: 31 U/L (ref 14–54)
ANION GAP: 6 (ref 5–15)
AST: 50 U/L — AB (ref 15–41)
CALCIUM: 7.6 mg/dL — AB (ref 8.9–10.3)
CO2: 24 mmol/L (ref 22–32)
CREATININE: 0.82 mg/dL (ref 0.44–1.00)
Chloride: 101 mmol/L (ref 101–111)
GFR calc Af Amer: >60 mL/min (ref >60)
GFR calc non Af Amer: >60 mL/min (ref >60)
GLUCOSE: 81 mg/dL (ref 65–99)
Potassium: 3.9 mmol/L (ref 3.5–5.1)
SODIUM: 131 mmol/L — AB (ref 135–145)
Total Bilirubin: 0.6 mg/dL (ref 0.3–1.2)
Total Protein: 6.4 g/dL — ABNORMAL LOW (ref 6.5–8.1)

## 2014-12-18 LAB — CBC
HEMATOCRIT: 35.8 % — AB (ref 36.0–46.0)
HEMATOCRIT: 34.5 % — AB (ref 36.0–46.0)
HEMOGLOBIN: 11.5 g/dL — AB (ref 12.0–15.0)
HEMOGLOBIN: 11.1 g/dL — AB (ref 12.0–15.0)
MCH: 30.2 pg (ref 26.0–34.0)
MCH: 30.2 pg (ref 26.0–34.0)
MCHC: 32.1 g/dL (ref 30.0–36.0)
MCHC: 32.2 g/dL (ref 30.0–36.0)
MCV: 94 fL (ref 78.0–100.0)
MCV: 94 fL (ref 78.0–100.0)
Platelets: 227 10*3/uL (ref 150–400)
Platelets: 226 10*3/uL (ref 150–400)
RBC: 3.81 MIL/uL — AB (ref 3.87–5.11)
RBC: 3.67 MIL/uL — ABNORMAL LOW (ref 3.87–5.11)
RDW: 14.1 % (ref 11.5–15.5)
RDW: 14 % (ref 11.5–15.5)
WBC: 17 10*3/uL — AB (ref 4.0–10.5)
WBC: 19.5 10*3/uL — AB (ref 4.0–10.5)

## 2014-12-18 LAB — CCBB MATERNAL DONOR DRAW

## 2014-12-18 LAB — GLUCOSE, CAPILLARY
GLUCOSE-CAPILLARY: 88 mg/dL (ref 65–99)
Glucose-Capillary: 75 mg/dL (ref 65–99)
Glucose-Capillary: 76 mg/dL (ref 65–99)
Glucose-Capillary: 81 mg/dL (ref 65–99)
Glucose-Capillary: 85 mg/dL (ref 65–99)

## 2014-12-18 LAB — MAGNESIUM: Magnesium: 5.2 mg/dL — ABNORMAL HIGH (ref 1.7–2.4)

## 2014-12-18 LAB — PREPARE RBC (CROSSMATCH)

## 2014-12-18 SURGERY — Surgical Case
Anesthesia: Epidural

## 2014-12-18 MED ORDER — SENNOSIDES-DOCUSATE SODIUM 8.6-50 MG PO TABS
2.0000 | ORAL_TABLET | ORAL | Status: DC
  Administered 2014-12-19 – 2014-12-20 (×3): 2 via ORAL
  Filled 2014-12-18 (×3): qty 2

## 2014-12-18 MED ORDER — NALBUPHINE HCL 10 MG/ML IJ SOLN: 5.0000 mg | Freq: Once | INTRAMUSCULAR | Status: DC | PRN

## 2014-12-18 MED ORDER — NALBUPHINE HCL 10 MG/ML IJ SOLN: 5.0000 mg | INTRAMUSCULAR | Status: DC | PRN

## 2014-12-18 MED ORDER — MEPERIDINE HCL 25 MG/ML IJ SOLN: 6.2500 mg | INTRAMUSCULAR | Status: DC | PRN

## 2014-12-18 MED ORDER — SODIUM CHLORIDE 0.9 % IJ SOLN
INTRAMUSCULAR | Status: AC
  Filled 2014-12-18: qty 10

## 2014-12-18 MED ORDER — PHENYLEPHRINE 40 MCG/ML (10ML) SYRINGE FOR IV PUSH (FOR BLOOD PRESSURE SUPPORT)
PREFILLED_SYRINGE | INTRAVENOUS | Status: AC
  Filled 2014-12-18: qty 10

## 2014-12-18 MED ORDER — BUPIVACAINE HCL (PF) 0.25 % IJ SOLN
INTRAMUSCULAR | Status: DC | PRN
  Administered 2014-12-18: 5 mL via EPIDURAL

## 2014-12-18 MED ORDER — ONDANSETRON HCL 4 MG/2ML IJ SOLN
INTRAMUSCULAR | Status: DC | PRN
  Administered 2014-12-18: 4 mg via INTRAVENOUS

## 2014-12-18 MED ORDER — LACTATED RINGERS IV SOLN
INTRAVENOUS | Status: DC
  Administered 2014-12-18 – 2014-12-19 (×2): via INTRAVENOUS

## 2014-12-18 MED ORDER — FENTANYL CITRATE (PF) 100 MCG/2ML IJ SOLN
INTRAMUSCULAR | Status: DC | PRN
  Administered 2014-12-18: 25 ug via INTRAVENOUS

## 2014-12-18 MED ORDER — SODIUM CHLORIDE 0.9 % IV SOLN
INTRAVENOUS | Status: DC | PRN
  Administered 2014-12-18: 09:00:00 via INTRAVENOUS

## 2014-12-18 MED ORDER — ACETAMINOPHEN 500 MG PO TABS
1000.0000 mg | ORAL_TABLET | Freq: Four times a day (QID) | ORAL | Status: AC
  Administered 2014-12-18 – 2014-12-19 (×2): 1000 mg via ORAL
  Filled 2014-12-18 (×2): qty 2

## 2014-12-18 MED ORDER — NALOXONE HCL 0.4 MG/ML IJ SOLN: 0.4000 mg | INTRAMUSCULAR | Status: DC | PRN

## 2014-12-18 MED ORDER — BUPIVACAINE HCL (PF) 0.5 % IJ SOLN
INTRAMUSCULAR | Status: AC
  Filled 2014-12-18: qty 30

## 2014-12-18 MED ORDER — TETANUS-DIPHTH-ACELL PERTUSSIS 5-2.5-18.5 LF-MCG/0.5 IM SUSP
0.5000 mL | Freq: Once | INTRAMUSCULAR | Status: DC
  Filled 2014-12-18: qty 0.5

## 2014-12-18 MED ORDER — MAGNESIUM SULFATE 50 % IJ SOLN
2.0000 g/h | INTRAMUSCULAR | Status: AC
  Administered 2014-12-18: 2 g/h via INTRAVENOUS
  Filled 2014-12-18 (×2): qty 80

## 2014-12-18 MED ORDER — DEXTROSE 5 % IV SOLN
2.0000 g | Freq: Two times a day (BID) | INTRAVENOUS | Status: AC
  Administered 2014-12-19 (×2): 2 g via INTRAVENOUS
  Filled 2014-12-18 (×3): qty 2

## 2014-12-18 MED ORDER — OXYTOCIN 10 UNIT/ML IJ SOLN
40.0000 [IU] | INTRAVENOUS | Status: DC | PRN
  Administered 2014-12-18: 40 [IU] via INTRAVENOUS

## 2014-12-18 MED ORDER — MEASLES, MUMPS & RUBELLA VAC ~~LOC~~ INJ
0.5000 mL | INJECTION | Freq: Once | SUBCUTANEOUS | Status: DC
  Filled 2014-12-18: qty 0.5

## 2014-12-18 MED ORDER — KETOROLAC TROMETHAMINE 30 MG/ML IJ SOLN: 30.0000 mg | Freq: Once | INTRAMUSCULAR | Status: DC

## 2014-12-18 MED ORDER — PHENYLEPHRINE HCL 10 MG/ML IJ SOLN
INTRAMUSCULAR | Status: DC | PRN
  Administered 2014-12-18 (×5): 40 ug via INTRAVENOUS

## 2014-12-18 MED ORDER — DIPHENHYDRAMINE HCL 25 MG PO CAPS: 25.0000 mg | ORAL_CAPSULE | ORAL | Status: DC | PRN

## 2014-12-18 MED ORDER — SODIUM CHLORIDE 0.9 % IJ SOLN
INTRAMUSCULAR | Status: AC
  Filled 2014-12-18: qty 3

## 2014-12-18 MED ORDER — SIMETHICONE 80 MG PO CHEW
80.0000 mg | CHEWABLE_TABLET | ORAL | Status: DC
  Administered 2014-12-19: 80 mg via ORAL
  Filled 2014-12-18: qty 1

## 2014-12-18 MED ORDER — ACETAMINOPHEN 325 MG PO TABS
650.0000 mg | ORAL_TABLET | ORAL | Status: DC | PRN
  Administered 2014-12-18: 650 mg via ORAL
  Filled 2014-12-18: qty 2

## 2014-12-18 MED ORDER — MENTHOL 3 MG MT LOZG
1.0000 | LOZENGE | OROMUCOSAL | Status: DC | PRN
  Administered 2014-12-19: 3 mg via ORAL
  Filled 2014-12-18: qty 9

## 2014-12-18 MED ORDER — DEXTROSE 5 % IV SOLN
2.0000 g | Freq: Once | INTRAVENOUS | Status: AC
  Administered 2014-12-18: 2 g via INTRAVENOUS
  Filled 2014-12-18: qty 2

## 2014-12-18 MED ORDER — KETOROLAC TROMETHAMINE 30 MG/ML IJ SOLN
INTRAMUSCULAR | Status: AC
  Administered 2014-12-18: 30 mg
  Filled 2014-12-18: qty 1

## 2014-12-18 MED ORDER — PRENATAL MULTIVITAMIN CH
1.0000 | ORAL_TABLET | Freq: Every day | ORAL | Status: DC
  Administered 2014-12-19 – 2014-12-20 (×2): 1 via ORAL
  Filled 2014-12-18 (×2): qty 1

## 2014-12-18 MED ORDER — ZOLPIDEM TARTRATE 5 MG PO TABS: 5.0000 mg | ORAL_TABLET | Freq: Every evening | ORAL | Status: DC | PRN

## 2014-12-18 MED ORDER — FERROUS SULFATE 325 (65 FE) MG PO TABS
325.0000 mg | ORAL_TABLET | Freq: Two times a day (BID) | ORAL | Status: DC
  Administered 2014-12-18 – 2014-12-21 (×6): 325 mg via ORAL
  Filled 2014-12-18 (×6): qty 1

## 2014-12-18 MED ORDER — NALBUPHINE HCL 10 MG/ML IJ SOLN
5.0000 mg | INTRAMUSCULAR | Status: DC | PRN
Start: 2014-12-18 — End: 2014-12-21

## 2014-12-18 MED ORDER — FENTANYL CITRATE (PF) 100 MCG/2ML IJ SOLN
INTRAMUSCULAR | Status: AC
  Filled 2014-12-18: qty 2

## 2014-12-18 MED ORDER — DIBUCAINE 1 % RE OINT: 1.0000 "application " | TOPICAL_OINTMENT | RECTAL | Status: DC | PRN

## 2014-12-18 MED ORDER — OXYCODONE-ACETAMINOPHEN 5-325 MG PO TABS: 2.0000 | ORAL_TABLET | ORAL | Status: DC | PRN

## 2014-12-18 MED ORDER — SIMETHICONE 80 MG PO CHEW
80.0000 mg | CHEWABLE_TABLET | ORAL | Status: DC | PRN
Start: 2014-12-18 — End: 2014-12-21

## 2014-12-18 MED ORDER — MORPHINE SULFATE (PF) 0.5 MG/ML IJ SOLN
INTRAMUSCULAR | Status: DC | PRN
  Administered 2014-12-18: 2 mg via EPIDURAL

## 2014-12-18 MED ORDER — METOCLOPRAMIDE HCL 5 MG/ML IJ SOLN
INTRAMUSCULAR | Status: DC | PRN
  Administered 2014-12-18 (×2): 5 mg via INTRAVENOUS

## 2014-12-18 MED ORDER — SODIUM CHLORIDE 0.9 % IR SOLN
Status: DC | PRN
  Administered 2014-12-18: 1000 mL

## 2014-12-18 MED ORDER — MEPERIDINE HCL 25 MG/ML IJ SOLN
6.2500 mg | INTRAMUSCULAR | Status: DC | PRN
Start: 2014-12-18 — End: 2014-12-18

## 2014-12-18 MED ORDER — ONDANSETRON HCL 4 MG/2ML IJ SOLN: 4.0000 mg | Freq: Three times a day (TID) | INTRAMUSCULAR | Status: DC | PRN

## 2014-12-18 MED ORDER — ERYTHROMYCIN 5 MG/GM OP OINT
TOPICAL_OINTMENT | OPHTHALMIC | Status: AC
  Filled 2014-12-18: qty 1

## 2014-12-18 MED ORDER — MORPHINE SULFATE (PF) 0.5 MG/ML IJ SOLN
INTRAMUSCULAR | Status: AC
  Filled 2014-12-18: qty 100

## 2014-12-18 MED ORDER — SODIUM BICARBONATE 8.4 % IV SOLN
INTRAVENOUS | Status: DC | PRN
  Administered 2014-12-18: 5 mL via EPIDURAL
  Administered 2014-12-18: 2 mL via EPIDURAL
  Administered 2014-12-18: 5 mL via EPIDURAL
  Administered 2014-12-18: 2 mL via EPIDURAL

## 2014-12-18 MED ORDER — OXYCODONE-ACETAMINOPHEN 5-325 MG PO TABS
1.0000 | ORAL_TABLET | ORAL | Status: DC | PRN
  Administered 2014-12-19 – 2014-12-21 (×7): 1 via ORAL
  Filled 2014-12-18 (×7): qty 1

## 2014-12-18 MED ORDER — FENTANYL CITRATE (PF) 100 MCG/2ML IJ SOLN
25.0000 ug | INTRAMUSCULAR | Status: DC | PRN
  Administered 2014-12-18 (×2): 25 ug via INTRAVENOUS

## 2014-12-18 MED ORDER — FENTANYL CITRATE (PF) 100 MCG/2ML IJ SOLN
INTRAMUSCULAR | Status: AC
  Filled 2014-12-18: qty 4

## 2014-12-18 MED ORDER — DEXTROSE 5 % IV SOLN: 2.0000 g | Freq: Once | INTRAVENOUS | Status: DC

## 2014-12-18 MED ORDER — LANOLIN HYDROUS EX OINT
1.0000 | TOPICAL_OINTMENT | CUTANEOUS | Status: DC | PRN
Start: 2014-12-18 — End: 2014-12-21

## 2014-12-18 MED ORDER — WITCH HAZEL-GLYCERIN EX PADS
1.0000 | MEDICATED_PAD | CUTANEOUS | Status: DC | PRN
Start: 2014-12-18 — End: 2014-12-21

## 2014-12-18 MED ORDER — OXYTOCIN 40 UNITS IN LACTATED RINGERS INFUSION - SIMPLE MED: 62.5000 mL/h | INTRAVENOUS | Status: AC

## 2014-12-18 MED ORDER — SODIUM CHLORIDE 0.9 % IJ SOLN: 3.0000 mL | INTRAMUSCULAR | Status: DC | PRN

## 2014-12-18 MED ORDER — IBUPROFEN 600 MG PO TABS: 600.0000 mg | ORAL_TABLET | Freq: Four times a day (QID) | ORAL | Status: DC

## 2014-12-18 MED ORDER — METOCLOPRAMIDE HCL 5 MG/ML IJ SOLN
INTRAMUSCULAR | Status: AC
  Filled 2014-12-18: qty 2

## 2014-12-18 MED ORDER — MAGNESIUM HYDROXIDE 400 MG/5ML PO SUSP
30.0000 mL | ORAL | Status: DC | PRN
  Filled 2014-12-18: qty 30

## 2014-12-18 MED ORDER — NALOXONE HCL 1 MG/ML IJ SOLN
1.0000 ug/kg/h | INTRAVENOUS | Status: DC | PRN
  Filled 2014-12-18: qty 2

## 2014-12-18 MED ORDER — SCOPOLAMINE 1 MG/3DAYS TD PT72
MEDICATED_PATCH | TRANSDERMAL | Status: AC
  Filled 2014-12-18: qty 1

## 2014-12-18 MED ORDER — ONDANSETRON HCL 4 MG/2ML IJ SOLN
INTRAMUSCULAR | Status: AC
  Administered 2014-12-18: 4 mg
  Filled 2014-12-18: qty 2

## 2014-12-18 MED ORDER — OXYTOCIN 10 UNIT/ML IJ SOLN
INTRAMUSCULAR | Status: AC
  Filled 2014-12-18: qty 4

## 2014-12-18 MED ORDER — ZOLPIDEM TARTRATE 5 MG PO TABS
5.0000 mg | ORAL_TABLET | Freq: Every evening | ORAL | Status: DC | PRN
  Administered 2014-12-18: 5 mg via ORAL
  Filled 2014-12-18: qty 1

## 2014-12-18 MED ORDER — SCOPOLAMINE 1 MG/3DAYS TD PT72
1.0000 | MEDICATED_PATCH | Freq: Once | TRANSDERMAL | Status: AC
  Administered 2014-12-18: 1.5 mg via TRANSDERMAL

## 2014-12-18 MED ORDER — ONDANSETRON HCL 4 MG/2ML IJ SOLN
INTRAMUSCULAR | Status: AC
  Filled 2014-12-18: qty 2

## 2014-12-18 MED ORDER — DIPHENHYDRAMINE HCL 50 MG/ML IJ SOLN: 12.5000 mg | INTRAMUSCULAR | Status: DC | PRN

## 2014-12-18 MED ORDER — PROMETHAZINE HCL 25 MG/ML IJ SOLN: 6.2500 mg | INTRAMUSCULAR | Status: DC | PRN

## 2014-12-18 MED ORDER — DIPHENHYDRAMINE HCL 25 MG PO CAPS: 25.0000 mg | ORAL_CAPSULE | Freq: Four times a day (QID) | ORAL | Status: DC | PRN

## 2014-12-18 SURGICAL SUPPLY — 46 items
BENZOIN TINCTURE PRP APPL 2/3 (GAUZE/BANDAGES/DRESSINGS) IMPLANT
CLAMP CORD UMBIL (MISCELLANEOUS) IMPLANT
CLOTH BEACON ORANGE TIMEOUT ST (SAFETY) ×2 IMPLANT
DECANTER SPIKE VIAL GLASS SM (MISCELLANEOUS) ×2 IMPLANT
DRAIN JACKSON PRT FLT 7MM (DRAIN) ×2 IMPLANT
DRAPE SHEET LG 3/4 BI-LAMINATE (DRAPES) IMPLANT
DRESSING DISP NPWT PICO 4X12 (MISCELLANEOUS) ×2 IMPLANT
DRSG OPSITE POSTOP 4X10 (GAUZE/BANDAGES/DRESSINGS) ×2 IMPLANT
DURAPREP 26ML APPLICATOR (WOUND CARE) ×2 IMPLANT
ELECT REM PT RETURN 9FT ADLT (ELECTROSURGICAL) ×2
ELECTRODE REM PT RTRN 9FT ADLT (ELECTROSURGICAL) ×1 IMPLANT
EVACUATOR SILICONE 100CC (DRAIN) ×2 IMPLANT
EXTRACTOR VACUUM KIWI (MISCELLANEOUS) IMPLANT
GLOVE BIO SURGEON ST LM GN SZ9 (GLOVE) ×2 IMPLANT
GLOVE BIOGEL PI IND STRL 9 (GLOVE) ×1 IMPLANT
GLOVE BIOGEL PI INDICATOR 9 (GLOVE) ×1
GOWN STRL REUS W/TWL 2XL LVL3 (GOWN DISPOSABLE) ×2 IMPLANT
GOWN STRL REUS W/TWL LRG LVL3 (GOWN DISPOSABLE) ×2 IMPLANT
HEMOSTAT SURGICEL 2X14 (HEMOSTASIS) ×2 IMPLANT
NEEDLE HYPO 22GX1.5 SAFETY (NEEDLE) ×2 IMPLANT
NEEDLE HYPO 25X5/8 SAFETYGLIDE (NEEDLE) ×2 IMPLANT
NS IRRIG 1000ML POUR BTL (IV SOLUTION) ×2 IMPLANT
PACK C SECTION WH (CUSTOM PROCEDURE TRAY) ×2 IMPLANT
PAD OB MATERNITY 4.3X12.25 (PERSONAL CARE ITEMS) ×2 IMPLANT
PENCIL SMOKE EVAC W/HOLSTER (ELECTROSURGICAL) ×2 IMPLANT
RETAINER VISCERAL (MISCELLANEOUS) ×2 IMPLANT
RTRCTR C-SECT PINK 25CM LRG (MISCELLANEOUS) IMPLANT
RTRCTR C-SECT PINK 34CM XLRG (MISCELLANEOUS) IMPLANT
SPONGE DRAIN TRACH 4X4 STRL 2S (GAUZE/BANDAGES/DRESSINGS) ×2 IMPLANT
STAPLER VISISTAT 35W (STAPLE) ×2 IMPLANT
STRIP CLOSURE SKIN 1/2X4 (GAUZE/BANDAGES/DRESSINGS) IMPLANT
SUT MNCRL 0 VIOLET CTX 36 (SUTURE) ×2 IMPLANT
SUT MONOCRYL 0 CTX 36 (SUTURE) ×2
SUT PDS AB 0 CTX 60 (SUTURE) ×2 IMPLANT
SUT PLAIN 2 0 XLH (SUTURE) ×4 IMPLANT
SUT VIC AB 0 CT1 27 (SUTURE) ×1
SUT VIC AB 0 CT1 27XBRD ANBCTR (SUTURE) ×1 IMPLANT
SUT VIC AB 0 CTX 36 (SUTURE) ×1
SUT VIC AB 0 CTX36XBRD ANBCTRL (SUTURE) ×1 IMPLANT
SUT VIC AB 2-0 CT1 27 (SUTURE) ×1
SUT VIC AB 2-0 CT1 TAPERPNT 27 (SUTURE) ×1 IMPLANT
SUT VIC AB 4-0 KS 27 (SUTURE) ×2 IMPLANT
SYR BULB IRRIGATION 50ML (SYRINGE) IMPLANT
SYR CONTROL 10ML LL (SYRINGE) ×2 IMPLANT
TOWEL OR 17X24 6PK STRL BLUE (TOWEL DISPOSABLE) ×2 IMPLANT
TRAY FOLEY CATH SILVER 14FR (SET/KITS/TRAYS/PACK) ×2 IMPLANT

## 2014-12-18 NOTE — Transfer of Care (Signed)
Immediate Anesthesia Transfer of Care Note  Patient: Kayla GarbeEbony Flynn  Procedure(s) Performed: Procedure(s): CESAREAN SECTION (N/A)  Patient Location: PACU  Anesthesia Type:Epidural  Level of Consciousness: awake, alert , oriented and patient cooperative  Airway & Oxygen Therapy: Patient Spontanous Breathing  Post-op Assessment: Report given to RN and Post -op Vital signs reviewed and stable  Post vital signs: Reviewed and stable  Last Vitals:  Filed Vitals:   12/18/14 0909  BP: 135/62  Pulse: 95  Temp:   Resp:     Complications: No apparent anesthesia complications

## 2014-12-18 NOTE — Progress Notes (Signed)
POC for C/S initiated. 

## 2014-12-18 NOTE — Anesthesia Postprocedure Evaluation (Signed)
  Anesthesia Post-op Note  Patient: Kayla Flynn  Procedure(s) Performed: Procedure(s): CESAREAN SECTION (N/A)  Patient Location: PACU  Anesthesia Type:Epidural  Level of Consciousness: awake, alert  and oriented  Airway and Oxygen Therapy: Patient Spontanous Breathing  Post-op Pain: mild  Post-op Assessment: Post-op Vital signs reviewed, Patient's Cardiovascular Status Stable, Respiratory Function Stable and Patent Airway              Post-op Vital Signs: Reviewed and stable  Last Vitals:  Filed Vitals:   12/18/14 1215  BP:   Pulse: 90  Temp:   Resp: 6    Complications: No apparent anesthesia complications

## 2014-12-18 NOTE — Op Note (Signed)
Miciah Croker PROCEDURE DATE: 12/18/2014  PREOPERATIVE DIAGNOSES: Intrauterine pregnancy at [redacted]w[redacted]d weeks gestation; failure to progress: arrest of dilation; morbid obesity with BMI of 80; chronic hypertension with superimposed severe preeclampsia; class B DM  POSTOPERATIVE DIAGNOSES: The same  PROCEDURE: Primary Low Transverse Cesarean Section vis supraumbilical vertical incision  SURGEON:  Dr. Jaynie Collins  ASSISTANTS:  Dr. Shonna Chock; Karmen Stabs  RNFA Student  ANESTHESIOLOGIST: Dr. Sebastian Ache, Sr  INDICATIONS: Kayla Flynn is a 24 y.o. G1P1001 at [redacted]w[redacted]d here for cesarean section secondary to the indications listed under preoperative diagnoses; please see preoperative notes for further details.  The risks of cesarean section were discussed with the patient including but were not limited to: bleeding which may require transfusion or reoperation; infection which may require antibiotics; injury to bowel, bladder, ureters or other surrounding organs; injury to the fetus; need for additional procedures including hysterectomy in the event of a life-threatening hemorrhage; placental abnormalities wth subsequent pregnancies, incisional problems, thromboembolic phenomenon and other postoperative/anesthesia complications.   The patient concurred with the proposed plan, giving informed written consent for the procedure.    FINDINGS:  Viable female infant in cephalic presentation.  Apgars 1/4/7; arterial cord pH 7.06.  Clear amniotic fluid.  Intact anterior placenta, three vessel cord.  Normal uterus, fallopian tubes and ovaries bilaterally. Mild adhesive disease involving right side of uterus and adnexa; corresponding to previous appendectomy.  ANESTHESIA: Epidural INTRAVENOUS FLUIDS: 700 ml ESTIMATED BLOOD LOSS: 700 ml URINE OUTPUT:  50 ml SPECIMENS: Placenta sent to pathology COMPLICATIONS: None immediate  PROCEDURE IN DETAIL:  The patient preoperatively received intravenous antibiotics and had  sequential compression devices applied to her lower extremities.  She was then taken to the operating room where the epidural anesthesia was dosed up to surgical level and was found to be adequate. She was then placed in a dorsal supine position with a leftward tilt, and prepped and draped in a sterile manner.  A foley catheter was already placed into her bladder and attached to constant gravity while in L&D.  After an adequate timeout was performed, a supraumbilical vertical skin incision was made with scalpel and carried through to the underlying layer of fascia. The fascial incision was extended superiorly and inferiorly with care given to avoid bowel injury.  The peritoneum was entered bluntly, and an Teacher, early years/pre was placed.  Attention was turned to the lower uterine segment where a low transverse hysterotomy was made with a scalpel and extended bilaterally bluntly.  The infant was successfully delivered with some difficulty given fetal station, the cord was clamped and cut, and the infant was handed over to the awaiting neonatology team. Uterine massage was then administered, and the placenta delivered intact with a three-vessel cord. The uterus was then cleared of clot and debris.  The hysterotomy was closed with 0 Vicryl in a running locked fashion, and an imbricating layer was also placed with 0 Vicryl.  Figure-of-eight 0 Vicryl serosal stitches were placed to help with hemostasis.  The pelvis was cleared of all clot and debris. Hemostasis was confirmed on all surfaces; Surgicel was placed on the repaired hysterotomy.  The peritoneum, muscles and fascia were reapproximated in mass fashion using 0 PDS in a running fashion.  The subcutaneous layer was irrigated, and a JP drain was placed to help prevent seroma formation and brought out on the right side of the incision.  The drain was secured with silk suture and attached to bulb suction.   The subcutaneous layer was also  reapproximated with 2-0 plain gut  interrupted stitches, and 30 ml of 0.5% Marcaine was injected subcutaneously around the incision.  The skin was closed with staples.  After the skin was closed, a PICO disposable negative pressure wound therapy device was placed over the incision.  The suction was activated at a pressure of 80mmHg.  The adhesive was affixed well and there were no leaks noted.  The patient tolerated the procedure well. Sponge, lap, instrument and needle counts were correct x 3.  She was taken to the recovery room in stable condition.     Jaynie CollinsUGONNA  Morad Tal, MD, FACOG Attending Obstetrician & Gynecologist Faculty Practice, [facility] [place]

## 2014-12-18 NOTE — Progress Notes (Signed)
Kayla Flynn is a 24 y.o. G1P0 at 1766w0d admitted for induction of labor due to Hypertension. Pt is morbidly obese with BMI 80.   Subjective: pt has remained stable thru the night, progressed to 6 /80/-2 by RN check at 2 am. She's now mentioning some rectal pressure sensation   Objective: BP 144/75 mmHg  Pulse 81  Temp(Src) 98.8 F (37.1 C) (Oral)  Resp 22  Ht 4\' 10"  (1.473 m)  Wt 171.913 kg (379 lb)  BMI 79.23 kg/m2  SpO2 100%  LMP 01/10/2014 (Approximate) I/O last 3 completed shifts: In: 7271 [P.O.:3809; I.V.:3462] Out: 8975 [Urine:8975] Total I/O In: 1215.8 [P.O.:640; I.V.:575.8] Out: 5150 [Urine:5150]  FHT:  FHR: 145 bpm, variability: moderate,  accelerations:  Present,  decelerations:  Absent UC:   regular, every 3-4 minutes SVE:   Dilation: 6 Effacement (%): 80 Station: -2 Exam by:: JDaley Coexam by me confirms that she is 6/80/-2 with moulding present. Cervix remained firm and I am able to stretch the cervix rim , particularly on left.  Labs: Lab Results  Component Value Date   WBC 11.7* 12/16/2014   HGB 11.1* 12/16/2014   HCT 34.4* 12/16/2014   MCV 94.2 12/16/2014   PLT 250 12/16/2014    Assessment / Plan: Induction of labor due to hypertension,  progressing well on pitocin  Pt now 4 hours without significant progress. Both mother and baby are stable Plan is to allow 2 more hours, and unless significant progress, will proceed to cesarean at that time. Repeat exam at 7:45 Labor: Progressing normally and on pitocin with FSE and IUPC Preeclampsia:  on magnesium sulfate Fetal Wellbeing:  Category I Pain Control:  Epidural I/D:  n/a Anticipated MOD:  uncertain will recheck cbc, cmet, and mag level., type and cross  Joscelyn Hardrick V 12/18/2014, 6:19 AM

## 2014-12-18 NOTE — Progress Notes (Signed)
Hovermat placed underneath pt

## 2014-12-18 NOTE — Progress Notes (Signed)
Kayla Flynn is a 24 y.o. G1P0 at 6975w0d admitted for [facility] severe pre[facility]ampsia. Pt is morbidly obese with BMI 80 and had Class B DM.   Subjective:   No cervical change for over six hours.  Patient is comfortable with epidural. Scheduled for cesarean section for arrest of cervical dilation.  Objective: BP 108/69 mmHg  Pulse 156  Temp(Src) 98.9 F (37.2 C) (Oral)  Resp 22  Ht 4\' 10"  (1.473 m)  Wt 379 lb (171.913 kg)  BMI 79.23 kg/m2  SpO2 100%  LMP 01/10/2014 (Approximate) I/O last 3 completed shifts: In: 5536.8 [P.O.:2949; I.V.:2587.8] Out: 4098111525 [Urine:11525] Total I/O In: 1200 [P.O.:800; I.V.:400] Out: 850 [Urine:850]  FHT:  FHR: 145 bpm, variability: moderate,  accelerations:  Absent,  decelerations:  Absent UC:   regular, every 3-4 minutes SVE:   Dilation: 6.5 Effacement (%): 90 Station: -1 Exam by:: Dr Emelda FearFerguson  Labs: Lab Results  Component Value Date   WBC 17.0* 12/18/2014   HGB 11.5* 12/18/2014   HCT 35.8* 12/18/2014   MCV 94.0 12/18/2014   PLT 227 12/18/2014   CBG (last 3)   Recent Labs  12/18/14 0026 12/18/14 0617 12/18/14 0801  GLUCAP 75 81 85    Assessment / Plan: Arrest of cervical dilation in the setting of CHTN, superimposed severe preeclampsia, Class B DM, BMI 80.  Evaluated patient's abdomen, will proceed with supraumbilical vertical incision.  May need subcutaneous JP placement after procedure.   The risks of cesarean section discussed with the patient included but were not limited to: bleeding which may require transfusion or reoperation; infection which may require antibiotics; injury to bowel, bladder, ureters or other surrounding organs; injury to the fetus; need for additional procedures including hysterectomy in the event of a life-threatening hemorrhage; placental abnormalities wth subsequent pregnancies, incisional problems, thromboembolic phenomenon and other postoperative/anesthesia complications. The patient  concurred with the proposed plan, giving informed written consent for the procedure.    Anesthesia and OR aware. Preoperative prophylactic antibiotics and SCDs ordered on call to the OR.  To OR when ready.    Kayla Flynn  [name], MD, FACOG Attending Obstetrician & Gynecologist, [facility] Faculty Practice, [facility]

## 2014-12-18 NOTE — Progress Notes (Signed)
Kayla Flynn is a 24 y.o. G1P0 at 2237w0d   Subjective: Comfortable w/ epidural  Objective: BP 144/75 mmHg  Pulse 81  Temp(Src) 98.8 F (37.1 C) (Oral)  Resp 22  Ht 4\' 10"  (1.473 m)  Wt 171.913 kg (379 lb)  BMI 79.23 kg/m2  SpO2 100%  LMP 01/10/2014 (Approximate) I/O last 3 completed shifts: In: 7271 [P.O.:3809; I.V.:3462] Out: 8975 [Urine:8975] Total I/O In: 1215.8 [P.O.:640; I.V.:575.8] Out: 5150 [Urine:5150]  FHT:  FHR: 140s bpm, variability: minimal ,  accelerations:  Abscent,  decelerations:  Absent UC:   regular, every 2-3 minutes, MVUs adequate x many hours now SVE:   Dilation: 6 Effacement (%): 80 Station: -2 Exam by:: JDaley- exam by me- unchanged  Labs: Lab Results  Component Value Date   WBC 11.7* 12/16/2014   HGB 11.1* 12/16/2014   HCT 34.4* 12/16/2014   MCV 94.2 12/16/2014   PLT 250 12/16/2014    Assessment / Plan: cHTN w/ pre-e and severe features DM-B  Dr Emelda FearFerguson aware- he will recheck pt at approx 6am and make plan of care Leaning towards C/S at this point  Cam HaiSHAW, KIMBERLY CNM 12/18/2014, 5:09 AM

## 2014-12-18 NOTE — Progress Notes (Signed)
Kayla Flynn is a 24 y.o. G1P0 at 4762w0d by LMP admitted for induction of labor due to Hypertension.  Subjective: Pt laboring slowly with continued difficulty achieving a consistent contraction pattern.   Objective: BP 144/86 mmHg  Pulse 83  Temp(Src) 98.8 F (37.1 C) (Oral)  Resp 20  Ht 4\' 10"  (1.473 m)  Wt 171.913 kg (379 lb)  BMI 79.23 kg/m2  SpO2 100%  LMP 01/10/2014 (Approximate) I/O last 3 completed shifts: In: 7271 [P.O.:3809; I.V.:3462] Out: 8975 [Urine:8975] Total I/O In: 1215.8 [P.O.:640; I.V.:575.8] Out: 2150 [Urine:2150]  FHT:  FHR: 150 bpm, variability: minimal ,  accelerations:  Abscent,  decelerations:  Absent UC:   irregular, every 6-8 minutes SVE:   Dilation: 6 Effacement (%): 80 Station: -2 Exam by:: JDaley  Labs: Lab Results  Component Value Date   WBC 11.7* 12/16/2014   HGB 11.1* 12/16/2014   HCT 34.4* 12/16/2014   MCV 94.2 12/16/2014   PLT 250 12/16/2014    Assessment / Plan: Protracted latent phase  Labor: progressing slightly, patient now has been noted to have changed by an RN who checked her earlier in induction, and this change is considered significant. Preeclampsia:  on magnesium sulfate Fetal Wellbeing:  Category I Pain Control:  Epidural I/D:  n/a Anticipated MOD:  uncertain  Joshia Kitchings V 12/18/2014, 1:59 AM

## 2014-12-18 NOTE — Lactation Note (Signed)
This note was copied from the chart of Kayla Thurnell Garbebony Barrales. Lactation Consultation Note  Patient Name: Kayla Flynn Reason for consult: Initial assessment;NICU baby  NICU baby 5 hours old, 891w0d gestation. Mom on Magnesium for PIH/Preeclampsia--so sleepy during teaching. Assisted mom to begin pumping, and female family member in room to assist with stopping the pump and cleaning supplies. Demonstrated to mom how to hand express with a small glistening of colostrum, and mom states that she saw a demonstration during breastfeeding class. Mom has large, pendulous breast and large, tough nipples. Fitted mom with #27 flange. Mom reports comfortable with flange. Enc mom to pump every 2-3 hours, sleeping tonight, and then starting to pump again in the morning with the goal of pumping 8 times for 15 minutes/24 hours. Mom given NICU booklet and LC brochure with review. Mom will need reinforcement of teaching.  Maternal Data Has patient been taught Hand Expression?: Yes Does the patient have breastfeeding experience prior to this delivery?: No  Feeding    LATCH Score/Interventions                      Lactation Tools Discussed/Used Pump Review: Setup, frequency, and cleaning;Milk Storage Initiated by:: JW Date initiated:: 12/18/14   Consult Status Consult Status: Follow-up Date: 12/19/14 Follow-up type: In-patient    Geralynn OchsWILLIARD, [name], 3:59 PM

## 2014-12-19 ENCOUNTER — Encounter (HOSPITAL_COMMUNITY): Payer: Self-pay | Admitting: Obstetrics & Gynecology

## 2014-12-19 LAB — CBC
HEMATOCRIT: 31 % — AB (ref 36.0–46.0)
Hemoglobin: 10 g/dL — ABNORMAL LOW (ref 12.0–15.0)
MCH: 30.3 pg (ref 26.0–34.0)
MCHC: 32.3 g/dL (ref 30.0–36.0)
MCV: 93.9 fL (ref 78.0–100.0)
PLATELETS: 200 10*3/uL (ref 150–400)
RBC: 3.3 MIL/uL — AB (ref 3.87–5.11)
RDW: 13.9 % (ref 11.5–15.5)
WBC: 17.8 10*3/uL — AB (ref 4.0–10.5)

## 2014-12-19 LAB — GLUCOSE, CAPILLARY
GLUCOSE-CAPILLARY: 108 mg/dL — AB (ref 65–99)
GLUCOSE-CAPILLARY: 113 mg/dL — AB (ref 65–99)
Glucose-Capillary: 75 mg/dL (ref 65–99)

## 2014-12-19 MED ORDER — IBUPROFEN 600 MG PO TABS
600.0000 mg | ORAL_TABLET | Freq: Three times a day (TID) | ORAL | Status: DC
  Administered 2014-12-19 – 2014-12-21 (×7): 600 mg via ORAL
  Filled 2014-12-19 (×7): qty 1

## 2014-12-19 MED ORDER — SODIUM CHLORIDE 0.9 % IJ SOLN
3.0000 mL | INTRAMUSCULAR | Status: DC | PRN
Start: 2014-12-19 — End: 2014-12-21

## 2014-12-19 MED ORDER — SIMETHICONE 80 MG PO CHEW
80.0000 mg | CHEWABLE_TABLET | Freq: Two times a day (BID) | ORAL | Status: DC
  Administered 2014-12-19 – 2014-12-20 (×4): 80 mg via ORAL
  Filled 2014-12-19 (×5): qty 1

## 2014-12-19 MED ORDER — METFORMIN HCL 500 MG PO TABS
500.0000 mg | ORAL_TABLET | Freq: Two times a day (BID) | ORAL | Status: DC
  Administered 2014-12-19 – 2014-12-21 (×5): 500 mg via ORAL
  Filled 2014-12-19 (×5): qty 1

## 2014-12-19 MED ORDER — SODIUM CHLORIDE 0.9 % IJ SOLN
3.0000 mL | Freq: Two times a day (BID) | INTRAMUSCULAR | Status: DC
  Administered 2014-12-19: 3 mL via INTRAVENOUS

## 2014-12-19 MED ORDER — IBUPROFEN 200 MG PO TABS
200.0000 mg | ORAL_TABLET | Freq: Three times a day (TID) | ORAL | Status: DC
  Filled 2014-12-19: qty 1

## 2014-12-19 MED ORDER — SODIUM CHLORIDE 0.9 % IV SOLN: 250.0000 mL | INTRAVENOUS | Status: DC | PRN

## 2014-12-19 MED ORDER — METFORMIN HCL 500 MG PO TABS
1000.0000 mg | ORAL_TABLET | Freq: Two times a day (BID) | ORAL | Status: DC
  Filled 2014-12-19: qty 2

## 2014-12-19 NOTE — Progress Notes (Signed)
Pt returned from NICU via w/c to [facility] Rm #[id number]. Report to be given to night shift RN.

## 2014-12-19 NOTE — Lactation Note (Signed)
This note was copied from the chart of Kayla Flynn [place]. Lactation Consultation Note  Patient Name: Kayla Flynn Karman ZOXWR'UToday's Date: 12/19/2014 Reason for consult: Follow-up assessment    With this mom of a [redacted] week gestation NICU baby, now 629 hours old. I increased mom to size 30 flanges with a better, more comfortable fit. I reviewed hand expression with mom, and she was able to collect about 1 ml. Mom was very excited to see this. I advised mom to add hand expression after each pumping, and once her milk comes in, to pump until she stops dripping, 15-30 minutes. I faxed WIC for mom to get a DEP. [facility] if discharged this weekend.    Maternal Data    Feeding Feeding Type: Formula Nipple Type: Slow - flow Length of feed: 10 min  LATCH Score/Interventions                      Lactation Tools Discussed/Used WIC Program: Yes (fax sent for DEP)   Consult Status Consult Status: Follow-up Date: 12/20/14 Follow-up type: In-patient    Alfred LevinsLee, Martino Tompson Anne 12/19/2014, 3:37 PM

## 2014-12-19 NOTE — Progress Notes (Signed)
UR chart review completed.  

## 2014-12-19 NOTE — Anesthesia Postprocedure Evaluation (Signed)
  Anesthesia Post-op Note  Patient: Kayla Flynn  Procedure(s) Performed: Procedure(s): CESAREAN SECTION (N/A)  Patient Location: ICU  Anesthesia Type:Epidural  Level of Consciousness: awake  Airway and Oxygen Therapy: Patient Spontanous Breathing  Post-op Pain: mild  Post-op Assessment: Patient's Cardiovascular Status Stable and Respiratory Function Stable              Post-op Vital Signs: stable  Last Vitals:  Filed Vitals:   12/19/14 0800  BP: 132/87  Pulse: 87  Temp:   Resp:     Complications: No apparent anesthesia complications

## 2014-12-19 NOTE — Progress Notes (Signed)
Post Partum Day 1/POD#1 Subjective:  Kayla Flynn is a 24 y.o. G1P1001 3782w0d s/p pLTCS for arrest of dilation.  No acute events overnight.  Pt denies problems with ambulating, voiding or po intake.  She denies nausea or vomiting. Pain is well controlled.  She has had flatus.  Lochia Small.  Plan for birth control is abstinence  Method of Feeding: breast  Denies HA, blurry vision, changes in vision, RUQ pain, nausea, vomiting. Describes productive cough beginning after surgery. Patient was not intubated during her surgery.  Objective: Blood pressure 141/78, pulse 87, temperature 98.8 F (37.1 C), temperature source Oral, resp. rate 18, height 4\' 9"  (1.448 m), weight 370 lb 1.9 oz (167.885 kg), last menstrual period 01/10/2014, SpO2 93 %, unknown if currently breastfeeding.  Physical Exam:  General: alert, cooperative and no distress Lochia:normal flow Chest: normal WOB Heart: Regular rate Abdomen: +BS, soft, mild TTP (appropriate) Uterine Fundus: firm DVT Evaluation: No evidence of DVT seen on physical exam. Incision: covered. Dressing clean and dry. PICO draining serosanguinous fluid (~1 oz total) Extremities: trace edema   Recent Labs  12/18/14 1200 12/19/14 0515  HGB 11.1* 10.0*  HCT 34.5* 31.0*   I/O last 3 completed shifts: In: 5447.8 [P.O.:2640; I.V.:2807.8] Out: 9780 [Urine:9025; Drains:55; Blood:700]    Assessment/Plan:  ASSESSMENT: Kayla Flynn is a 24 y.o. G1P1001 3382w0d s/p pLTCS for arrest of dilation   S/p C-section: expected blood loss. Continue PICO Breastfeeding and Contraception -Discussed with patient recommendations to have at least 18 months before she attempts to get pregnant for optimal scar healing. She is currently interested in  Continue routine PP care Breastfeeding support PRN  Type 2 DM, Class B:  Well controlled, HA1C 5.5 in August 2016. Fasting today 75. Restarted Metformin 500 BID  cHTN with severe features: BP 117-141/63-80. Continue mag until  10 AM. Ok to d/c foley   LOS: 4 days   Federico FlakeKimberly Niles Newton 12/19/2014, 7:34 AM

## 2014-12-20 LAB — GLUCOSE, CAPILLARY
Glucose-Capillary: 79 mg/dL (ref 65–99)
Glucose-Capillary: 96 mg/dL (ref 65–99)
Glucose-Capillary: 85 mg/dL (ref 65–99)

## 2014-12-20 MED ORDER — FUROSEMIDE 40 MG PO TABS
20.0000 mg | ORAL_TABLET | Freq: Two times a day (BID) | ORAL | Status: AC
  Administered 2014-12-20 – 2014-12-21 (×3): 20 mg via ORAL
  Filled 2014-12-20: qty 0.5
  Filled 2014-12-20: qty 1
  Filled 2014-12-20: qty 0.5

## 2014-12-20 NOTE — Progress Notes (Signed)
Subjective: Postpartum Day 2: Cesarean Delivery, Failure to progress/CPD. Morbid obesity BMI 80,  Patient reports a nonproductive cough. .  Pumping, baby in NICU, doing well. Mother interviewed in NICU, unable to examine  Objective: Vital signs in last 24 hours: Temp:  [98.4 F (36.9 C)-99.3 F (37.4 C)] 98.8 F (37.1 C) (10/15 0513) Pulse Rate:  [77-103] 80 (10/15 0513) Resp:  [16-20] 20 (10/15 0513) BP: (122-161)/(59-91) 141/59 mmHg (10/15 0513) SpO2:  [90 %-99 %] 98 % (10/15 0513) Weight:  [174.862 kg (385 lb 8 oz)] 174.862 kg (385 lb 8 oz) (10/15 0600), a 15 pound weight gain on same standing scale, compared to yesterday.  Physical Exam:  General: alert, cooperative and no distress Lochia: appropriate  Recent Labs  12/18/14 1200 12/19/14 0515  HGB 11.1* 10.0*  HCT 34.5* 31.0*    Assessment/Plan: Status post Cesarean section. Postoperative course complicated by fluid retention  Will give lasix 20 mg x 2 today. Tilda Burrow.  Keldon Lassen V 12/20/2014, 7:10 AM

## 2014-12-20 NOTE — Progress Notes (Signed)
Weight = 385 lbs 08 oz ( checked 2x)   ; 15 lbs weight gain from yesterday.  Lungs clear but diminished.  Denies shortness of breath.         Coughing yellowish phlegm, thin.  Able to ambulate in room with difficulty.  Philipp DeputyKim Shaw CNM notified via phone.  Informed this nurse [facility] about to make AM rounds.

## 2014-12-21 LAB — TYPE AND SCREEN
ABO/RH(D): O POS
ANTIBODY SCREEN: NEGATIVE
UNIT DIVISION: 0
Unit division: 0

## 2014-12-21 LAB — GLUCOSE, CAPILLARY: GLUCOSE-CAPILLARY: 82 mg/dL (ref 65–99)

## 2014-12-21 MED ORDER — OXYCODONE-ACETAMINOPHEN 5-325 MG PO TABS: 1.0000 | ORAL_TABLET | Freq: Four times a day (QID) | ORAL | Status: DC | PRN

## 2014-12-21 MED ORDER — LABETALOL HCL 200 MG PO TABS: 400.0000 mg | ORAL_TABLET | Freq: Two times a day (BID) | ORAL | Status: DC

## 2014-12-21 NOTE — Progress Notes (Signed)
Discharge teaching complete. Pt understood all information and did not have any questions. Pt ambulated down to NICU and will leave hospital after visiting baby.

## 2014-12-21 NOTE — Discharge Summary (Signed)
OB Discharge Summary     Patient Name: Kayla Flynn DOB: 02-13-1991 MRN: [id number]  Date of admission: 12/14/2014 Delivering MD: Verita Schneiders A   Date of discharge: 12/21/2014  Admitting diagnosis: 49 WKS, HEADACHE, NAUSEA, VOMITTING, CRAMPS Intrauterine pregnancy: [redacted]w[redacted]d    Secondary diagnosis: Chronic Hypertension with Superimposed Preeclampsia and Type II Diabetes Mellitus     Discharge diagnosis: Term Pregnancy Delivered                                                                                                Post partum procedures:   Augmentation: Pitocin  Complications: None  Hospital course:  Induction of Labor With Cesarean Section  24y.o. yo G1P1001 at 310w0das admitted to the hospital 12/14/2014 for induction of labor [facility] significant for failure to progress. The patient went for cesarean section due to Arrest of Dilation, and delivered a Viable infant,Membrane Rupture Time/Date: )9:00 AM ,12/17/2014   Details of operation can be found in separate operative Note.  Patient had an uncomplicated postpartum course. She is ambulating, tolerating a regular diet, passing flatus, and urinating well.  Patient is discharged home in stable condition on No discharge date for patient encounter.. Marland Kitchen                                   Physical exam  Filed Vitals:   12/20/14 1840 12/20/14 2129 12/20/14 2139 12/21/14 0522  BP: 132/81  153/67 144/67  Pulse: 97  103 96  Temp: 98.9 F (37.2 C)  99.6 F (37.6 C) 99.1 F (37.3 C)  TempSrc: Oral Oral Oral Oral  Resp: _0 Height:      Weight:    173.728 kg (383 lb)  SpO2: 98%  96% 99%   General: alert, cooperative and no distress Lochia: appropriate Uterine Fundus: firm Incision: Healing well with no significant drainage, Dressing is clean, dry, and intact DVT Evaluation: No evidence of DVT seen on physical exam. Labs: Lab Results  Component Value Date   WBC  17.8* 12/19/2014   HGB 10.0* 12/19/2014   HCT 31.0* 12/19/2014   MCV 93.9 12/19/2014   PLT 200 12/19/2014   CMP Latest Ref Rng 12/18/2014  Glucose 65 - 99 mg/dL 81  BUN 6 - 20 mg/dL <5(L)  Creatinine 0.44 - 1.00 mg/dL 0.82  Sodium 135 - 145 mmol/L 131(L)  Potassium 3.5 - 5.1 mmol/L 3.9  Chloride 101 - 111 mmol/L 101  CO2 22 - 32 mmol/L 24  Calcium 8.9 - 10.3 mg/dL 7.6(L)  Total Protein 6.5 - 8.1 g/dL 6.4(L)  Total Bilirubin 0.3 - 1.2 mg/dL 0.6  Alkaline Phos 38 - 126 U/L 204(H)  AST 15 - 41 U/L 50(H)  ALT 14 - 54 U/L 31    Discharge instruction: per After Visit Summary and "Baby and Me Booklet".  Medications:  Current facility-administered medications:  .  0.9 %  sodium chloride infusion, 250 mL, Intravenous, PRN, KiJuanita Craver  Ernestina Patches, MD .  acetaminophen (TYLENOL) tablet 650 mg, 650 mg, Oral, Q4H PRN, Osborne Oman, MD, 650 mg at 12/18/14 2208 .  witch hazel-glycerin (TUCKS) pad 1 application, 1 application, Topical, PRN **AND** dibucaine (NUPERCAINAL) 1 % rectal ointment 1 application, 1 application, Rectal, PRN, Ugonna A Anyanwu, MD .  diphenhydrAMINE (BENADRYL) injection 12.5 mg, 12.5 mg, Intravenous, Q4H PRN **OR** diphenhydrAMINE (BENADRYL) capsule 25 mg, 25 mg, Oral, Q4H PRN, Alexis Frock, MD .  diphenhydrAMINE (BENADRYL) capsule 25 mg, 25 mg, Oral, Q6H PRN, Laray Anger A Anyanwu, MD .  ferrous sulfate tablet 325 mg, 325 mg, Oral, BID WC, Ugonna A Anyanwu, MD, 325 mg at 12/20/14 1703 .  furosemide (LASIX) tablet 20 mg, 20 mg, Oral, BID, Jonnie Kind, MD, 20 mg at 12/20/14 1704 .  hydrALAZINE (APRESOLINE) injection 10 mg, 10 mg, Intravenous, Q2H PRN, Elberta Leatherwood, MD, 20 mg at 12/18/14 0702 .  ibuprofen (ADVIL,MOTRIN) tablet 600 mg, 600 mg, Oral, 3 times per day, Osborne Oman, MD, 600 mg at 12/21/14 0526 .  labetalol (NORMODYNE) tablet 400 mg, 400 mg, Oral, BID, Guss Bunde, MD, 400 mg at 12/20/14 2141 .  labetalol (NORMODYNE,TRANDATE) injection 20-40 mg, 20-40  mg, Intravenous, Q10 min PRN, Keitha Butte, CNM .  lanolin ointment 1 application, 1 application, Topical, PRN, Ugonna A Anyanwu, MD .  magnesium hydroxide (MILK OF MAGNESIA) suspension 30 mL, 30 mL, Oral, Q3 days PRN, Osborne Oman, MD .  measles, mumps and rubella vaccine (MMR) injection 0.5 mL, 0.5 mL, Subcutaneous, Once, Ugonna A Anyanwu, MD, 0.5 mL at 12/19/14 1000 .  menthol-cetylpyridinium (CEPACOL) lozenge 3 mg, 1 lozenge, Oral, Q2H PRN, Osborne Oman, MD, 3 mg at 12/19/14 1034 .  metFORMIN (GLUCOPHAGE) tablet 500 mg, 500 mg, Oral, BID WC, Caren Macadam, MD, 500 mg at 12/20/14 1703 .  nalbuphine (NUBAIN) injection 5 mg, 5 mg, Intravenous, Q4H PRN **OR** nalbuphine (NUBAIN) injection 5 mg, 5 mg, Subcutaneous, Q4H PRN, Alexis Frock, MD .  nalbuphine (NUBAIN) injection 5 mg, 5 mg, Intravenous, Once PRN **OR** nalbuphine (NUBAIN) injection 5 mg, 5 mg, Subcutaneous, Once PRN, Alexis Frock, MD .  naloxone [facility]) 2 mg in dextrose 5 % 250 mL infusion, 1-4 mcg/kg/hr, Intravenous, Continuous PRN, Alexis Frock, MD .  naloxone [facility]) injection 0.4 mg, 0.4 mg, Intravenous, PRN **AND** sodium chloride 0.9 % injection 3 mL, 3 mL, Intravenous, PRN, Alexis Frock, MD .  ondansetron [facility]) injection 4 mg, 4 mg, Intravenous, Q8H PRN, Alexis Frock, MD .  oxyCODONE-acetaminophen (PERCOCET/ROXICET) 5-325 MG per tablet 1 tablet, 1 tablet, Oral, Q4H PRN, Osborne Oman, MD, 1 tablet at 12/21/14 0410 .  oxyCODONE-acetaminophen (PERCOCET/ROXICET) 5-325 MG per tablet 2 tablet, 2 tablet, Oral, Q4H PRN, Osborne Oman, MD .  prenatal multivitamin tablet 1 tablet, 1 tablet, Oral, Q1200, Osborne Oman, MD, 1 tablet at 12/20/14 1200 .  scopolamine (TRANSDERM-SCOP) 1 MG/3DAYS 1.5 mg, 1 patch, Transdermal, Once, Alexis Frock, MD, 1.5 mg at 12/18/14 1209 .  senna-docusate (Senokot-S) tablet 2 tablet, 2 tablet, Oral, Q24H, Osborne Oman, MD, 2 tablet at 12/20/14 2142 .  simethicone  (MYLICON) chewable tablet 80 mg, 80 mg, Oral, PRN, Osborne Oman, MD .  simethicone (MYLICON) chewable tablet 80 mg, 80 mg, Oral, BID, Caren Macadam, MD, 80 mg at 12/20/14 2141 .  sodium chloride 0.9 % injection 3 mL, 3 mL, Intravenous, Q12H, Caren Macadam, MD, 3 mL at 12/19/14 1108 .  sodium chloride 0.9 %  injection 3 mL, 3 mL, Intravenous, PRN, Caren Macadam, MD .  Tdap (BOOSTRIX) injection 0.5 mL, 0.5 mL, Intramuscular, Once, Ugonna A Anyanwu, MD, 0.5 mL at 12/19/14 1000 .  zolpidem (AMBIEN) tablet 5 mg, 5 mg, Oral, QHS PRN, Osborne Oman, MD  Diet: carb modified diet  Activity: Advance as tolerated. Pelvic rest for 6 weeks.   Outpatient follow up:4 days remove staples  Postpartum contraception: Undecided  Newborn Data: Live born female  Birth Weight: 5 lb 15.9 oz (2720 g) APGAR: 1, 4  Baby Feeding: Breast Disposition:NICU   12/21/2014 Emeterio Reeve, MD

## 2014-12-21 NOTE — Discharge Instructions (Signed)

## 2014-12-21 NOTE — Discharge Summary (Signed)
OB Discharge Summary  Patient Name: Kayla Flynn DOB: 1990/06/20 MRN: [id number]  Date of admission: 12/14/2014 Delivering MD: Verita Schneiders A   Date of discharge: 12/21/2014  Admitting diagnosis: 47 WKS, HEADACHE, NAUSEA, VOMITTING, CRAMPS Intrauterine pregnancy: [redacted]w[redacted]d    Secondary diagnosis: Chronic Hypertension and Gestational Diabetes diet controlled (A1)     Discharge diagnosis: Term Pregnancy Delivered, CHTN and GDM A1                                                                                                Post partum procedures:none  Augmentation: Pitocin and Cytotec  Complications: None  Hospital course:  Induction of Labor With Cesarean Section  24y.o. yo G1P1001 at 353w0das admitted to the hospital 12/14/2014 for induction of labor. Patient had a labor course significant for arrest of 1st stage. The patient went for cesarean section due to Arrest of Dilation, and delivered a Viable infant.  Details of operation can be found in separate operative note.  Patient had an uncomplicated postpartum course. She is ambulating, tolerating a regular diet, passing flatus, and urinating well.  Patient is discharged home in stable condition on 12/21/2014.  Patient has DM and is being sent home on Metformin.  She has a PICO dressing in place and incisional staples.  The PICO will be removed on POD#7 and staples on POD#14.   Physical exam  Filed Vitals:   12/20/14 1840 12/20/14 2129 12/20/14 2139 12/21/14 0522  BP: 132/81  153/67 144/67  Pulse: 97  103 96  Temp: 98.9 F (37.2 C)  99.6 F (37.6 C) 99.1 F (37.3 C)  TempSrc: Oral Oral Oral Oral  Resp: _0 Height:      Weight:    383 lb (173.728 kg)  SpO2: 98%  96% 99%   General: alert, cooperative and no distress Lochia: appropriate Uterine Fundus: firm Incision: Healing well with no significant drainage, No significant erythema, Dressing is clean, dry, and intact DVT Evaluation: No evidence of DVT seen on  physical exam. Negative Homan's sign. No cords or calf tenderness. Labs: Lab Results  Component Value Date   WBC 17.8* 12/19/2014   HGB 10.0* 12/19/2014   HCT 31.0* 12/19/2014   MCV 93.9 12/19/2014   PLT 200 12/19/2014   CMP Latest Ref Rng 12/18/2014  Glucose 65 - 99 mg/dL 81  BUN 6 - 20 mg/dL <5(L)  Creatinine 0.44 - 1.00 mg/dL 0.82  Sodium 135 - 145 mmol/L 131(L)  Potassium 3.5 - 5.1 mmol/L 3.9  Chloride 101 - 111 mmol/L 101  CO2 22 - 32 mmol/L 24  Calcium 8.9 - 10.3 mg/dL 7.6(L)  Total Protein 6.5 - 8.1 g/dL 6.4(L)  Total Bilirubin 0.3 - 1.2 mg/dL 0.6  Alkaline Phos 38 - 126 U/L 204(H)  AST 15 - 41 U/L 50(H)  ALT 14 - 54 U/L 31    Discharge instruction: per After Visit Summary and "Baby and Me Booklet".  Medications:  Current facility-administered medications:  .  0.9 %  sodium chloride infusion, 250 mL, Intravenous, PRN, KiCaren Macadam  MD .  acetaminophen (TYLENOL) tablet 650 mg, 650 mg, Oral, Q4H PRN, Osborne Oman, MD, 650 mg at 12/18/14 2208 .  witch hazel-glycerin (TUCKS) pad 1 application, 1 application, Topical, PRN **AND** dibucaine (NUPERCAINAL) 1 % rectal ointment 1 application, 1 application, Rectal, PRN, Tayvien Kane A Jayvion Stefanski, MD .  diphenhydrAMINE (BENADRYL) injection 12.5 mg, 12.5 mg, Intravenous, Q4H PRN **OR** diphenhydrAMINE (BENADRYL) capsule 25 mg, 25 mg, Oral, Q4H PRN, Alexis Frock, MD .  diphenhydrAMINE (BENADRYL) capsule 25 mg, 25 mg, Oral, Q6H PRN, Laray Anger A Alicen Donalson, MD .  ferrous sulfate tablet 325 mg, 325 mg, Oral, BID WC, Miette Molenda A Emrys Mckamie, MD, 325 mg at 12/21/14 0734 .  hydrALAZINE (APRESOLINE) injection 10 mg, 10 mg, Intravenous, Q2H PRN, Elberta Leatherwood, MD, 20 mg at 12/18/14 0702 .  ibuprofen (ADVIL,MOTRIN) tablet 600 mg, 600 mg, Oral, 3 times per day, Osborne Oman, MD, 600 mg at 12/21/14 0526 .  labetalol (NORMODYNE) tablet 400 mg, 400 mg, Oral, BID, Guss Bunde, MD, 400 mg at 12/20/14 2141 .  labetalol (NORMODYNE,TRANDATE)  injection 20-40 mg, 20-40 mg, Intravenous, Q10 min PRN, Keitha Butte, CNM .  lanolin ointment 1 application, 1 application, Topical, PRN, Dakoda Bassette A Sigmond Patalano, MD .  magnesium hydroxide (MILK OF MAGNESIA) suspension 30 mL, 30 mL, Oral, Q3 days PRN, Osborne Oman, MD .  measles, mumps and rubella vaccine (MMR) injection 0.5 mL, 0.5 mL, Subcutaneous, Once, Zyanya Glaza A Eduardo Honor, MD, 0.5 mL at 12/19/14 1000 .  menthol-cetylpyridinium (CEPACOL) lozenge 3 mg, 1 lozenge, Oral, Q2H PRN, Osborne Oman, MD, 3 mg at 12/19/14 1034 .  metFORMIN (GLUCOPHAGE) tablet 500 mg, 500 mg, Oral, BID WC, Caren Macadam, MD, 500 mg at 12/21/14 0734 .  nalbuphine (NUBAIN) injection 5 mg, 5 mg, Intravenous, Q4H PRN **OR** nalbuphine (NUBAIN) injection 5 mg, 5 mg, Subcutaneous, Q4H PRN, Alexis Frock, MD .  nalbuphine (NUBAIN) injection 5 mg, 5 mg, Intravenous, Once PRN **OR** nalbuphine (NUBAIN) injection 5 mg, 5 mg, Subcutaneous, Once PRN, Alexis Frock, MD .  naloxone [facility]) 2 mg in dextrose 5 % 250 mL infusion, 1-4 mcg/kg/hr, Intravenous, Continuous PRN, Alexis Frock, MD .  naloxone [facility]) injection 0.4 mg, 0.4 mg, Intravenous, PRN **AND** sodium chloride 0.9 % injection 3 mL, 3 mL, Intravenous, PRN, Alexis Frock, MD .  ondansetron [facility]) injection 4 mg, 4 mg, Intravenous, Q8H PRN, Alexis Frock, MD .  oxyCODONE-acetaminophen (PERCOCET/ROXICET) 5-325 MG per tablet 1 tablet, 1 tablet, Oral, Q4H PRN, Osborne Oman, MD, 1 tablet at 12/21/14 0410 .  oxyCODONE-acetaminophen (PERCOCET/ROXICET) 5-325 MG per tablet 2 tablet, 2 tablet, Oral, Q4H PRN, Osborne Oman, MD .  prenatal multivitamin tablet 1 tablet, 1 tablet, Oral, Q1200, Osborne Oman, MD, 1 tablet at 12/20/14 1200 .  scopolamine (TRANSDERM-SCOP) 1 MG/3DAYS 1.5 mg, 1 patch, Transdermal, Once, Alexis Frock, MD, 1.5 mg at 12/18/14 1209 .  senna-docusate (Senokot-S) tablet 2 tablet, 2 tablet, Oral, Q24H, Osborne Oman, MD, 2 tablet at  12/20/14 2142 .  simethicone (MYLICON) chewable tablet 80 mg, 80 mg, Oral, PRN, Osborne Oman, MD .  simethicone (MYLICON) chewable tablet 80 mg, 80 mg, Oral, BID, Caren Macadam, MD, 80 mg at 12/20/14 2141 .  sodium chloride 0.9 % injection 3 mL, 3 mL, Intravenous, Q12H, Caren Macadam, MD, 3 mL at 12/19/14 1108 .  sodium chloride 0.9 % injection 3 mL, 3 mL, Intravenous, PRN, Caren Macadam, MD .  Tdap (BOOSTRIX) injection 0.5 mL, 0.5 mL, Intramuscular,  Once, Osborne Oman, MD, 0.5 mL at 12/19/14 1000 .  zolpidem (AMBIEN) tablet 5 mg, 5 mg, Oral, QHS PRN, Osborne Oman, MD  Diet: carb modified diet  Activity: Advance as tolerated. Pelvic rest for 6 weeks.   Outpatient follow up: The PICO will be removed on POD#7 and staples on POD#14.  Postpartum contraception: Undecided  Newborn Data: Live born female  Birth Weight: 5 lb 15.9 oz (2720 g) APGAR: 1, 4, 7  Baby Feeding: Breast Disposition:home with mother   12/21/2014 LAWSON, MARIE DARLENE, CNM     Attestation of Attending Supervision of [place] Provider (PA/CNM/NP): Evaluation and management procedures were performed by the Advanced Practice Provider under my supervision and collaboration.  I have reviewed the Advanced Practice Provider's note and chart, and I agree with the management and plan.  Verita Schneiders, MD, [place] Attending [place], [facility]

## 2014-12-21 NOTE — Lactation Note (Signed)
This note was copied from the chart of Kayla Thurnell [place]. Lactation Consultation Note  Patient Name: Kayla Flynn's Date: 12/21/2014 Reason for consult: Follow-up assessment;NICU baby;Other (Comment) (early term )  Mom pumping consistently and with EBM yield - 30 ml this am at consult with #30 Flange  And per mom comfortable. [facility] and [facility] due to funds.  Mom plans to pump with manual pump and use the DEBP in NICU when visiting baby .  LC showed mom how to use the DEBP set up manually. Also showed moms sister whom will be helping her at home. Sore nipple and engorgement prevention and tx reviewed.  LC will F[facility] pump [facility] , and mom plans to call in am. Mother informed of post-discharge support and given phone number to the lactation department, including services for phone call assistance; out-patient appointments; and breastfeeding support group. List of other breastfeeding resources in the community given in the handout. Encouraged mother to call for problems or concerns related to breastfeeding.   Maternal Data Has patient been taught Hand Expression?: Yes (per mom feels comfortable )  Feeding Feeding Type: Formula Nipple Type: Slow - flow Length of feed: 30 min  LATCH Score/Interventions                      Lactation Tools Discussed/Used Tools: Pump;Flanges Flange Size: 30 Breast pump type: Double-Electric Breast Pump WIC Program: Yes ([facility] )   Consult Status Consult Status: Complete    Kayla Flynn, Kayla Flynn 12/21/2014, 9:52 AM
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
## 2014-12-24 ENCOUNTER — Ambulatory Visit (HOSPITAL_COMMUNITY): Payer: Medicaid Other

## 2014-12-29 ENCOUNTER — Encounter: Payer: Self-pay | Admitting: *Deleted

## 2014-12-31 ENCOUNTER — Ambulatory Visit (HOSPITAL_COMMUNITY): Payer: Medicaid Other

## 2015-01-02 ENCOUNTER — Telehealth: Payer: Self-pay | Admitting: Obstetrics and Gynecology

## 2015-01-02 NOTE — Telephone Encounter (Signed)
Patient called into clinic requesting appointment to come in to remove staples. Patient delivered on Dec 21, 2014, and she stated she was told to come in 4 days after delivery. Informed patient to come in today, and if unable to arrive before closing to go to Maternity Admissions today to have them removed. Patient agreed she would come into clinic and if unable to obtain ride will go to MAU today.

## 2015-01-03 ENCOUNTER — Encounter (HOSPITAL_COMMUNITY): Payer: Self-pay | Admitting: *Deleted

## 2015-01-03 ENCOUNTER — Inpatient Hospital Stay (HOSPITAL_COMMUNITY)
Admission: AD | Admit: 2015-01-03 | Discharge: 2015-01-03 | Disposition: A | Discharge status: Home / Self Care | Payer: Medicaid Other | Source: Ambulatory Visit | Attending: Obstetrics & Gynecology | Admitting: Obstetrics & Gynecology

## 2015-01-03 DIAGNOSIS — Z87891 Personal history of nicotine dependence: Secondary | ICD-10-CM | POA: Diagnosis not present

## 2015-01-03 DIAGNOSIS — Z4802 Encounter for removal of sutures: Secondary | ICD-10-CM

## 2015-01-03 NOTE — Discharge Instructions (Signed)

## 2015-01-03 NOTE — MAU Provider Note (Signed)
History     CSN: [id number]  Arrival date and time: 01/03/15 4854   None     No chief complaint on file.  HPI  Kayla Flynn 24 y.o. G1P1001 2 weeks postpartum here for staple removal from C/S.   Past Medical History  Diagnosis Date  . Hypertension   . Obesity   . Prediabetes     "prediabetic; controlled w/diet" (07/17/2012)  . Acute appendicitis 07/18/2012  . Obesity, morbid, BMI 50 or higher ([place]) 07/18/2012  . GDM (gestational diabetes mellitus)     Past Surgical History  Procedure Laterality Date  . Appendectomy  07/17/2012  . Laparoscopic appendectomy N/A 07/17/2012    Procedure: APPENDECTOMY LAPAROSCOPIC;  Surgeon: Zenovia Jarred, MD;  Location: [place];  Service: General;  Laterality: N/A;  . Cesarean section N/A 12/18/2014    Procedure: CESAREAN SECTION;  Surgeon: Osborne Oman, MD;  Location: [facility];  Service: Obstetrics;  Laterality: N/A;    Family History  Problem Relation Age of Onset  . Hypertension Mother   . Diabetes Mother   . Hypertension Maternal Grandmother     Social History  Substance Use Topics  . Smoking status: Former Smoker -- 0.25 packs/day for 1.2 years    Types: Cigarettes    Quit date: 12/10/2014  . Smokeless tobacco: Former Systems developer    Quit date: 12/10/2014  . Alcohol Use: No     Comment: last time couple wks ago but not since finding out was pregnant    Allergies: No Known Allergies  Prescriptions prior to admission  Medication Sig Dispense Refill Last Dose  . ibuprofen (ADVIL,MOTRIN) 200 MG tablet Take 400 mg by mouth every 6 (six) hours as needed for mild pain.   prn  . labetalol (NORMODYNE) 200 MG tablet Take 2 tablets (400 mg total) by mouth 2 (two) times daily. 60 tablet 2 01/02/2015 at Unknown time  . metFORMIN (GLUCOPHAGE) 500 MG tablet Take 1 tablet (500 mg total) by mouth 2 (two) times daily with a meal. 60 tablet 3 01/02/2015 at Unknown time  . oxyCODONE-acetaminophen (PERCOCET/ROXICET) 5-325 MG tablet Take 1-2 tablets by  mouth every 6 (six) hours as needed (for pain scale 4-7). 40 tablet 0 01/03/2015 at Unknown time  . Prenatal Vit-Fe Fumarate-FA (PRENATAL COMPLETE) 14-0.4 MG TABS Take 1 tablet by mouth daily. 30 each 6 01/02/2015 at Unknown time  . ACCU-CHEK FASTCLIX LANCETS MISC Inject 1 each into the skin 4 (four) times daily. New DX GDM for testing 4 times daily DX O24.419 102 each 12   . Blood Glucose Monitoring Suppl (ACCU-CHEK AVIVA CONNECT) W/DEVICE KIT 1 each by Does not apply route 4 (four) times daily. DX O24.419 GDM for testing 4 times daily     . Blood Glucose Monitoring Suppl (ACCU-CHEK AVIVA CONNECT) W/DEVICE KIT 1 each by Does not apply route 4 (four) times daily. 1 kit 0   . Butalbital-APAP-Caffeine 50-325-40 MG per capsule Take 1-2 capsules by mouth every 6 (six) hours as needed for headache. (Patient not taking: Reported on 01/03/2015) 6 capsule 0 Not Taking at Unknown time  . glucose blood (ACCU-CHEK AVIVA) test strip DX O24.419 GDM for testing 4 times daily 100 each 12   . glucose blood (ACCU-CHEK SMARTVIEW) test strip New DX GDM for testing 4 times daily DX O24.419 100 each 12 11/25/2014 at Unknown time    Review of Systems  Constitutional: Negative for fever.  All other systems reviewed and are negative.  Physical Exam   Blood  pressure 102/63, pulse 99, temperature 98.2 F (36.8 C), temperature source Oral, resp. rate 20, unknown if currently breastfeeding.  Physical Exam  Nursing note and vitals reviewed. Constitutional: She is oriented to person, place, and time. She appears well-developed and well-nourished.  HENT:  Head: Normocephalic and atraumatic.  Cardiovascular: Normal rate and regular rhythm.   GI: Soft. There is no tenderness.  Vertical incision with staples  Neurological: She is alert and oriented to person, place, and time.  Skin: Skin is warm and dry.  Psychiatric: She has a normal mood and affect. Her behavior is normal. Judgment and thought content normal.    MAU  Course  Procedures  MDM Staples removed without difficulty  Incision without redness edema, intact Assessment and Plan  Staple Removal Discharge to home  [facility] [place] 01/03/2015, 10:11 AM

## 2015-01-03 NOTE — MAU Note (Signed)
Pt had C/S on 10/13, here for staple removal.

## 2015-01-21 ENCOUNTER — Telehealth: Payer: Self-pay | Admitting: *Deleted

## 2015-01-21 NOTE — Telephone Encounter (Signed)
Patient called and stated that her work needs a letter faxed to them stating when she is anticipated to return to work.

## 2015-01-22 ENCOUNTER — Encounter: Payer: Self-pay | Admitting: *Deleted

## 2015-01-22 NOTE — Telephone Encounter (Signed)
Called patient back and she stated that her work is wanting her to come back tomorrow but she is not yet 6 weeks postpartum. She requested that a letter be faxed to her employer stating her return to work date of 6 weeks post delivery. She can return around 01/30/15.

## 2015-01-27 ENCOUNTER — Encounter: Payer: Self-pay | Admitting: *Deleted

## 2015-01-27 NOTE — Telephone Encounter (Signed)
Called pt and informed her that a letter has been prepared for her employer. She can come by the office to pick up the letter today or tomorrow.

## 2015-01-28 ENCOUNTER — Ambulatory Visit (INDEPENDENT_AMBULATORY_CARE_PROVIDER_SITE_OTHER): Payer: Medicaid Other | Admitting: Family Medicine

## 2015-01-28 ENCOUNTER — Encounter: Payer: Self-pay | Admitting: Family Medicine

## 2015-01-28 VITALS — BP 164/87 | HR 81 | Temp 98.8°F

## 2015-01-28 DIAGNOSIS — Z283 Underimmunization status: Secondary | ICD-10-CM

## 2015-01-28 DIAGNOSIS — Z3202 Encounter for pregnancy test, result negative: Secondary | ICD-10-CM

## 2015-01-28 DIAGNOSIS — Z30011 Encounter for initial prescription of contraceptive pills: Secondary | ICD-10-CM | POA: Diagnosis not present

## 2015-01-28 DIAGNOSIS — Z2839 Other underimmunization status: Secondary | ICD-10-CM

## 2015-01-28 DIAGNOSIS — O09899 Supervision of other high risk pregnancies, unspecified trimester: Secondary | ICD-10-CM

## 2015-01-28 DIAGNOSIS — Z98891 History of uterine scar from previous surgery: Secondary | ICD-10-CM

## 2015-01-28 DIAGNOSIS — I10 Essential (primary) hypertension: Secondary | ICD-10-CM | POA: Diagnosis not present

## 2015-01-28 DIAGNOSIS — O169 Unspecified maternal hypertension, unspecified trimester: Secondary | ICD-10-CM

## 2015-01-28 LAB — POCT PREGNANCY, URINE: PREG TEST UR: NEGATIVE

## 2015-01-28 MED ORDER — ENALAPRIL MALEATE 10 MG PO TABS: 10.0000 mg | ORAL_TABLET | Freq: Every day | ORAL | Status: DC

## 2015-01-28 MED ORDER — NORGESTIMATE-ETH ESTRADIOL 0.25-35 MG-MCG PO TABS: 1.0000 | ORAL_TABLET | Freq: Every day | ORAL | Status: DC

## 2015-01-28 NOTE — Addendum Note (Signed)
Addended by: Geanie BerlinNEWTON, Kayveon Lennartz N on: 01/28/2015 05:00 PM   Modules accepted: Level of Service

## 2015-01-28 NOTE — Progress Notes (Addendum)
Patient ID: Kayla Flynn, female   DOB: 04/23/1990, 24 y.o.   MRN: [id number]  Subjective:     Kayla Flynn is a 24 y.o. female who presents for a postpartum visit. She is 6 week postpartum following a fundal vertical Cesarean section. I have fully reviewed the prenatal and intrapartum course. The delivery was at 37 gestational weeks. Outcome: primary cesarean section, low transverse incision and cesarean indication: chorioamnionitis, failure to progress: arrest of dilation and severe preeclampsia. Anesthesia: epidural. Postpartum course has been normal. Baby's course has been normal. Baby is feeding by bottle - Similac Advance. Bleeding no bleeding. Bowel function is normal. Bladder function is normal. Patient is not sexually active. Contraception method is OCP (estrogen/progesterone). Postpartum depression screening: negative.  The following portions of the patient's history were reviewed and updated as appropriate: allergies, current medications, past family history, past medical history, past social history, past surgical history and problem list.  Review of Systems Pertinent items are noted in HPI.   Objective:    BP 164/87 mmHg  Pulse 81  Temp(Src) 98.8 F (37.1 C)  Breastfeeding? No  General:  alert, cooperative and appears stated age   Breasts:  inspection negative, no nipple discharge or bleeding, no masses or nodularity palpable  Lungs: clear to auscultation bilaterally  Heart:  regular rate and rhythm, S1, S2 normal, no murmur, click, rub or gallop  Abdomen: soft, non-tender; bowel sounds normal; no masses,  no organomegaly   Vulva:  not evaluated  Vagina: not evaluated  Cervix:  not evaluated  Corpus: not examined  Adnexa:  not evaluated  Rectal Exam: Not performed.        Assessment:     6 week postpartum exam. Pap smear not done at today's visit.   Plan:    1. Contraception: OCP (estrogen/progesterone) 2. Postpartum mood: discussed if baby blues return she should come  back to care.  3. CHTN: stopped labetalol. Started enalapril  4. T2DM: continue metformin 5. Varicella non-immune: recommended going to health department for immunization 5. Follow up: PRN 

## 2015-01-28 NOTE — Progress Notes (Signed)
Patient ID: Kayla Flynn, female   DOB: [birth year], 24 y.o.   MRN: [id number]  Subjective:     Kayla Flynn is a 24 y.o. female who presents for a postpartum visit. She is 6 week postpartum following a fundal vertical Cesarean section. I have fully reviewed the prenatal and intrapartum course. The delivery was at 37 gestational weeks. Outcome: primary cesarean section, low transverse incision and cesarean indication: chorioamnionitis, failure to progress: arrest of dilation and severe preeclampsia. Anesthesia: epidural. Postpartum course has been normal. Baby's course has been normal. Baby is feeding by bottle - Similac Advance. Bleeding no bleeding. Bowel function is normal. Bladder function is normal. Patient is not sexually active. Contraception method is OCP (estrogen/progesterone). Postpartum depression screening: negative.  The following portions of the patient's history were reviewed and updated as appropriate: allergies, current medications, past family history, past medical history, past social history, past surgical history and problem list.  Review of Systems Pertinent items are noted in HPI.   Objective:    BP 164/87 mmHg  Pulse 81  Temp(Src) 98.8 F (37.1 C)  Breastfeeding? No  General:  alert, cooperative and appears stated age   Breasts:  inspection negative, no nipple discharge or bleeding, no masses or nodularity palpable  Lungs: clear to auscultation bilaterally  Heart:  regular rate and rhythm, S1, S2 normal, no murmur, click, rub or gallop  Abdomen: soft, non-tender; bowel sounds normal; no masses,  no organomegaly   Vulva:  not evaluated  Vagina: not evaluated  Cervix:  not evaluated  Corpus: not examined  Adnexa:  not evaluated  Rectal Exam: Not performed.        Assessment:     6 week postpartum exam. Pap smear not done at today's visit.   Plan:    1. Contraception: OCP (estrogen/progesterone) 2. Postpartum mood: discussed if baby blues return she should come  back to care.  3. CHTN: stopped labetalol. Started enalapril  4. T2DM: continue metformin 5. Varicella non-immune: recommended going to health department for immunization 5. Follow up: PRN

## 2015-05-13 ENCOUNTER — Emergency Department (HOSPITAL_COMMUNITY)
Admission: EM | Admit: 2015-05-13 | Discharge: 2015-05-13 | Disposition: A | Discharge status: Home / Self Care | Payer: Medicaid Other | Attending: Emergency Medicine | Admitting: Emergency Medicine

## 2015-05-13 ENCOUNTER — Encounter (HOSPITAL_COMMUNITY): Payer: Self-pay

## 2015-05-13 DIAGNOSIS — Z87891 Personal history of nicotine dependence: Secondary | ICD-10-CM | POA: Diagnosis not present

## 2015-05-13 DIAGNOSIS — Z8632 Personal history of gestational diabetes: Secondary | ICD-10-CM | POA: Diagnosis not present

## 2015-05-13 DIAGNOSIS — Z793 Long term (current) use of hormonal contraceptives: Secondary | ICD-10-CM | POA: Insufficient documentation

## 2015-05-13 DIAGNOSIS — R51 Headache: Secondary | ICD-10-CM | POA: Diagnosis present

## 2015-05-13 DIAGNOSIS — Z79899 Other long term (current) drug therapy: Secondary | ICD-10-CM | POA: Diagnosis not present

## 2015-05-13 DIAGNOSIS — Z8719 Personal history of other diseases of the digestive system: Secondary | ICD-10-CM | POA: Insufficient documentation

## 2015-05-13 DIAGNOSIS — Z3202 Encounter for pregnancy test, result negative: Secondary | ICD-10-CM | POA: Diagnosis not present

## 2015-05-13 DIAGNOSIS — I10 Essential (primary) hypertension: Secondary | ICD-10-CM | POA: Diagnosis not present

## 2015-05-13 LAB — I-STAT CHEM 8, ED
BUN: 10 mg/dL (ref 6–20)
CREATININE: 0.7 mg/dL (ref 0.44–1.00)
Calcium, Ion: 1.2 mmol/L (ref 1.12–1.23)
Chloride: 102 mmol/L (ref 101–111)
GLUCOSE: 120 mg/dL — AB (ref 65–99)
HCT: 46 % (ref 36.0–46.0)
HEMOGLOBIN: 15.6 g/dL — AB (ref 12.0–15.0)
Potassium: 4 mmol/L (ref 3.5–5.1)
Sodium: 142 mmol/L (ref 135–145)
TCO2: 25 mmol/L (ref 0–100)

## 2015-05-13 LAB — URINE MICROSCOPIC-ADD ON

## 2015-05-13 LAB — URINALYSIS, ROUTINE W REFLEX MICROSCOPIC
Bilirubin Urine: NEGATIVE
GLUCOSE, UA: NEGATIVE mg/dL
HGB URINE DIPSTICK: NEGATIVE
KETONES UR: NEGATIVE mg/dL
Leukocytes, UA: NEGATIVE
Nitrite: POSITIVE — AB
PROTEIN: 30 mg/dL — AB
Specific Gravity, Urine: 1.02 (ref 1.005–1.030)
pH: 6 (ref 5.0–8.0)

## 2015-05-13 LAB — POC URINE PREG, ED: Preg Test, Ur: NEGATIVE

## 2015-05-13 MED ORDER — ENALAPRIL MALEATE 10 MG PO TABS: 10.0000 mg | ORAL_TABLET | Freq: Every day | ORAL | Status: DC

## 2015-05-13 MED ORDER — ENALAPRIL MALEATE 10 MG PO TABS
10.0000 mg | ORAL_TABLET | Freq: Once | ORAL | Status: AC
  Administered 2015-05-13: 10 mg via ORAL
  Filled 2015-05-13: qty 1

## 2015-05-13 NOTE — Discharge Instructions (Signed)
Hypertension Hypertension, commonly called high blood pressure, is when the force of blood pumping through your arteries is too strong. Your arteries are the blood vessels that carry blood from your heart throughout your body. A blood pressure reading consists of a higher number over a lower number, such as 110/72. The higher number (systolic) is the pressure inside your arteries when your heart pumps. The lower number (diastolic) is the pressure inside your arteries when your heart relaxes. Ideally you want your blood pressure below 120/80. Hypertension forces your heart to work harder to pump blood. Your arteries may become narrow or stiff. Having untreated or uncontrolled hypertension can cause heart attack, stroke, kidney disease, and other problems. RISK FACTORS Some risk factors for high blood pressure are controllable. Others are not.  Risk factors you cannot control include:   Race. You may be at higher risk if you are African American.  Age. Risk increases with age.  Gender. Men are at higher risk than women before age 45 years. After age 65, women are at higher risk than men. Risk factors you can control include:  Not getting enough exercise or physical activity.  Being overweight.  Getting too much fat, sugar, calories, or salt in your diet.  Drinking too much alcohol. SIGNS AND SYMPTOMS Hypertension does not usually cause signs or symptoms. Extremely high blood pressure (hypertensive crisis) may cause headache, anxiety, shortness of breath, and nosebleed. DIAGNOSIS To check if you have hypertension, your health care provider will measure your blood pressure while you are seated, with your arm held at the level of your heart. It should be measured at least twice using the same arm. Certain conditions can cause a difference in blood pressure between your right and left arms. A blood pressure reading that is higher than normal on one occasion does not mean that you need treatment. If  it is not clear whether you have high blood pressure, you may be asked to return on a different day to have your blood pressure checked again. Or, you may be asked to monitor your blood pressure at home for 1 or more weeks. TREATMENT Treating high blood pressure includes making lifestyle changes and possibly taking medicine. Living a healthy lifestyle can help lower high blood pressure. You may need to change some of your habits. Lifestyle changes may include:  Following the DASH diet. This diet is high in fruits, vegetables, and whole grains. It is low in salt, red meat, and added sugars.  Keep your sodium intake below 2,300 mg per day.  Getting at least 30-45 minutes of aerobic exercise at least 4 times per week.  Losing weight if necessary.  Not smoking.  Limiting alcoholic beverages.  Learning ways to reduce stress. Your health care provider may prescribe medicine if lifestyle changes are not enough to get your blood pressure under control, and if one of the following is true:  You are 18-59 years of age and your systolic blood pressure is above 140.  You are 60 years of age or older, and your systolic blood pressure is above 150.  Your diastolic blood pressure is above 90.  You have diabetes, and your systolic blood pressure is over 140 or your diastolic blood pressure is over 90.  You have kidney disease and your blood pressure is above 140/90.  You have heart disease and your blood pressure is above 140/90. Your personal target blood pressure may vary depending on your medical conditions, your age, and other factors. HOME CARE INSTRUCTIONS    Have your blood pressure rechecked as directed by your health care provider.   Take medicines only as directed by your health care provider. Follow the directions carefully. Blood pressure medicines must be taken as prescribed. The medicine does not work as well when you skip doses. Skipping doses also puts you at risk for  problems.  Do not smoke.   Monitor your blood pressure at home as directed by your health care provider. SEEK MEDICAL CARE IF:   You think you are having a reaction to medicines taken.  You have recurrent headaches or feel dizzy.  You have swelling in your ankles.  You have trouble with your vision. SEEK IMMEDIATE MEDICAL CARE IF:  You develop a severe headache or confusion.  You have unusual weakness, numbness, or feel faint.  You have severe chest or abdominal pain.  You vomit repeatedly.  You have trouble breathing. MAKE SURE YOU:   Understand these instructions.  Will watch your condition.  Will get help right away if you are not doing well or get worse.   This information is not intended to replace advice given to you by your health care provider. Make sure you discuss any questions you have with your health care provider.   Document Released: 02/21/2005 Document Revised: 07/08/2014 Document Reviewed: 12/14/2012 Elsevier Interactive Patient Education 2016 [employer]  

## 2015-05-13 NOTE — ED Provider Notes (Signed)
CSN: [id number]     Arrival date & time 05/13/15  0119 History   First MD Initiated Contact with Patient 05/13/15 [phone]     Chief Complaint  Patient presents with  . Headache  . Hypertension     (Consider location/radiation/quality/duration/timing/severity/associated sxs/prior Treatment) HPI Comments: Patient presents to the emergency department for evaluation of elevated blood pressure. Patient reports that she had not been feeling well today. She noticed that she did have a headache. She checked her blood pressure and it was 180/110. Patient reports having high blood pressure with preeclampsia and her most recent pregnancy, but has not been taking her blood pressure medication. She did take one of the enalapril tablets at home and then check her blood pressure several times at 30 minute intervals, blood pressure did not improve. She is not expressing any chest pain. There is no vision change. There is no shortness of breath.  Patient is a 25 y.o. female presenting with headaches and hypertension.  Headache Hypertension Associated symptoms include headaches.    Past Medical History  Diagnosis Date  . Hypertension   . Obesity   . Prediabetes     "prediabetic; controlled w/diet" (07/17/2012)  . Acute appendicitis 07/18/2012  . Obesity, morbid, BMI 50 or higher (HCC) 07/18/2012  . GDM (gestational diabetes mellitus)   . Severe preeclampsia 12/10/2014   Past Surgical History  Procedure Laterality Date  . Appendectomy  07/17/2012  . Laparoscopic appendectomy N/A 07/17/2012    Procedure: APPENDECTOMY LAPAROSCOPIC;  Surgeon: Liz MaladyBurke E Thompson, MD;  [facility];  Service: General;  Laterality: N/A;  . Cesarean section N/A 12/18/2014    Procedure: CESAREAN SECTION;  Surgeon: Tereso NewcomerUgonna A Anyanwu, MD;  Location: [facility] ORS;  Service: Obstetrics;  Laterality: N/A;   Family History  Problem Relation Age of Onset  . Hypertension Mother   . Diabetes Mother   . Hypertension Maternal Grandmother     Social History  Substance Use Topics  . Smoking status: Former Smoker -- 0.25 packs/day for 1.2 years    Types: Cigarettes    Quit date: 12/10/2014  . Smokeless tobacco: Former NeurosurgeonUser    Quit date: 12/10/2014  . Alcohol Use: No     Comment: last time couple wks ago but not since finding out was pregnant   OB History    Gravida Para Term Preterm AB TAB SAB Ectopic Multiple Living   1 1 1       0 1     Review of Systems  Neurological: Positive for headaches.  All other systems reviewed and are negative.     Allergies  Review of patient's allergies indicates no known allergies.  Home Medications   Prior to Admission medications   Medication Sig Start Date End Date Taking? Authorizing Provider  acetaminophen (TYLENOL) 500 MG tablet Take 1,000 mg by mouth every 6 (six) hours as needed for mild pain, moderate pain or headache.    Yes Historical Provider, MD  enalapril (VASOTEC) 10 MG tablet Take 1 tablet (10 mg total) by mouth daily. 01/28/15  Yes Federico FlakeKimberly Niles Newton, MD  Butalbital-APAP-Caffeine [phone] MG per capsule Take 1-2 capsules by mouth every 6 (six) hours as needed for headache. Patient not taking: Reported on 05/13/2015 10/13/14   Federico FlakeKimberly Niles Newton, MD  norgestimate-ethinyl estradiol (ORTHO-CYCLEN,SPRINTEC,PREVIFEM) 0.25-35 MG-MCG tablet Take 1 tablet by mouth daily. Patient not taking: Reported on 05/13/2015 01/28/15   Federico FlakeKimberly Niles Newton, MD  Prenatal Vit-Fe Fumarate-FA (PRENATAL COMPLETE) 14-0.4 MG TABS Take 1 tablet by mouth  daily. Patient not taking: Reported on 05/13/2015 07/28/14   Rhona Raider Stinson, DO   BP 131/84 mmHg  Pulse 74  Temp(Src) 98.7 F (37.1 C) (Oral)  Resp 20  Ht 4' 10.75" (1.492 m)  Wt 336 lb (152.409 kg)  BMI 68.47 kg/m2  SpO2 93% Physical Exam  Constitutional: She is oriented to person, place, and time. She appears well-developed and well-nourished. No distress.  HENT:  Head: Normocephalic and atraumatic.  Right Ear: Hearing normal.   Left Ear: Hearing normal.  Nose: Nose normal.  Mouth/Throat: Oropharynx is clear and moist and mucous membranes are normal.  Eyes: Conjunctivae and EOM are normal. Pupils are equal, round, and reactive to light.  Neck: Normal range of motion. Neck supple.  Cardiovascular: Regular rhythm, S1 normal and S2 normal.  Exam reveals no gallop and no friction rub.   No murmur heard. Pulmonary/Chest: Effort normal and breath sounds normal. No respiratory distress. She exhibits no tenderness.  Abdominal: Soft. Normal appearance and bowel sounds are normal. There is no hepatosplenomegaly. There is no tenderness. There is no rebound, no guarding, no tenderness at McBurney's point and negative Murphy's sign. No hernia.  Musculoskeletal: Normal range of motion.  Neurological: She is alert and oriented to person, place, and time. She has normal strength. No cranial nerve deficit or sensory deficit. Coordination normal. GCS eye subscore is 4. GCS verbal subscore is 5. GCS motor subscore is 6.  Skin: Skin is warm, dry and intact. No rash noted. No cyanosis.  Psychiatric: She has a normal mood and affect. Her speech is normal and behavior is normal. Thought content normal.  Nursing note and vitals reviewed.   ED Course  Procedures (including critical care time) Labs Review Labs Reviewed  URINALYSIS, ROUTINE W REFLEX MICROSCOPIC (NOT AT [facility])    Imaging Review No results found. I have personally reviewed and evaluated these images and lab results as part of my medical decision-making.   EKG Interpretation None      MDM   Final diagnoses:  None  essential hypertension  Patient presents to the ER for evaluation of elevated blood pressure. Patient does have a history of hypertension during pregnancy, but has not been taking blood pressure medicine since she delivered 4 months ago. Blood pressure was markedly elevated at home. It was improved at arrival and continued to improve. Improvement was  likely secondary to the patient taking her enalapril prior to coming to the ER. Basic workup has not revealed any abnormality. There is no renal insufficiency.EKG is normal. Patient did have a headache initially but this has improved without intervention. Patient reassured, will require continued antihypertensive treatment and follow-up for recheck.    Gilda Crease, MD 05/13/15 [phone]

## 2015-05-13 NOTE — ED Notes (Signed)
Pt complains of having a headache and high blood pressure since yesterday, she states that she was prescribed some after she had a baby but didn't continue to take them

## 2016-03-20 IMAGING — US US OB DETAIL+14 WK
1 series · 12 of 28 positions shown · non-contrast
Comparison: none

[Series 1: us ob +14 all · 12 of 77 slices shown]
[im 3/77]
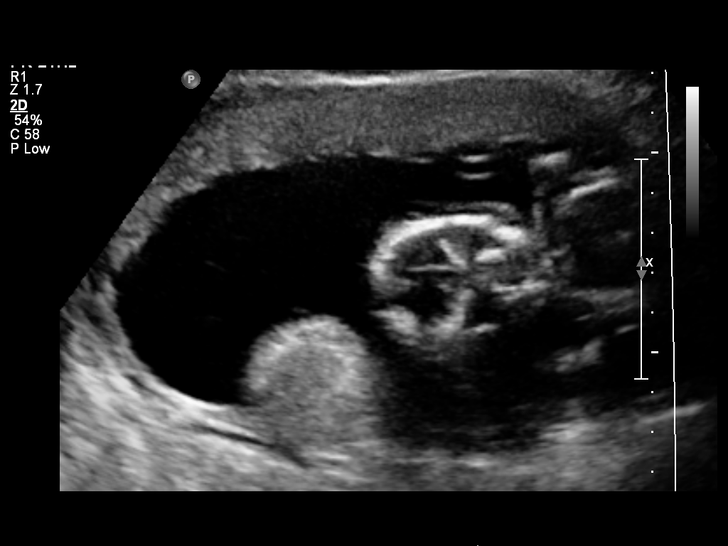
[im 9/77]
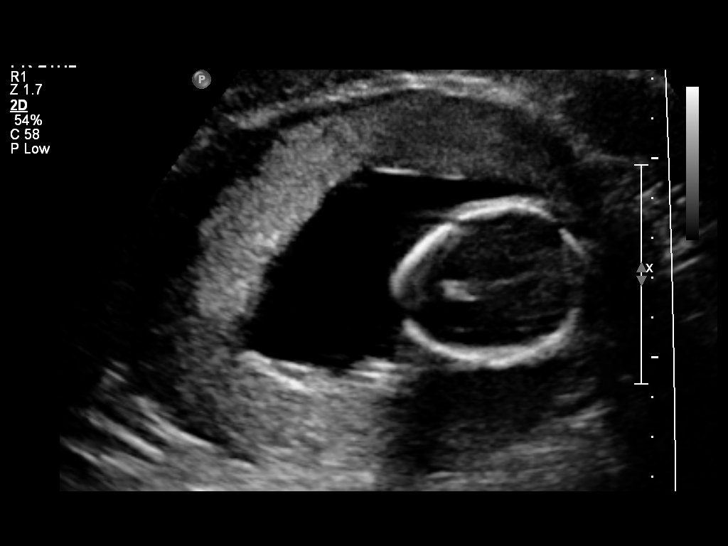
[im 15/77]
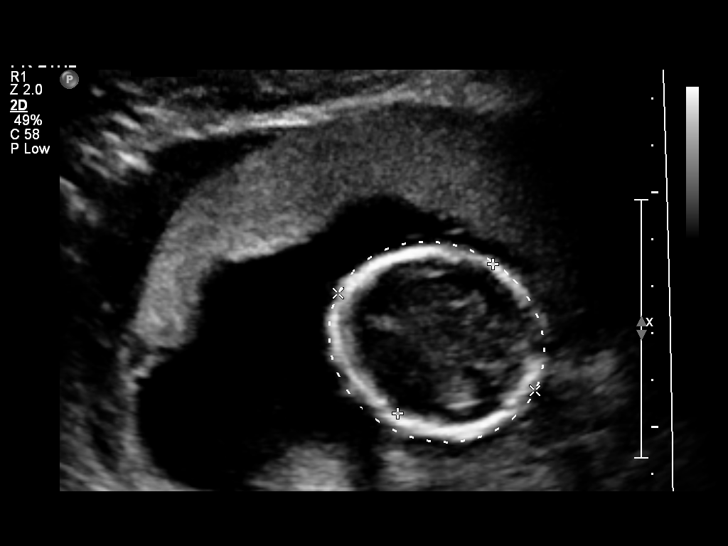
[im 23/77]
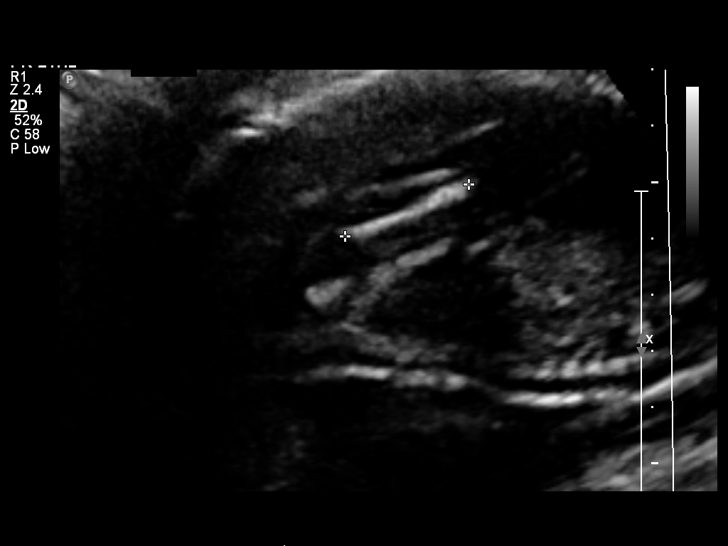
[im 29/77]
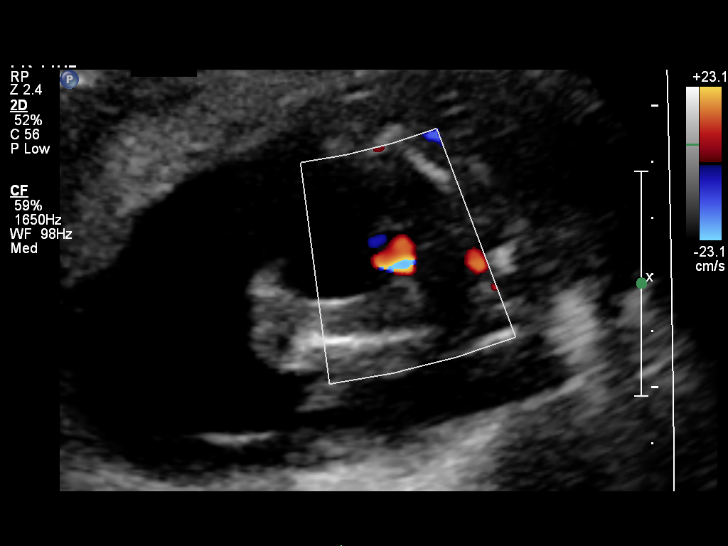
[im 34/77]
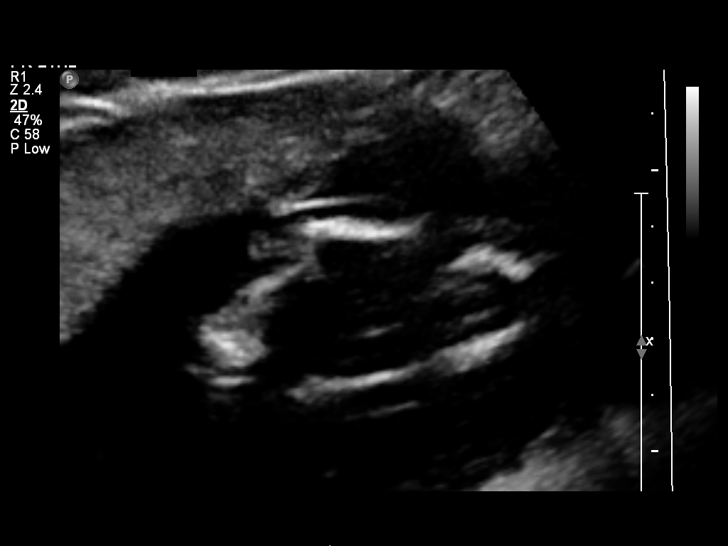
[im 43/77]
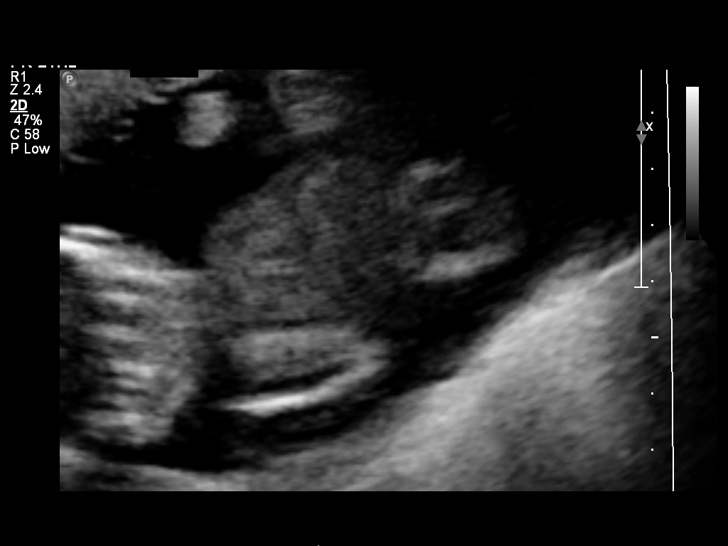
[im 48/77]
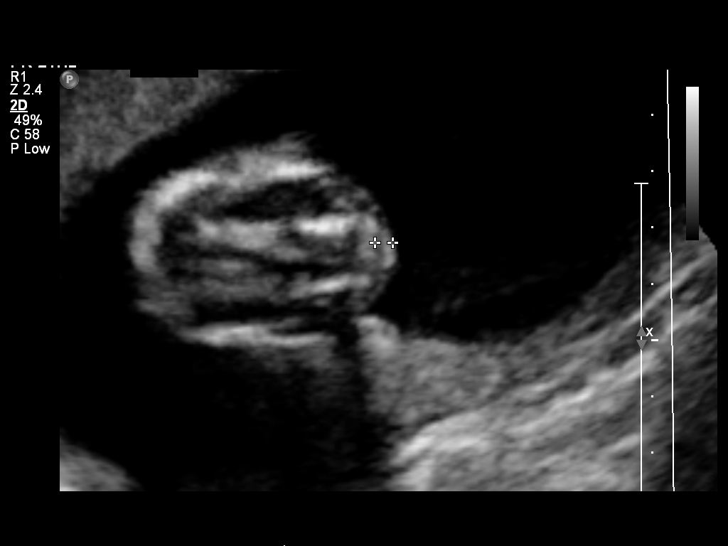
[im 54/77]
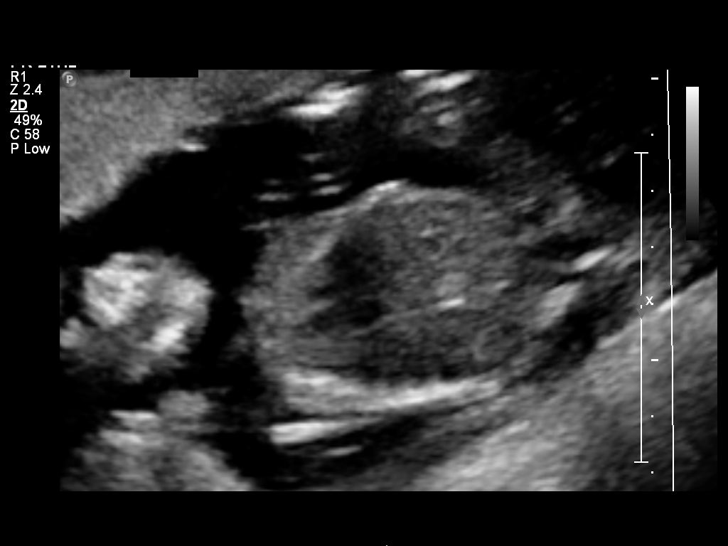
[im 62/77]
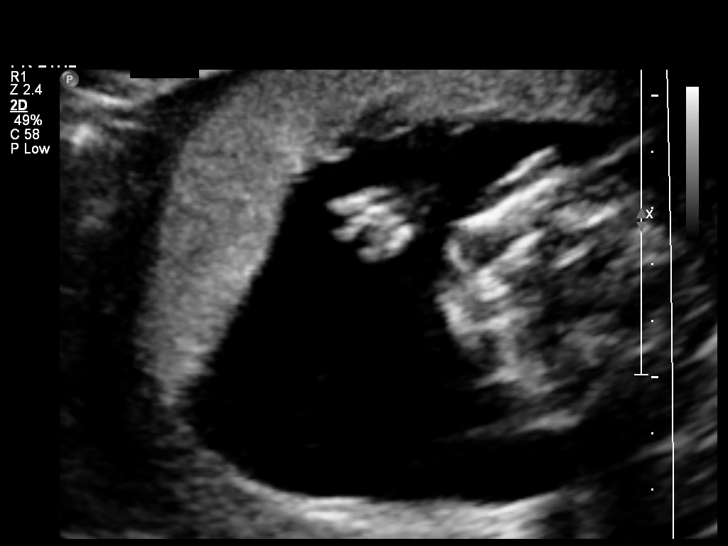
[im 68/77]
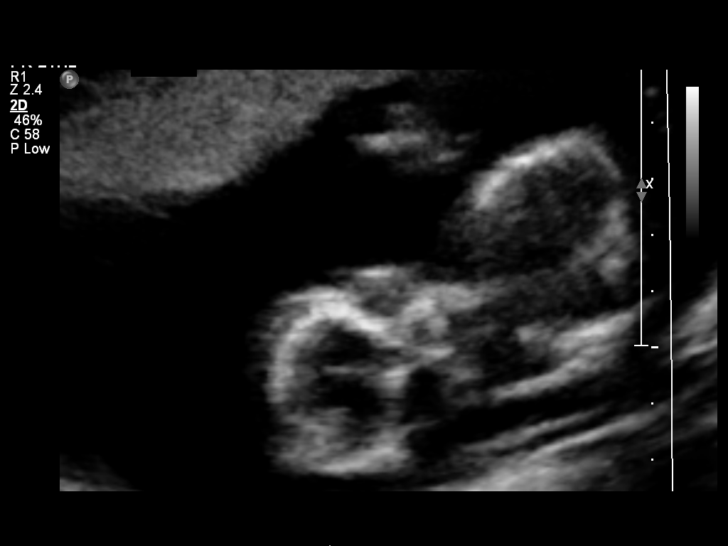
[im 74/77]
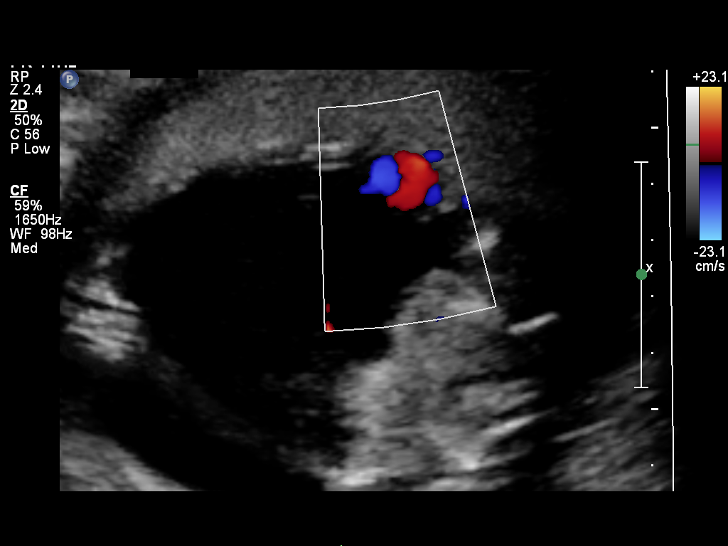

[12 of 28 positions shown; findings below may reference images not displayed]

OBSTETRICS REPORT
(Signed Final 08/07/2014 [DATE])

Service(s) Provided

US OB DETAIL + 14 WK                                  76811.0
Indications

Detailed fetal anatomic survey                        Z36
Maternal morbid obesity -  BMI
Hypertension - Chronic/Pre-existing - no meds
Diabetes - Pregestational, 2nd trimester - no meds
Fetal Evaluation

Num Of Fetuses:    1
Fetal Heart Rate:  156                          bpm
Cardiac Activity:  Observed
Presentation:      Breech
Placenta:          Anterior, above cervical os
P. Cord            Visualized, central
Insertion:

Amniotic Fluid
AFI FV:      Subjectively within normal limits
Larg Pckt:     5.7  cm
Biometry

BPD:     37.6  mm     G. Age:  17w 3d                CI:        73.91   70 - 86
FL/HC:      17.3   15.8 -
18
HC:     138.9  mm     G. Age:  17w 2d       11  %    HC/AC:      1.12   1.07 -
1.29
AC:     123.5  mm     G. Age:  18w 0d       47  %    FL/BPD:
FL:        24  mm     G. Age:  17w 1d       19  %    FL/AC:      19.4   20 - 24
HUM:     24.1  mm     G. Age:  17w 3d       40  %
CER:       18  mm     G. Age:  18w 0d       50  %
NFT:     3.15  mm

Est. FW:     201  gm      0 lb 7 oz     40  %
Gestational Age

U/S Today:     17w 3d                                        EDD:   01/12/15
Best:          18w 0d     Det. By:  Early Ultrasound         EDD:   01/08/15
(06/08/14)
Anatomy

Cranium:          Appears normal         Aortic Arch:      Not well visualized
Fetal Cavum:      Appears normal         Ductal Arch:      Not well visualized
Ventricles:       Appears normal         Diaphragm:        Appears normal
Choroid Plexus:   Appears normal         Stomach:          Appears normal, left
sided
Cerebellum:       Appears normal         Abdomen:          Appears normal
Posterior Fossa:  Appears normal         Abdominal Wall:   Appears nml (cord
insert, abd wall)
Nuchal Fold:      Appears normal         Cord Vessels:     Appears normal (3
vessel cord)
Face:             Appears normal         Kidneys:          Appear normal
(orbits and profile)
Lips:             Not well visualized    Bladder:          Appears normal
Heart:            Not well visualized    Spine:            Not well visualized
RVOT:             Not well visualized    Lower             Appears normal
Extremities:
LVOT:             Not well visualized    Upper             Appears normal
Extremities:

Other:  Technically difficult due to  maternal habitus. Heels and 5th digit
visualized. Male gender.
Targeted Anatomy

Fetal Central Nervous System
Lat. Ventricles:
Cervix Uterus Adnexa

Cervical Length:    3.97     cm

Cervix:       Normal appearance by transabdominal scan.
Left Ovary:    Previously seen.
Right Ovary:   Not visualized.
Impression

Single IUP at 18w 0d
Morbid obesity, CHTN, pre-existing type 2 diabetes not
currently on medications
Limited views of the heart, face and spine obtained
No gross anomalies noted
Ultrasound measurements consistent with dates
Anterior placenta without previa
Normal amniotic fluid volume
Recommendations

Recommend follow-up ultrasound examination in 6 weeks to
reevaluate the fetal anatomy
Would offer fetal echo at 22-24 weeks gestation due to pre-
existing diabetes

Thank you for sharing in the care of Ms. ROEL BISTA with
questions or concerns.

## 2016-07-28 IMAGING — US US MFM OB LIMITED
1 series · 12 of 12 positions shown · non-contrast
Comparison: none

[Series 1: us mfm ob limited · 12 of 12 slices shown]
[im 1/12]
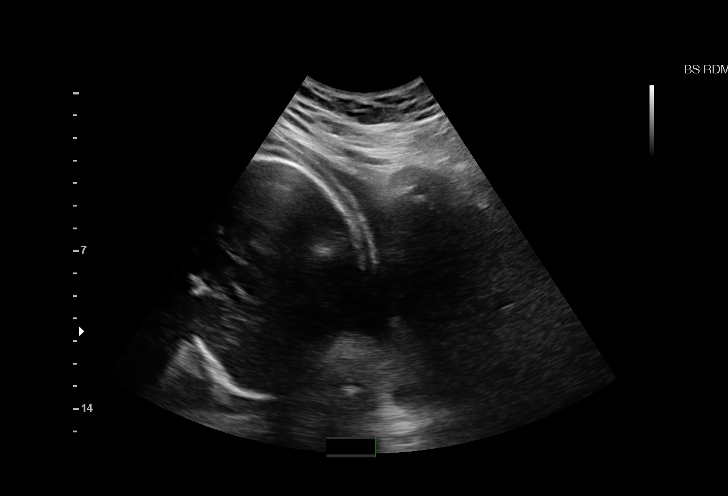
[im 2/12]
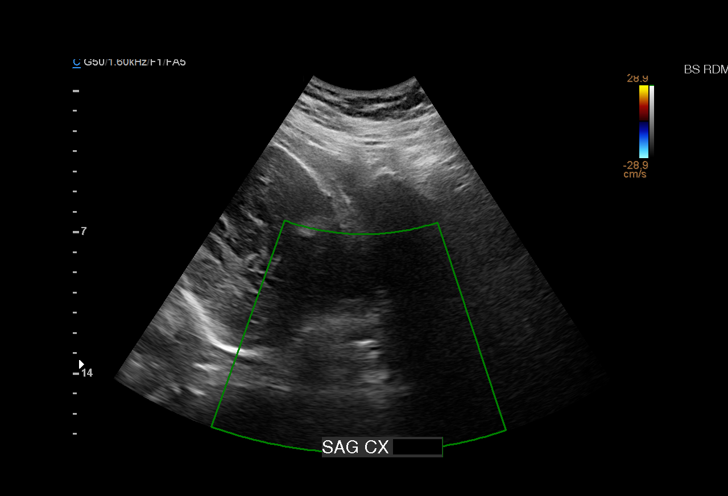
[im 3/12]
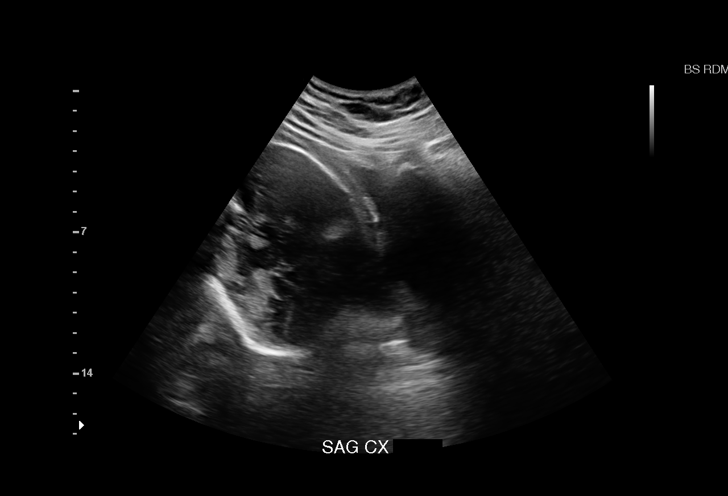
[im 4/12]
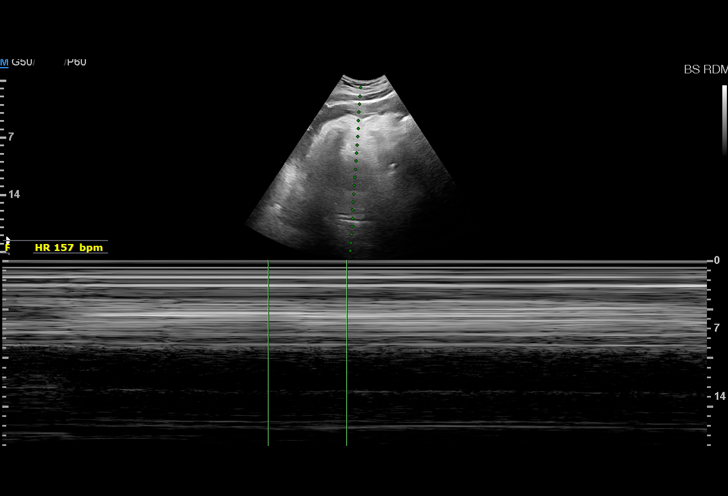
[im 5/12]
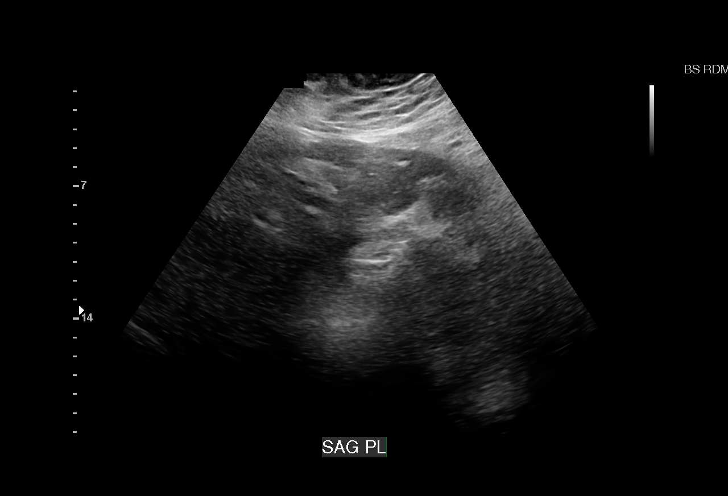
[im 6/12]
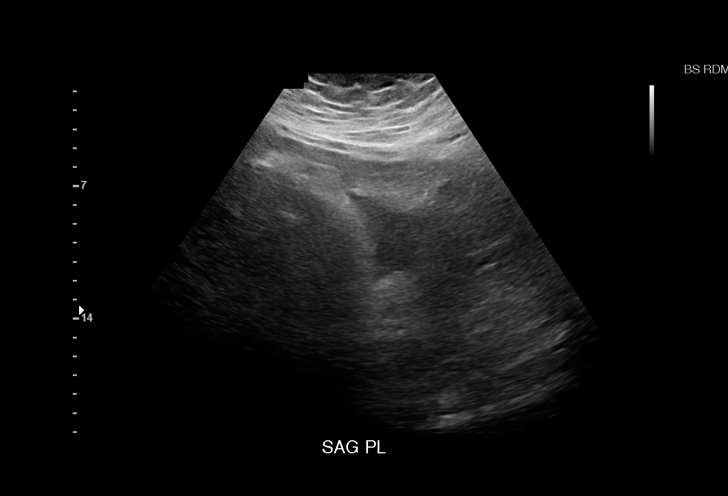
[im 7/12]
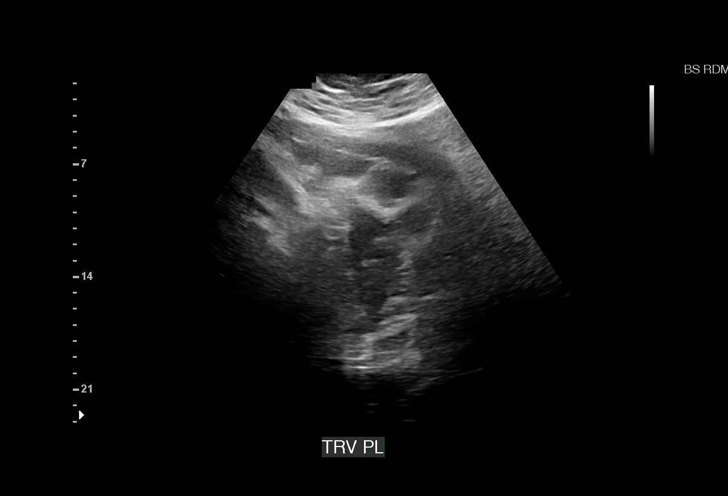
[im 8/12]
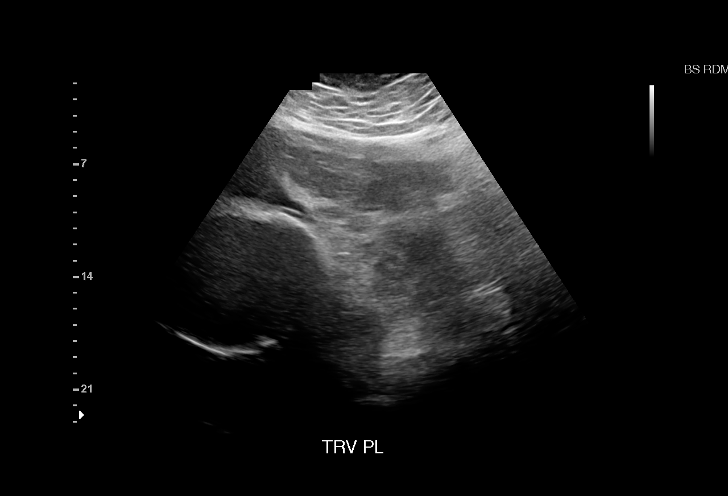
[im 9/12]
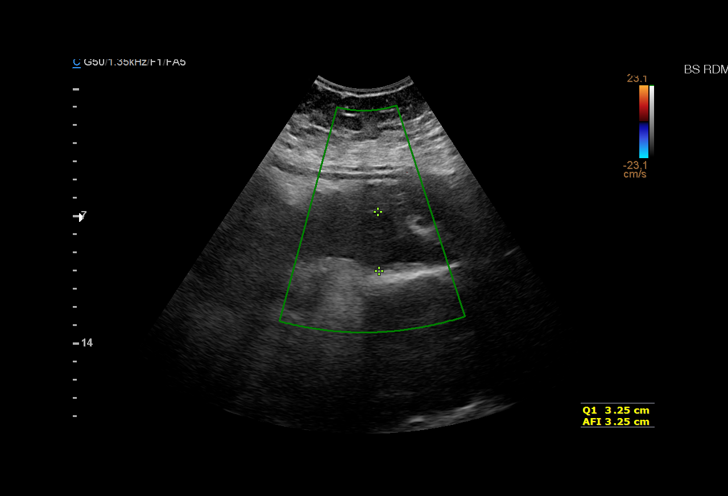
[im 10/12]
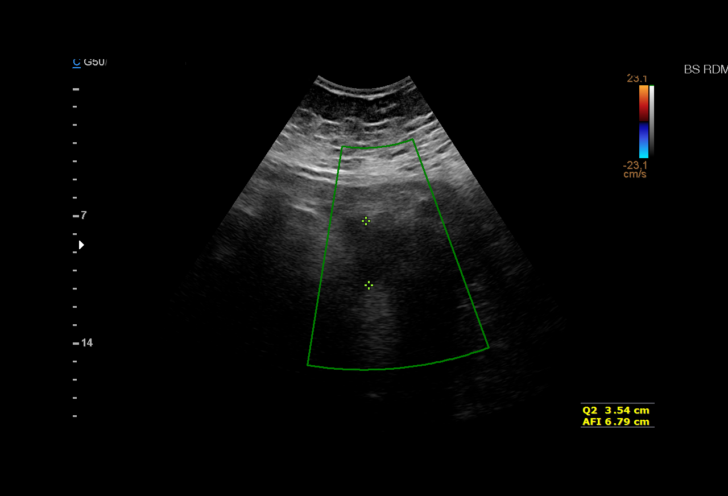
[im 11/12]
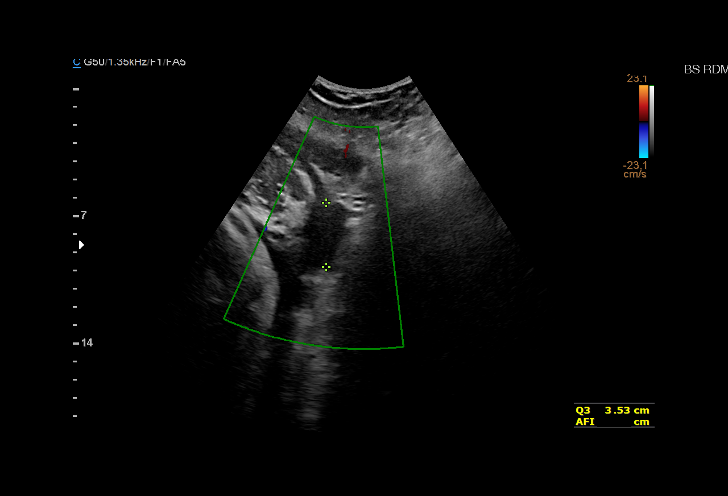
[im 12/12]
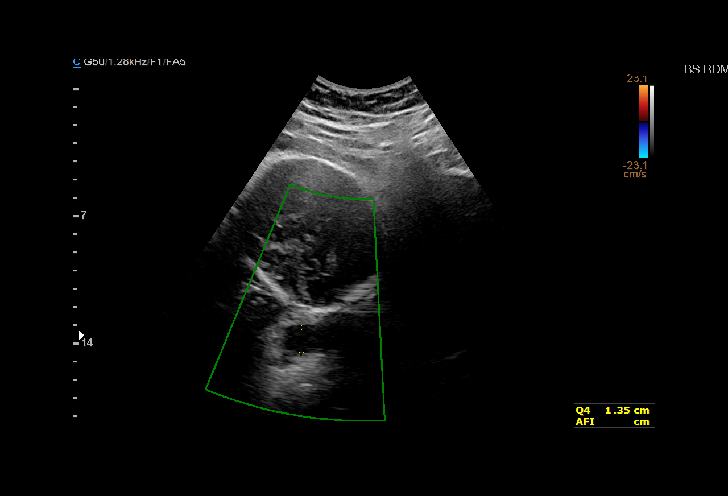

[12 of 12 positions shown; findings below may reference images not displayed]

OBSTETRICS REPORT
(Signed Final 12/17/2014 [DATE])

Date:

Service(s) Provided

US MFM OB LIMITED                                      76815.01
Indications

Maternal morbid obesity -  BMI
Unspecified preeclampsia, third trimester
Diabetes - Pregestational,3rd trimester
(metformin) -A1c
Determine fetal presentation using ultrasound          Z36
Smoking complicating pregnancy, third trimester
36 weeks gestation of pregnancy
Fetal Evaluation

Num Of             1
Fetuses:
Fetal Heart        157                          bpm
Rate:
Cardiac Activity:  Observed
Presentation:      Cephalic
Placenta:          Anterior, above cervical
os
P. Cord            Previously Visualized
Insertion:

Amniotic Fluid
AFI FV:      Subjectively within normal limits
AFI Sum:     11.67    cm      35  %Tile     Larg Pckt:    3.54   cm
RUQ:   3.25    cm    RLQ:   1.35    cm   LUQ:    3.54    cm   LLQ:    3.53   cm
Gestational Age

Best:          36w 4d    Det. By:   Early Ultrasound          EDD:   01/08/15
(06/08/14)
Cervix Uterus Adnexa

Cervix:       Not visualized (advanced GA >51wks)
Comments

Electronically signed for Dr. Haug by Dr. Zeinab
Impression

Singleton intrauterine pregnancy at 36 weeks 4 days
Cephalic presentation
Recommendations

Follow-up ultrasounds as clinically indicated.

## 2016-08-23 ENCOUNTER — Emergency Department (HOSPITAL_COMMUNITY)
Admission: EM | Admit: 2016-08-23 | Discharge: 2016-08-23 | Disposition: A | Discharge status: Home / Self Care | Payer: Medicaid Other | Attending: Emergency Medicine | Admitting: Emergency Medicine

## 2016-08-23 ENCOUNTER — Encounter (HOSPITAL_COMMUNITY): Payer: Self-pay | Admitting: Emergency Medicine

## 2016-08-23 DIAGNOSIS — Z87891 Personal history of nicotine dependence: Secondary | ICD-10-CM | POA: Diagnosis not present

## 2016-08-23 DIAGNOSIS — E119 Type 2 diabetes mellitus without complications: Secondary | ICD-10-CM | POA: Diagnosis not present

## 2016-08-23 DIAGNOSIS — I10 Essential (primary) hypertension: Secondary | ICD-10-CM | POA: Insufficient documentation

## 2016-08-23 DIAGNOSIS — N938 Other specified abnormal uterine and vaginal bleeding: Secondary | ICD-10-CM | POA: Diagnosis not present

## 2016-08-23 DIAGNOSIS — R1030 Lower abdominal pain, unspecified: Secondary | ICD-10-CM | POA: Diagnosis present

## 2016-08-23 LAB — WET PREP, GENITAL
CLUE CELLS WET PREP: NONE SEEN
Sperm: NONE SEEN
TRICH WET PREP: NONE SEEN
Yeast Wet Prep HPF POC: NONE SEEN

## 2016-08-23 LAB — COMPREHENSIVE METABOLIC PANEL
ALK PHOS: 68 U/L (ref 38–126)
ALT: 18 U/L (ref 14–54)
ANION GAP: 8 (ref 5–15)
AST: 19 U/L (ref 15–41)
Albumin: 4.1 g/dL (ref 3.5–5.0)
BILIRUBIN TOTAL: 0.4 mg/dL (ref 0.3–1.2)
BUN: 11 mg/dL (ref 6–20)
CALCIUM: 9.5 mg/dL (ref 8.9–10.3)
CO2: 27 mmol/L (ref 22–32)
Chloride: 106 mmol/L (ref 101–111)
Creatinine, Ser: 0.68 mg/dL (ref 0.44–1.00)
GFR calc non Af Amer: >60 mL/min (ref >60)
Glucose, Bld: 106 mg/dL — ABNORMAL HIGH (ref 65–99)
Potassium: 4.4 mmol/L (ref 3.5–5.1)
Sodium: 141 mmol/L (ref 135–145)
TOTAL PROTEIN: 7.9 g/dL (ref 6.5–8.1)

## 2016-08-23 LAB — CBC
HCT: 35.2 % — ABNORMAL LOW (ref 36.0–46.0)
HEMOGLOBIN: 11.4 g/dL — AB (ref 12.0–15.0)
MCH: 29.8 pg (ref 26.0–34.0)
MCHC: 32.4 g/dL (ref 30.0–36.0)
MCV: 91.9 fL (ref 78.0–100.0)
Platelets: 351 10*3/uL (ref 150–400)
RBC: 3.83 MIL/uL — AB (ref 3.87–5.11)
RDW: 13.5 % (ref 11.5–15.5)
WBC: 10.6 10*3/uL — AB (ref 4.0–10.5)

## 2016-08-23 LAB — URINALYSIS, ROUTINE W REFLEX MICROSCOPIC
Bilirubin Urine: NEGATIVE
GLUCOSE, UA: NEGATIVE mg/dL
KETONES UR: NEGATIVE mg/dL
Leukocytes, UA: NEGATIVE
Nitrite: NEGATIVE
PROTEIN: NEGATIVE mg/dL
Specific Gravity, Urine: 1.02 (ref 1.005–1.030)
WBC UA: NONE SEEN WBC/hpf (ref 0–5)
pH: 5 (ref 5.0–8.0)

## 2016-08-23 LAB — I-STAT BETA HCG BLOOD, ED (MC, WL, AP ONLY)

## 2016-08-23 LAB — LIPASE, BLOOD: Lipase: 29 U/L (ref 11–51)

## 2016-08-23 LAB — PREGNANCY, URINE: Preg Test, Ur: NEGATIVE

## 2016-08-23 MED ORDER — NAPROXEN 500 MG PO TABS
500.0000 mg | ORAL_TABLET | Freq: Two times a day (BID) | ORAL | 0 refills | Status: DC
Start: 2016-08-23 — End: 2017-03-29

## 2016-08-23 NOTE — Discharge Instructions (Signed)
Naprosyn for pain and bleeding. Please follow up with primary care doctor or OB/GYN if not improving or return if worsening symptoms.

## 2016-08-23 NOTE — ED Provider Notes (Signed)
[facility] Provider Note   CSN: [id number] Arrival date & time: 08/23/16  1450     History   Chief Complaint Chief Complaint  Patient presents with  . Abdominal Pain  . Vaginal Bleeding    HPI Kayla Flynn is a 26 y.o. female.  HPI Kayla Flynn is a 26 y.o. female with history of morbid obesity, prediabetes, presents to emergency department with complaint of lower abdominal pain and vaginal versus urine bleeding. Patient states that each time she urinated she has noticed blood in her urine, also has noticed blood when wiping. She is unsure if the blood is coming from urethra or vaginal canal. She states her last menstrual period was just 2 weeks ago and that spotting is unusual for her. She reports associated suprapubic pain when urinating. Denies any nausea or vomiting. No fever or chills. No flank pain. She states that last time she had any spotting she found out she was pregnant.  Past Medical History:  Diagnosis Date  . Acute appendicitis 07/18/2012  . GDM (gestational diabetes mellitus)   . Hypertension   . Obesity   . Obesity, morbid, BMI 50 or higher (HCC) 07/18/2012  . Prediabetes    "prediabetic; controlled w/diet" (07/17/2012)  . Severe preeclampsia 12/10/2014    Patient Active Problem List   Diagnosis Date Noted  . Benign essential HTN 01/28/2015  . Hypertension in pregnancy 12/15/2014  . History of syphilis 08/11/2014  . Maternal varicella, non-immune 07/21/2014  . 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 S/P cesarean section 07/14/2014  . Obesity, morbid, BMI 82 (HCC) 07/18/2012  . DM type 2 (diabetes mellitus, type 2) (HCC) 10/12/2010    Past Surgical History:  Procedure Laterality Date  . APPENDECTOMY  07/17/2012  . CESAREAN SECTION N/A 12/18/2014   Procedure: CESAREAN SECTION;  Surgeon: Tereso Newcomer, MD;  Location: [facility];  Service: Obstetrics;  Laterality: N/A;  . LAPAROSCOPIC APPENDECTOMY N/A 07/17/2012    Procedure: APPENDECTOMY LAPAROSCOPIC;  Surgeon: Liz Malady, MD;  Location: [facility];  Service: General;  Laterality: N/A;    OB History    Gravida Para Term Preterm AB Living   1 1 1     1    SAB TAB Ectopic Multiple Live Births         0 1       Home Medications    Prior to Admission medications   Medication Sig Start Date End Date Taking? Authorizing Provider  acetaminophen (TYLENOL) 500 MG tablet Take 1,000 mg by mouth every 6 (six) hours as needed for mild pain, moderate pain or headache.     [provider]  Butalbital-APAP-Caffeine 50-325-40 MG per capsule Take 1-2 capsules by mouth every 6 (six) hours as needed for headache. Patient not taking: Reported on 05/13/2015 10/13/14   Federico Flake, MD  enalapril (VASOTEC) 10 MG tablet Take 1 tablet (10 mg total) by mouth daily. Patient not taking: Reported on 08/23/2016 05/13/15   Gilda Crease, MD  norgestimate-ethinyl estradiol (ORTHO-CYCLEN,SPRINTEC,PREVIFEM) 0.25-35 MG-MCG tablet Take 1 tablet by mouth daily. Patient not taking: Reported on 05/13/2015 01/28/15   Federico Flake, MD  Prenatal Vit-Fe Fumarate-FA (PRENATAL COMPLETE) 14-0.4 MG TABS Take 1 tablet by mouth daily. Patient not taking: Reported on 05/13/2015 07/28/14   Levie Heritage, DO    Family History Family History  Problem Relation Age of Onset  . Hypertension Mother   . Diabetes Mother   . Hypertension Maternal Grandmother  Social History Social History  Substance Use Topics  . Smoking status: Former Smoker    Packs/day: 0.25    Years: 1.20    Types: Cigarettes    Quit date: 12/10/2014  . Smokeless tobacco: Former Neurosurgeon    Quit date: 12/10/2014  . Alcohol use No     Comment: last time couple wks ago but not since finding out was pregnant     Allergies   Patient has no known allergies.   Review of Systems Review of Systems  Constitutional: Negative for chills and fever.  Respiratory: Negative for cough, chest  tightness and shortness of breath.   Cardiovascular: Negative for chest pain, palpitations and leg swelling.  Gastrointestinal: Negative for abdominal pain, diarrhea, nausea and vomiting.  Genitourinary: Positive for dysuria, pelvic pain and vaginal bleeding. Negative for flank pain, vaginal discharge and vaginal pain.  Musculoskeletal: Negative for arthralgias, myalgias, neck pain and neck stiffness.  Skin: Negative for rash.  Neurological: Negative for dizziness, weakness and headaches.  All other systems reviewed and are negative.    Physical Exam Updated Vital Signs BP (!) 211/107   Pulse 81   Temp 98.4 F (36.9 C)   Resp 20   Ht 4\' 9"  (1.448 m)   Wt (!) 149.7 kg (330 lb)   SpO2 100%   BMI 71.41 kg/m   Physical Exam  Constitutional: She is oriented to person, place, and time. She appears well-developed and well-nourished. No distress.  HENT:  Head: Normocephalic.  Eyes: Conjunctivae are normal.  Neck: Neck supple.  Cardiovascular: Normal rate, regular rhythm and normal heart sounds.   Pulmonary/Chest: Effort normal and breath sounds normal. No respiratory distress. She has no wheezes. She has no rales.  Abdominal: Soft. Bowel sounds are normal. She exhibits no distension. There is no tenderness. There is no rebound.  Genitourinary:  Genitourinary Comments: Normal external genetalia, blood in vaginal canal. Cervix normal, closed. Mild uterine tenderness. No cmt or adnexal tenderness.   Musculoskeletal: She exhibits no edema.  Neurological: She is alert and oriented to person, place, and time.  Skin: Skin is warm and dry.  Psychiatric: She has a normal mood and affect. Her behavior is normal.  Nursing note and vitals reviewed.    ED Treatments / Results  Labs (all labs ordered are listed, but only abnormal results are displayed) Labs Reviewed  WET PREP, GENITAL - Abnormal; Notable for the following:       Result Value   WBC, Wet Prep HPF POC FEW (*)    All other  components within normal limits  COMPREHENSIVE METABOLIC PANEL - Abnormal; Notable for the following:    Glucose, Bld 106 (*)    All other components within normal limits  CBC - Abnormal; Notable for the following:    WBC 10.6 (*)    RBC 3.83 (*)    Hemoglobin 11.4 (*)    HCT 35.2 (*)    All other components within normal limits  URINALYSIS, ROUTINE W REFLEX MICROSCOPIC - Abnormal; Notable for the following:    Hgb urine dipstick LARGE (*)    Bacteria, UA RARE (*)    Squamous Epithelial / LPF 0-5 (*)    All other components within normal limits  LIPASE, BLOOD  PREGNANCY, URINE  I-STAT BETA HCG BLOOD, ED ([facility], [facility], [facility] ONLY)  GC/CHLAMYDIA PROBE AMP ([place]) NOT AT [facility]    EKG  EKG Interpretation None       Radiology No results found.  Procedures Procedures (including  critical care time)  Medications Ordered in ED Medications - No data to display   Initial Impression / Assessment and Plan / ED Course  I have reviewed the triage vital signs and the nursing notes.  Pertinent labs & imaging results that were available during my care of the patient were reviewed by me and considered in my medical decision making (see chart for details).    Patient in the emergency department with mild uterine cramping and vaginal bleeding. She states this is not normal time for her menstrual cycle. Her exam is unremarkable other than mild bleeding on exam. Minimal tenderness. No adnexal tenderness. Pregnancy test is negative. Urine with no signs of infection. Wet prep unremarkable. Blood work showing anemia, hemoglobin 11.4, baseline otherwise normal. Patient's blood pressure very high, states that he has history of preeclampsia, otherwise no history of hypertension. Suspect dysfunctional uterine bleeding. Will start on NSAIDs. Follow-up with GYN. Blood pressure rechecked, better. We'll have her follow-up with family doctor. Return precautions discussed.  Vitals:   08/23/16 1530 08/23/16  1533 08/23/16 1930 08/23/16 2108  BP: (!) 211/107  136/75 134/73  Pulse: 81  71 76  Resp: 20  20 18   Temp: 98.4 F (36.9 C)  98.3 F (36.8 C) 98.4 F (36.9 C)  TempSrc:   Oral Oral  SpO2: 100%  100% 98%  Weight:  (!) 149.7 kg (330 lb)    Height:  4\' 9"  (1.448 m)       Final Clinical Impressions(s) / ED Diagnoses   Final diagnoses:  Dysfunctional uterine bleeding    New Prescriptions Discharge Medication List as of 08/23/2016  8:55 PM    START taking these medications   Details  naproxen (NAPROSYN) 500 MG tablet Take 1 tablet (500 mg total) by mouth 2 (two) times daily., Starting Tue 08/23/2016, Print         Dealva Lafoy, [place], PA-C 08/24/16 [id number]    Doug SouJacubowitz, Sam, MD 08/24/16 [phone]

## 2016-08-23 NOTE — ED Triage Notes (Signed)
Patient reports onset of last night vaginal spotting. Pt states onset of today LUQ abdominal pain.Pt states last time she spotted she was pregnant.   Pt BP extremely high. Pt states she is aware but has not been able to follow up with PCP due to insurance issues.

## 2016-08-24 LAB — GC/CHLAMYDIA PROBE AMP (~~LOC~~) NOT AT ARMC
Chlamydia: NEGATIVE
Neisseria Gonorrhea: NEGATIVE

## 2016-10-01 ENCOUNTER — Encounter (HOSPITAL_COMMUNITY): Payer: Self-pay | Admitting: *Deleted

## 2016-10-01 ENCOUNTER — Ambulatory Visit (HOSPITAL_COMMUNITY)
Admission: EM | Admit: 2016-10-01 | Discharge: 2016-10-01 | Disposition: A | Discharge status: Home / Self Care | Payer: Medicaid Other | Attending: Family Medicine | Admitting: Family Medicine

## 2016-10-01 DIAGNOSIS — Z20818 Contact with and (suspected) exposure to other bacterial communicable diseases: Secondary | ICD-10-CM

## 2016-10-01 DIAGNOSIS — R059 Cough, unspecified: Secondary | ICD-10-CM

## 2016-10-01 DIAGNOSIS — R05 Cough: Secondary | ICD-10-CM

## 2016-10-01 HISTORY — DX: Unspecified pre-eclampsia, unspecified trimester: O14.90

## 2016-10-01 MED ORDER — HYDROCODONE-HOMATROPINE 5-1.5 MG/5ML PO SYRP: 5.0000 mL | ORAL_SOLUTION | Freq: Four times a day (QID) | ORAL | 0 refills | Status: DC | PRN

## 2016-10-01 MED ORDER — AZITHROMYCIN 250 MG PO TABS: 250.0000 mg | ORAL_TABLET | Freq: Every day | ORAL | 0 refills | Status: DC

## 2016-10-01 NOTE — ED Triage Notes (Signed)
Describes severe coughing fits with extreme fatigue following.

## 2016-10-01 NOTE — ED Triage Notes (Signed)
C/O cough x 5 days.  Was exposed to friend recently diagnosed with pertussis.  Denies fevers.  Mask placed on pt.

## 2016-10-01 NOTE — ED Provider Notes (Signed)
  [facility]   [id number] 10/01/16 Arrival Time: 1300  ASSESSMENT & PLAN:  1. Cough   2. Pertussis exposure    Meds ordered this encounter  Medications  . azithromycin (ZITHROMAX) 250 MG tablet    Sig: Take 1 tablet (250 mg total) by mouth daily. Take first 2 tablets together, then 1 every day until finished.    Dispense:  6 tablet    Refill:  0  . HYDROcodone-homatropine (HYCODAN) 5-1.5 MG/5ML syrup    Sig: Take 5 mLs by mouth every 6 (six) hours as needed for cough.    Dispense:  90 mL    Refill:  0   Will start antibiotic this afternoon. Cough med sedation precautions.  Reviewed expectations re: course of current medical issues. Questions answered. Outlined signs and symptoms indicating need for more acute intervention. Patient verbalized understanding. After Visit Summary given.   SUBJECTIVE:  Kayla Flynn is a 26 y.o. female who presents with complaint of cough for approx 5 days. Dry and non-productive. Affecting sleep. No SOB or wheezing reported. Reports exposure to Pertussis within the past two weeks. Afebrile. OTC cough medications without relief.  ROS: As per HPI.   OBJECTIVE:  Vitals:   10/01/16 1313  BP: (!) 147/105  Pulse: 91  Resp: 18  Temp: 98.3 F (36.8 C)  TempSrc: Oral  SpO2: 100%     General appearance: alert; no distress HEENT: normal Lungs: clear to auscultation bilaterally; dry cough Extremities: no cyanosis or edema; symmetrical with no gross deformities Skin: warm and dry Psychological:  alert and cooperative; normal mood and affect  No Known Allergies  PMHx, SurgHx, SocialHx, Medications, and Allergies were reviewed in the Visit Navigator and updated as appropriate.      Mardella LaymanHagler, Doree Kuehne, MD 10/01/16 [phone]

## 2017-03-27 ENCOUNTER — Ambulatory Visit (HOSPITAL_COMMUNITY)
Admission: EM | Admit: 2017-03-27 | Discharge: 2017-03-27 | Disposition: A | Discharge status: Home / Self Care | Payer: 59 | Attending: Family Medicine | Admitting: Family Medicine

## 2017-03-27 ENCOUNTER — Encounter (HOSPITAL_COMMUNITY): Payer: Self-pay | Admitting: Emergency Medicine

## 2017-03-27 ENCOUNTER — Other Ambulatory Visit: Payer: Self-pay

## 2017-03-27 DIAGNOSIS — N939 Abnormal uterine and vaginal bleeding, unspecified: Secondary | ICD-10-CM | POA: Diagnosis present

## 2017-03-27 LAB — POCT URINALYSIS DIP (DEVICE)
Bilirubin Urine: NEGATIVE
GLUCOSE, UA: NEGATIVE mg/dL
Ketones, ur: NEGATIVE mg/dL
LEUKOCYTES UA: NEGATIVE
Nitrite: NEGATIVE
PROTEIN: NEGATIVE mg/dL
Specific Gravity, Urine: 1.025 (ref 1.005–1.030)
Urobilinogen, UA: 0.2 mg/dL (ref 0.0–1.0)
pH: 7 (ref 5.0–8.0)

## 2017-03-27 LAB — HCG, QUANTITATIVE, PREGNANCY

## 2017-03-27 LAB — POCT PREGNANCY, URINE: PREG TEST UR: NEGATIVE

## 2017-03-27 NOTE — Discharge Instructions (Signed)
Urine pregnancy test negative. We will contact you if the blood measurement reads as if this was a positive pregnancy and bleeding related to miscarriage.   Please continue to monitor symptoms for resolution of flow and abdominal pain. May use Tylenol in the meantime.   If symptoms persisting beyond 2-3 days please go to emergency room to be evaluated with ultrasound.   Follow [facility] with [facility] is symptoms persisting or if menstrual cycles continue to be irregular.

## 2017-03-27 NOTE — ED Triage Notes (Signed)
Pt states she had her period Jan 5th, and then two nights ago she was drinking and in the shower she had some vaginal bleeding and passed a dark red clot. Ever since then pt c/o abdominal cramping and pain.

## 2017-03-28 LAB — CERVICOVAGINAL ANCILLARY ONLY
BACTERIAL VAGINITIS: POSITIVE — AB
CANDIDA VAGINITIS: POSITIVE — AB
Chlamydia: NEGATIVE
NEISSERIA GONORRHEA: NEGATIVE
Trichomonas: NEGATIVE

## 2017-03-28 NOTE — ED Provider Notes (Signed)
[facility]    CSN: [id number] Arrival date & time: 03/27/17  1443     History   Chief Complaint Chief Complaint  Patient presents with  . Vaginal Bleeding    HPI Kayla Flynn is a 27 y.o. female presenting with abnormal uterine bleeding. She reports that her periods are irregular, she is not on birth control. LMP 03/11/17 lasted 5 days, prior to this she did not have menstrual cycle for 5 months. In the past 2 days she has had strong crampy pains in her lower abdomen and low back and passing clots. Bleeding has persisted since. States this has felt different than her normal period cramping. Denies fever, nausea and vomiting. Denies discharge prior to onset of bleeding. Is sexually active with one partner. Denies urinary symptoms.  HPI  Past Medical History:  Diagnosis Date  . Acute appendicitis 07/18/2012  . GDM (gestational diabetes mellitus)   . Hypertension   . Obesity   . Obesity, morbid, BMI 50 or higher (HCC) 07/18/2012  . Pre-eclampsia   . Prediabetes    "prediabetic; controlled w/diet" (07/17/2012)  . Severe preeclampsia 12/10/2014    Patient Active Problem List   Diagnosis Date Noted  . Benign essential HTN 01/28/2015  . Hypertension in pregnancy 12/15/2014  . History of syphilis 08/11/2014  . Maternal varicella, non-immune 07/21/2014  . 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 S/P cesarean section 07/14/2014  . Obesity, morbid, BMI 82 (HCC) 07/18/2012  . DM type 2 (diabetes mellitus, type 2) (HCC) 10/12/2010    Past Surgical History:  Procedure Laterality Date  . APPENDECTOMY  07/17/2012  . CESAREAN SECTION N/A 12/18/2014   Procedure: CESAREAN SECTION;  Surgeon: Tereso NewcomerUgonna A Anyanwu, MD;  Location: [facility];  Service: Obstetrics;  Laterality: N/A;  . LAPAROSCOPIC APPENDECTOMY N/A 07/17/2012   Procedure: APPENDECTOMY LAPAROSCOPIC;  Surgeon: Liz MaladyBurke E Thompson, MD;  Location: [facility];  Service: General;  Laterality: N/A;     OB History    Gravida Para Term Preterm AB Living   1 1 1     1    SAB TAB Ectopic Multiple Live Births         0 1       Home Medications    Prior to Admission medications   Medication Sig Start Date End Date Taking? Authorizing Provider  acetaminophen (TYLENOL) 500 MG tablet Take 1,000 mg by mouth every 6 (six) hours as needed for mild pain, moderate pain or headache.     [provider]  azithromycin (ZITHROMAX) 250 MG tablet Take 1 tablet (250 mg total) by mouth daily. Take first 2 tablets together, then 1 every day until finished. Patient not taking: Reported on 03/27/2017 10/01/16   Mardella LaymanHagler, Brian, MD  Butalbital-APAP-Caffeine [phone] MG per capsule Take 1-2 capsules by mouth every 6 (six) hours as needed for headache. Patient not taking: Reported on 05/13/2015 10/13/14   Federico FlakeNewton, Kimberly Niles, MD  enalapril (VASOTEC) 10 MG tablet Take 1 tablet (10 mg total) by mouth daily. Patient not taking: Reported on 08/23/2016 05/13/15   Gilda CreasePollina, Christopher J, MD  HYDROcodone-homatropine [facility](HYCODAN) 5-1.5 MG/5ML syrup Take 5 mLs by mouth every 6 (six) hours as needed for cough. Patient not taking: Reported on 03/27/2017 10/01/16   Mardella LaymanHagler, Brian, MD  naproxen (NAPROSYN) 500 MG tablet Take 1 tablet (500 mg total) by mouth 2 (two) times daily. Patient not taking: Reported on 03/27/2017 08/23/16   Jaynie CrumbleKirichenko, Tatyana, PA-C  norgestimate-ethinyl estradiol (ORTHO-CYCLEN,SPRINTEC,PREVIFEM) 0.25-35 MG-MCG  tablet Take 1 tablet by mouth daily. Patient not taking: Reported on 05/13/2015 01/28/15   Federico Flake, MD  Prenatal Vit-Fe Fumarate-FA (PRENATAL COMPLETE) 14-0.4 MG TABS Take 1 tablet by mouth daily. Patient not taking: Reported on 05/13/2015 07/28/14   Levie Heritage, DO    Family History Family History  Problem Relation Age of Onset  . Hypertension Mother   . Diabetes Mother   . Hypertension Maternal Grandmother     Social History Social History   Tobacco Use  . Smoking  status: Current Every Day Smoker    Packs/day: 0.25    Years: 1.20    Pack years: 0.30    Types: Cigarettes    Last attempt to quit: 12/10/2014    Years since quitting: 2.2  . Smokeless tobacco: Former Neurosurgeon    Quit date: 12/10/2014  Substance Use Topics  . Alcohol use: No    Comment: last time couple wks ago but not since finding out was pregnant  . Drug use: No     Allergies   Patient has no known allergies.   Review of Systems Review of Systems  Constitutional: Negative for fever.  Respiratory: Negative for shortness of breath.   Cardiovascular: Negative for chest pain.  Gastrointestinal: Positive for abdominal pain. Negative for diarrhea, nausea and vomiting.  Genitourinary: Positive for menstrual problem and vaginal bleeding. Negative for dysuria, flank pain, genital sores, hematuria, vaginal discharge and vaginal pain.  Musculoskeletal: Positive for back pain.  Skin: Negative for rash.  Neurological: Negative for dizziness, light-headedness and headaches.     Physical Exam Triage Vital Signs ED Triage Vitals [03/27/17 1545]  Enc Vitals Group     BP (!) 156/95     Pulse Rate 96     Resp 16     Temp 98.5 F (36.9 C)     Temp Source Oral     SpO2 100 %     Weight      Height      Head Circumference      Peak Flow      Pain Score 8     Pain Loc      Pain Edu?      Excl. in GC?    No data found.  Updated Vital Signs BP (!) 156/95   Pulse 96   Temp 98.5 F (36.9 C) (Oral)   Resp 16   LMP 03/11/2017   SpO2 100%   Visual Acuity Right Eye Distance:   Left Eye Distance:   Bilateral Distance:    Right Eye Near:   Left Eye Near:    Bilateral Near:     Physical Exam  Constitutional: She appears well-developed and well-nourished. No distress.  HENT:  Head: Normocephalic and atraumatic.  Eyes: Conjunctivae are normal.  Neck: Neck supple.  Cardiovascular: Normal rate and regular rhythm.  No murmur heard. Pulmonary/Chest: Effort normal and breath  sounds normal. No respiratory distress.  Abdominal: Soft. She exhibits no distension. There is no tenderness.  nontender to palpation  Genitourinary:  Genitourinary Comments: No lesions or rashes or source of bleeding visualized on external exam. On speculum exam vaginal vault with bright red blood, no CMT, adnexal tenderness or tenderness with palpation of uterus. Exam limited due to habitus of patient  Musculoskeletal: She exhibits no edema.  Neurological: She is alert.  Skin: Skin is warm and dry.  Psychiatric: She has a normal mood and affect.  Nursing note and vitals reviewed.    UC Treatments /  Results  Labs (all labs ordered are listed, but only abnormal results are displayed) Labs Reviewed  POCT URINALYSIS DIP (DEVICE) - Abnormal; Notable for the following components:      Result Value   Hgb urine dipstick LARGE (*)    All other components within normal limits  HCG, QUANTITATIVE, PREGNANCY  POCT PREGNANCY, URINE  CERVICOVAGINAL ANCILLARY ONLY    EKG  EKG Interpretation None       Radiology No results found.  Procedures Procedures (including critical care time)  Medications Ordered in UC Medications - No data to display   Initial Impression / Assessment and Plan / UC Course  I have reviewed the triage vital signs and the nursing notes.  Pertinent labs & imaging results that were available during my care of the patient were reviewed by me and considered in my medical decision making (see chart for details).     UA with blood. Will obtain a quantitative hcg to check for possible miscarriage. Bleeding may simply be an abnormal menstrual cycle. Abdominal pain not reproduced on exam. Patient seems stable. Advised to see if abdominal pain persists over next 1-2 days or to see if it decreases with flow, if persisting she should go to emergency room for evaluation with [place]. Advised to follow up with women's clinic for irregular periods.  Final Clinical Impressions(s)  / UC Diagnoses   Final diagnoses:  Vaginal bleeding    ED Discharge Orders    None       Controlled Substance Prescriptions [place] Controlled Substance Registry consulted? Not Applicable   Lew Dawes, [place] 03/28/17 [phone]

## 2017-03-29 ENCOUNTER — Telehealth: Payer: Self-pay | Admitting: Internal Medicine

## 2017-03-29 MED ORDER — FLUCONAZOLE 150 MG PO TABS: 150.0000 mg | ORAL_TABLET | Freq: Once | ORAL | 0 refills | Status: AC

## 2017-03-29 NOTE — Telephone Encounter (Signed)
Clinical staff, please let patient know that test for candida (yeast) was positive.  Rx fluconazole sent to the pharmacy of record, [facility] on [address].   Test for gardnerella (bacterial vaginosis) was also positive.  This only needs to be treated if there are persistent symptoms, such as vaginal irritation/discharge.   If these symptoms are present, ok to send rx for metronidazole 500mg  bid x 7d #14 no refills or metronidazole vaginal gel 0.75% 1 applicatorful bid x 7d #14 no refills.  Recheck or [facility], [phone], for further evaluation if symptoms are not improving.  LM

## 2017-10-06 ENCOUNTER — Other Ambulatory Visit: Payer: Self-pay

## 2017-10-06 ENCOUNTER — Ambulatory Visit (HOSPITAL_COMMUNITY)
Admission: EM | Admit: 2017-10-06 | Discharge: 2017-10-06 | Disposition: A | Discharge status: Home / Self Care | Payer: Medicaid Other | Attending: Internal Medicine | Admitting: Internal Medicine

## 2017-10-06 ENCOUNTER — Encounter (HOSPITAL_COMMUNITY): Payer: Self-pay

## 2017-10-06 DIAGNOSIS — Z833 Family history of diabetes mellitus: Secondary | ICD-10-CM | POA: Insufficient documentation

## 2017-10-06 DIAGNOSIS — R1030 Lower abdominal pain, unspecified: Secondary | ICD-10-CM | POA: Insufficient documentation

## 2017-10-06 DIAGNOSIS — Z3202 Encounter for pregnancy test, result negative: Secondary | ICD-10-CM | POA: Diagnosis not present

## 2017-10-06 DIAGNOSIS — Z8632 Personal history of gestational diabetes: Secondary | ICD-10-CM | POA: Diagnosis not present

## 2017-10-06 DIAGNOSIS — F17210 Nicotine dependence, cigarettes, uncomplicated: Secondary | ICD-10-CM | POA: Insufficient documentation

## 2017-10-06 DIAGNOSIS — N898 Other specified noninflammatory disorders of vagina: Secondary | ICD-10-CM | POA: Insufficient documentation

## 2017-10-06 DIAGNOSIS — I10 Essential (primary) hypertension: Secondary | ICD-10-CM | POA: Diagnosis not present

## 2017-10-06 DIAGNOSIS — Z8249 Family history of ischemic heart disease and other diseases of the circulatory system: Secondary | ICD-10-CM | POA: Insufficient documentation

## 2017-10-06 DIAGNOSIS — Z113 Encounter for screening for infections with a predominantly sexual mode of transmission: Secondary | ICD-10-CM | POA: Diagnosis not present

## 2017-10-06 DIAGNOSIS — R7303 Prediabetes: Secondary | ICD-10-CM | POA: Diagnosis not present

## 2017-10-06 DIAGNOSIS — Z202 Contact with and (suspected) exposure to infections with a predominantly sexual mode of transmission: Secondary | ICD-10-CM | POA: Insufficient documentation

## 2017-10-06 DIAGNOSIS — R109 Unspecified abdominal pain: Secondary | ICD-10-CM

## 2017-10-06 LAB — POCT URINALYSIS DIP (DEVICE)
BILIRUBIN URINE: NEGATIVE
Glucose, UA: NEGATIVE mg/dL
HGB URINE DIPSTICK: NEGATIVE
KETONES UR: NEGATIVE mg/dL
Leukocytes, UA: NEGATIVE
Nitrite: NEGATIVE
Protein, ur: 100 mg/dL — AB
Urobilinogen, UA: 0.2 mg/dL (ref 0.0–1.0)
pH: 6 (ref 5.0–8.0)

## 2017-10-06 MED ORDER — METRONIDAZOLE 500 MG PO TABS: 500.0000 mg | ORAL_TABLET | Freq: Two times a day (BID) | ORAL | 0 refills | Status: DC

## 2017-10-06 MED ORDER — AZITHROMYCIN 250 MG PO TABS
1000.0000 mg | ORAL_TABLET | Freq: Once | ORAL | Status: AC
Start: 2017-10-06 — End: 2017-10-06
  Administered 2017-10-06: 1000 mg via ORAL

## 2017-10-06 MED ORDER — AZITHROMYCIN 250 MG PO TABS
ORAL_TABLET | ORAL | Status: AC
  Filled 2017-10-06: qty 4

## 2017-10-06 MED ORDER — CEFTRIAXONE SODIUM 250 MG IJ SOLR
INTRAMUSCULAR | Status: AC
  Filled 2017-10-06: qty 250

## 2017-10-06 MED ORDER — CEFTRIAXONE SODIUM 250 MG IJ SOLR
250.0000 mg | Freq: Once | INTRAMUSCULAR | Status: AC
  Administered 2017-10-06: 250 mg via INTRAMUSCULAR

## 2017-10-06 MED ORDER — LIDOCAINE HCL (PF) 1 % IJ SOLN
INTRAMUSCULAR | Status: AC
  Filled 2017-10-06: qty 2

## 2017-10-06 NOTE — Discharge Instructions (Addendum)
UPT was negative Urine did not show signs of infection, but there was protein in your urine.  Please [facility] PCP [facility]2 weeks to have a repeat urine.   Given rocephin 250mg  injection and azithromycin 1g in office Vaginal swab obtained HIV/ syphilis testing today Prescribed metronidazole 500 mg twice daily for 7 days (do not take while consuming alcohol) Take medication as prescribed and to completion We will follow up with you regarding the results of your test If tests are positive, please abstain from sexual activity for at least 7 days and notify partners Follow up with PCP if symptoms persists Return here or go to ER if you have any new or worsening symptoms

## 2017-10-06 NOTE — ED Provider Notes (Signed)
[facility]   [id number] 10/06/17 Arrival Time: 1412  SUBJECTIVE:  Kayla Flynn is a 27 y.o. female who presents with complaint of abdominal discomfort that began gradually 1 week ago.  Patient had unprotected sex 1 week ago. Partner stated he gave her an STD.  Sexually active with 2 female partners.  Localizes pain to lower abdomen.  Describes as worsening, intermittent and achy in character.  Has not tried OTC medications.  Symptoms made worse with intercourse.  Reports similar symptoms in the past and diagnosed with syphilis.  Last BM this morning and looser than normal.  Complains of chills, loss of appetite, nausea, looser stools, increased urinary frequency, vaginal odor  Denies fever, chills, appetite changes, weight changes, nausea, vomiting, chest pain, SOB, diarrhea, constipation, hematochezia, melena, dysuria, difficulty urinating, increased frequency, flank pain, loss of bowel or bladder function, vaginal discharge, vaginal rashes or lesions.    Patient's last menstrual period was 08/10/2017.  ROS: As per HPI.  Past Medical History:  Diagnosis Date  . Acute appendicitis 07/18/2012  . GDM (gestational diabetes mellitus)   . Hypertension   . Obesity   . Obesity, morbid, BMI 50 or higher (HCC) 07/18/2012  . Pre-eclampsia   . Prediabetes    "prediabetic; controlled w/diet" (07/17/2012)  . Severe preeclampsia 12/10/2014   Past Surgical History:  Procedure Laterality Date  . APPENDECTOMY  07/17/2012  . CESAREAN SECTION N/A 12/18/2014   Procedure: CESAREAN SECTION;  Surgeon: Tereso Newcomer, MD;  Location: [facility];  Service: Obstetrics;  Laterality: N/A;  . LAPAROSCOPIC APPENDECTOMY N/A 07/17/2012   Procedure: APPENDECTOMY LAPAROSCOPIC;  Surgeon: Liz Malady, MD;  Location: [facility];  Service: General;  Laterality: N/A;   No Known Allergies No current facility-administered medications on file prior to encounter.    Current Outpatient Medications on File Prior to  Encounter  Medication Sig Dispense Refill  . acetaminophen (TYLENOL) 500 MG tablet Take 1,000 mg by mouth every 6 (six) hours as needed for mild pain, moderate pain or headache.      Social History   Socioeconomic History  . Marital status: Single    Spouse name: Not on file  . Number of children: Not on file  . Years of education: Not on file  . Highest education level: Not on file  Occupational History  . Not on file  Social Needs  . Financial resource strain: Not on file  . Food insecurity:    Worry: Not on file    Inability: Not on file  . Transportation needs:    Medical: Not on file    Non-medical: Not on file  Tobacco Use  . Smoking status: Current Every Day Smoker    Packs/day: 0.25    Years: 1.20    Pack years: 0.30    Types: Cigarettes    Last attempt to quit: 12/10/2014    Years since quitting: 2.8  . Smokeless tobacco: Former Neurosurgeon    Quit date: 12/10/2014  Substance and Sexual Activity  . Alcohol use: No    Comment: last time couple wks ago but not since finding out was pregnant  . Drug use: No  . Sexual activity: Not on file  Lifestyle  . Physical activity:    Days per week: Not on file    Minutes per session: Not on file  . Stress: Not on file  Relationships  . Social connections:    Talks on phone: Not on file    Gets together: Not on file  Attends religious service: Not on file    Active member of club or organization: Not on file    Attends meetings of clubs or organizations: Not on file    Relationship status: Not on file  . Intimate partner violence:    Fear of current or ex partner: Not on file    Emotionally abused: Not on file    Physically abused: Not on file    Forced sexual activity: Not on file  Other Topics Concern  . Not on file  Social History Narrative  . Not on file   Family History  Problem Relation Age of Onset  . Hypertension Mother   . Diabetes Mother   . Hypertension Maternal Grandmother      OBJECTIVE:  Vitals:    10/06/17 1436 10/06/17 1437  BP: (!) 178/96   Pulse: 88   Resp: 19   Temp: (!) 97.4 F (36.3 C)   SpO2: 99%   Weight:  (!) 350 lb (158.8 kg)    General appearance: AOx3 in no acute distress; nontoxic appearance; morbidly obese HEENT: NCAT.  Oropharynx clear.  Lungs: clear to auscultation bilaterally without adventitious breath sounds Heart: regular rate and rhythm.  Radial pulses 2+ symmetrical bilaterally Abdomen: soft, non-distended; protuberant; normal active bowel sounds; mildly tender about the lower abdomen; nontender at McBurney's point; negative rebound; no guarding GU: On external examination no obvious lesions, discharge, or masses Bimanual exam performed prior to speculum exam.  Negative cervical motion, mild left-sided adenexal tenderness Speculum exam: Thin yellow discharge appreciated during pelvic exam.  Unable to visualize cervix Back: no CVA tenderness Extremities: no edema; symmetrical with no gross deformities Skin: warm and dry Neurologic: normal gait Psychological: alert and cooperative; normal mood and affect  Labs: Results for orders placed or performed during the hospital encounter of 10/06/17 (from the past 24 hour(s))  POCT urinalysis dip (device)     Status: Abnormal   Collection Time: 10/06/17  3:27 PM  Result Value Ref Range   Glucose, UA NEGATIVE NEGATIVE mg/dL   Bilirubin Urine NEGATIVE NEGATIVE   Ketones, ur NEGATIVE NEGATIVE mg/dL   Specific Gravity, Urine >=1.030 1.005 - 1.030   Hgb urine dipstick NEGATIVE NEGATIVE   pH 6.0 5.0 - 8.0   Protein, ur 100 (A) NEGATIVE mg/dL   Urobilinogen, UA 0.2 0.0 - 1.0 mg/dL   Nitrite NEGATIVE NEGATIVE   Leukocytes, UA NEGATIVE NEGATIVE    ASSESSMENT & PLAN:  1. Vaginal discharge   2. Vaginal odor   3. Abdominal discomfort   4. STD exposure     Meds ordered this encounter  Medications  . azithromycin (ZITHROMAX) tablet 1,000 mg  . cefTRIAXone (ROCEPHIN) injection 250 mg  . metroNIDAZOLE (FLAGYL)  500 MG tablet    Sig: Take 1 tablet (500 mg total) by mouth 2 (two) times daily.    Dispense:  14 tablet    Refill:  0    Order Specific Question:   Supervising Provider    Answer:   Isa RankinMURRAY, LAURA WILSON [[id number]]   UPT was negative Urine did not show signs of infection, but there was protein in your urine.  Please follow up with PCP or [facility] in 1-2 weeks to have a repeat urine.   Given rocephin 250mg  injection and azithromycin 1g in office Vaginal swab obtained HIV/ syphilis testing today Prescribed metronidazole 500 mg twice daily for 7 days (do not take while consuming alcohol) Take medication as prescribed and to completion We will  follow up with you regarding the results of your test If tests are positive, please abstain from sexual activity for at least 7 days and notify partners Follow up with PCP if symptoms persists Return here or go to ER if you have any new or worsening symptoms    Reviewed expectations re: course of current medical issues. Questions answered. Outlined signs and symptoms indicating need for more acute intervention. Patient verbalized understanding. After Visit Summary given.   Rennis Harding, PA-C 10/06/17 1621

## 2017-10-06 NOTE — ED Triage Notes (Signed)
Pt has lower abdominal pain. X 1 week . Pt has an argument with boyfriend she wants to be checked for STI.

## 2017-10-07 LAB — RPR: RPR Ser Ql: NONREACTIVE

## 2017-10-07 LAB — HIV ANTIBODY (ROUTINE TESTING W REFLEX): HIV Screen 4th Generation wRfx: NONREACTIVE

## 2017-10-09 LAB — CERVICOVAGINAL ANCILLARY ONLY
CHLAMYDIA, DNA PROBE: NEGATIVE
Neisseria Gonorrhea: NEGATIVE
TRICH (WINDOWPATH): NEGATIVE

## 2017-10-09 LAB — POCT PREGNANCY, URINE: PREG TEST UR: NEGATIVE

## 2018-01-21 ENCOUNTER — Other Ambulatory Visit: Payer: Self-pay

## 2018-01-21 ENCOUNTER — Ambulatory Visit (HOSPITAL_COMMUNITY)
Admission: EM | Admit: 2018-01-21 | Discharge: 2018-01-21 | Disposition: A | Discharge status: Home / Self Care | Payer: Medicaid Other | Attending: Family Medicine | Admitting: Family Medicine

## 2018-01-21 ENCOUNTER — Encounter (HOSPITAL_COMMUNITY): Payer: Self-pay | Admitting: Emergency Medicine

## 2018-01-21 DIAGNOSIS — J22 Unspecified acute lower respiratory infection: Secondary | ICD-10-CM | POA: Diagnosis not present

## 2018-01-21 DIAGNOSIS — H9202 Otalgia, left ear: Secondary | ICD-10-CM

## 2018-01-21 DIAGNOSIS — I10 Essential (primary) hypertension: Secondary | ICD-10-CM | POA: Diagnosis not present

## 2018-01-21 MED ORDER — IPRATROPIUM-ALBUTEROL 0.5-2.5 (3) MG/3ML IN SOLN
3.0000 mL | Freq: Once | RESPIRATORY_TRACT | Status: AC
  Administered 2018-01-21: 3 mL via RESPIRATORY_TRACT

## 2018-01-21 MED ORDER — IPRATROPIUM-ALBUTEROL 0.5-2.5 (3) MG/3ML IN SOLN
RESPIRATORY_TRACT | Status: AC
  Filled 2018-01-21: qty 3

## 2018-01-21 MED ORDER — GUAIFENESIN ER 600 MG PO TB12: 1200.0000 mg | ORAL_TABLET | Freq: Two times a day (BID) | ORAL | 0 refills | Status: AC | PRN

## 2018-01-21 MED ORDER — AZITHROMYCIN 250 MG PO TABS: ORAL_TABLET | ORAL | 0 refills | Status: AC

## 2018-01-21 MED ORDER — ALBUTEROL SULFATE HFA 108 (90 BASE) MCG/ACT IN AERS: 1.0000 | INHALATION_SPRAY | Freq: Four times a day (QID) | RESPIRATORY_TRACT | 0 refills | Status: DC | PRN

## 2018-01-21 NOTE — ED Triage Notes (Signed)
Cough, congestion for a week.  Now has chest soreness with cough.  Patient has had cold chills last night.  Woke during the night with left ear pain and popping

## 2018-01-21 NOTE — Discharge Instructions (Signed)
Push fluids to ensure adequate hydration and keep secretions thin.  Tylenol and/or ibuprofen as needed for pain or fevers.  Complete course of antibiotics.  Mucinex as an expectorant.  Use of inhaler as needed for wheezing or shortness of breath .  If symptoms worsen or do not improve in the next week to return to be seen or to follow up with your PCP.

## 2018-01-21 NOTE — ED Provider Notes (Signed)
[facility]    CSN: [id number] Arrival date & time: 01/21/18  1138     History   Chief Complaint Chief Complaint  Patient presents with  . URI    HPI Kayla Flynn is a 27 y.o. female.   Kayla Flynn presents with complaints of worsening cough, shortness of breath and left ear pain. Started out with sneezing and congestion a week ago, has improved somewhat until the last two days. Last night had chills, unable to stop coughing. Left ear pain, muffling, and popping. Took tylenol which helped some. No gi/gu complaints. Some congestion still. Son attends daycare and has had some URI symptoms as well. No rash. No known history of asthma. Hx of htn, obesity, prediabetes.     ROS per HPI.      Past Medical History:  Diagnosis Date  . Acute appendicitis 07/18/2012  . GDM (gestational diabetes mellitus)   . Hypertension   . Obesity   . Obesity, morbid, BMI 50 or higher (HCC) 07/18/2012  . Pre-eclampsia   . Prediabetes    "prediabetic; controlled w/diet" (07/17/2012)  . Severe preeclampsia 12/10/2014    Patient Active Problem List   Diagnosis Date Noted  . Benign essential HTN 01/28/2015  . Hypertension in pregnancy 12/15/2014  . History of syphilis 08/11/2014  . Maternal varicella, non-immune 07/21/2014  . 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 S/P cesarean section 07/14/2014  . Obesity, morbid, BMI 82 (HCC) 07/18/2012  . DM type 2 (diabetes mellitus, type 2) (HCC) 10/12/2010    Past Surgical History:  Procedure Laterality Date  . APPENDECTOMY  07/17/2012  . CESAREAN SECTION N/A 12/18/2014   Procedure: CESAREAN SECTION;  Surgeon: Tereso NewcomerUgonna A Anyanwu, MD;  Location: [facility];  Service: Obstetrics;  Laterality: N/A;  . LAPAROSCOPIC APPENDECTOMY N/A 07/17/2012   Procedure: APPENDECTOMY LAPAROSCOPIC;  Surgeon: Liz MaladyBurke E Thompson, MD;  Location: [facility];  Service: General;  Laterality: N/A;    OB History    Gravida  1   Para  1   Term  1   Preterm      AB      Living  1     SAB      TAB      Ectopic      Multiple  0   Live Births  1            Home Medications    Prior to Admission medications   Medication Sig Start Date End Date Taking? Authorizing Provider  acetaminophen (TYLENOL) 500 MG tablet Take 1,000 mg by mouth every 6 (six) hours as needed for mild pain, moderate pain or headache.    Yes [provider]  albuterol (PROVENTIL HFA;VENTOLIN HFA) 108 (90 Base) MCG/ACT inhaler Inhale 1-2 puffs into the lungs every 6 (six) hours as needed for wheezing or shortness of breath. 01/21/18   Georgetta HaberBurky, Natalie B, NP  azithromycin (ZITHROMAX) 250 MG tablet Take 2 tablets (500 mg total) by mouth daily for 1 day, THEN 1 tablet (250 mg total) daily for 4 days. 01/21/18 01/26/18  Georgetta HaberBurky, Natalie B, NP  guaiFENesin (MUCINEX) 600 MG 12 hr tablet Take 2 tablets (1,200 mg total) by mouth 2 (two) times daily as needed for up to 5 days. 01/21/18 01/26/18  Georgetta HaberBurky, Natalie B, NP    Family History Family History  Problem Relation Age of Onset  . Hypertension Mother   . Diabetes Mother   . Hypertension Maternal Grandmother  Social History Social History   Tobacco Use  . Smoking status: Current Every Day Smoker    Packs/day: 0.25    Years: 1.20    Pack years: 0.30    Types: Cigarettes  . Smokeless tobacco: Former Neurosurgeon    Quit date: 12/10/2014  Substance Use Topics  . Alcohol use: No    Comment: last time couple wks ago but not since finding out was pregnant  . Drug use: No     Allergies   Patient has no known allergies.   Review of Systems Review of Systems   Physical Exam Triage Vital Signs ED Triage Vitals  Enc Vitals Group     BP 01/21/18 1210 (!) 144/81     Pulse Rate 01/21/18 1210 98     Resp 01/21/18 1210 (!) 26     Temp 01/21/18 1210 99 F (37.2 C)     Temp Source 01/21/18 1210 Oral     SpO2 01/21/18 1210 94 %     Weight --      Height --      Head Circumference --        Peak Flow --      Pain Score 01/21/18 1208 9     Pain Loc --      Pain Edu? --      Excl. in GC? --    No data found.  Updated Vital Signs BP (!) 144/81 (BP Location: Right Arm) Comment (BP Location): regular cuff, forearm  Pulse 98   Temp 99 F (37.2 C) (Oral)   Resp (!) 26   LMP 01/08/2018   SpO2 100%   Visual Acuity Right Eye Distance:   Left Eye Distance:   Bilateral Distance:    Right Eye Near:   Left Eye Near:    Bilateral Near:     Physical Exam  Constitutional: She is oriented to person, place, and time. She appears well-developed and well-nourished. No distress.  HENT:  Head: Normocephalic and atraumatic.  Right Ear: Tympanic membrane, external ear and ear canal normal.  Left Ear: Tympanic membrane, external ear and ear canal normal.  Nose: Rhinorrhea present.  Mouth/Throat: Uvula is midline, oropharynx is clear and moist and mucous membranes are normal. No tonsillar exudate.  Eyes: Pupils are equal, round, and reactive to light. Conjunctivae and EOM are normal.  Cardiovascular: Normal rate, regular rhythm and normal heart sounds.  Pulmonary/Chest: Effort normal. She has rhonchi.  Scattered rhonchi with only minimal improvement s/p nebulizer  Neurological: She is alert and oriented to person, place, and time.  Skin: Skin is warm and dry.     UC Treatments / Results  Labs (all labs ordered are listed, but only abnormal results are displayed) Labs Reviewed - No data to display  EKG None  Radiology No results found.  Procedures Procedures (including critical care time)  Medications Ordered in UC Medications  ipratropium-albuterol (DUONEB) 0.5-2.5 (3) MG/3ML nebulizer solution 3 mL (3 mLs Nebulization Given 01/21/18 1243)    Initial Impression / Assessment and Plan / UC Course  I have reviewed the triage vital signs and the nursing notes.  Pertinent labs & imaging results that were available during my care of the patient were reviewed by me  and considered in my medical decision making (see chart for details).     Original O2 of 94%, improved to 100% with nebulizer. Scattered rhonchi throughout. Will cover with azithromycin. Use of inhaler prn. Continue with supportive cares. Return precautions provided. If symptoms  worsen or do not improve in the next week to return to be seen or to follow up with PCP.  Patient verbalized understanding and agreeable to plan.   Final Clinical Impressions(s) / UC Diagnoses   Final diagnoses:  Lower respiratory infection     Discharge Instructions     Push fluids to ensure adequate hydration and keep secretions thin.  Tylenol and/or ibuprofen as needed for pain or fevers.  Complete course of antibiotics.  Mucinex as an expectorant.  Use of inhaler as needed for wheezing or shortness of breath .  If symptoms worsen or do not improve in the next week to return to be seen or to follow up with your PCP.      ED Prescriptions    Medication Sig Dispense Auth. Provider   azithromycin (ZITHROMAX) 250 MG tablet Take 2 tablets (500 mg total) by mouth daily for 1 day, THEN 1 tablet (250 mg total) daily for 4 days. 6 tablet Linus Mako B, NP   albuterol (PROVENTIL HFA;VENTOLIN HFA) 108 (90 Base) MCG/ACT inhaler Inhale 1-2 puffs into the lungs every 6 (six) hours as needed for wheezing or shortness of breath. 1 Inhaler Linus Mako B, NP   guaiFENesin (MUCINEX) 600 MG 12 hr tablet Take 2 tablets (1,200 mg total) by mouth 2 (two) times daily as needed for up to 5 days. 20 tablet Georgetta Haber, NP     Controlled Substance Prescriptions [place] Controlled Substance Registry consulted? Not Applicable   Georgetta Haber, NP 01/21/18 1310

## 2018-02-07 ENCOUNTER — Ambulatory Visit (HOSPITAL_COMMUNITY)
Admission: EM | Admit: 2018-02-07 | Discharge: 2018-02-07 | Disposition: A | Discharge status: Home / Self Care | Payer: Medicaid Other | Attending: Internal Medicine | Admitting: Internal Medicine

## 2018-02-07 ENCOUNTER — Encounter (HOSPITAL_COMMUNITY): Payer: Self-pay

## 2018-02-07 DIAGNOSIS — F17210 Nicotine dependence, cigarettes, uncomplicated: Secondary | ICD-10-CM | POA: Diagnosis not present

## 2018-02-07 DIAGNOSIS — Z113 Encounter for screening for infections with a predominantly sexual mode of transmission: Secondary | ICD-10-CM | POA: Diagnosis not present

## 2018-02-07 DIAGNOSIS — I10 Essential (primary) hypertension: Secondary | ICD-10-CM | POA: Diagnosis not present

## 2018-02-07 DIAGNOSIS — Z8249 Family history of ischemic heart disease and other diseases of the circulatory system: Secondary | ICD-10-CM | POA: Insufficient documentation

## 2018-02-07 DIAGNOSIS — E119 Type 2 diabetes mellitus without complications: Secondary | ICD-10-CM | POA: Insufficient documentation

## 2018-02-07 DIAGNOSIS — R319 Hematuria, unspecified: Secondary | ICD-10-CM | POA: Diagnosis not present

## 2018-02-07 DIAGNOSIS — R300 Dysuria: Secondary | ICD-10-CM | POA: Diagnosis present

## 2018-02-07 DIAGNOSIS — N390 Urinary tract infection, site not specified: Secondary | ICD-10-CM | POA: Diagnosis not present

## 2018-02-07 DIAGNOSIS — N898 Other specified noninflammatory disorders of vagina: Secondary | ICD-10-CM | POA: Diagnosis not present

## 2018-02-07 DIAGNOSIS — Z202 Contact with and (suspected) exposure to infections with a predominantly sexual mode of transmission: Secondary | ICD-10-CM | POA: Diagnosis not present

## 2018-02-07 LAB — POCT URINALYSIS DIP (DEVICE)
BILIRUBIN URINE: NEGATIVE
GLUCOSE, UA: NEGATIVE mg/dL
Ketones, ur: NEGATIVE mg/dL
Nitrite: NEGATIVE
Protein, ur: 100 mg/dL — AB
Specific Gravity, Urine: >=1.03 (ref 1.005–1.030)
UROBILINOGEN UA: 1 mg/dL (ref 0.0–1.0)
pH: 6.5 (ref 5.0–8.0)

## 2018-02-07 MED ORDER — CEFTRIAXONE SODIUM 250 MG IJ SOLR
250.0000 mg | Freq: Once | INTRAMUSCULAR | Status: AC
  Administered 2018-02-07: 250 mg via INTRAMUSCULAR

## 2018-02-07 MED ORDER — CEFTRIAXONE SODIUM 250 MG IJ SOLR
INTRAMUSCULAR | Status: AC
  Filled 2018-02-07: qty 250

## 2018-02-07 MED ORDER — AZITHROMYCIN 500 MG PO TABS: ORAL_TABLET | ORAL | 0 refills | Status: DC

## 2018-02-07 MED ORDER — METRONIDAZOLE 500 MG PO TABS: 500.0000 mg | ORAL_TABLET | Freq: Two times a day (BID) | ORAL | 0 refills | Status: DC

## 2018-02-07 MED ORDER — CIPROFLOXACIN HCL 500 MG PO TABS: 500.0000 mg | ORAL_TABLET | Freq: Two times a day (BID) | ORAL | 0 refills | Status: AC

## 2018-02-07 NOTE — Discharge Instructions (Addendum)
Abstain from sex til done with treatment and make sure to Follow up with Jerrilyn CairoKim Harris.  Use condoms with future sex partners every time.

## 2018-02-07 NOTE — ED Provider Notes (Signed)
[facility]    CSN: [id number] Arrival date & time: 02/07/18  1535     History   Chief Complaint Chief Complaint  Patient presents with  . Vaginal Issues    HPI Kayla Flynn is a 27 y.o. female.   One week ago developed frequency, dysuria and urgency off and on.  Has gray malodorous discharge 2 days after onset of dysuria. Since yesterday noticed itching.  She has had unprotected sex with short term sexual female partner. LMP 11/10. Denies use of birth control. Does not use tampons with her periods. Tries to get STD testing q 3 months. Her prior sexual partner of 7 years would brake up with off and on, and she was not sure where he had been, so this is the reason she would get tested this often. In the past 3 days she has been getting pelvic pain. She does not have a PCP.      Past Medical History:  Diagnosis Date  . Acute appendicitis 07/18/2012  . GDM (gestational diabetes mellitus)   . Hypertension   . Obesity   . Obesity, morbid, BMI 50 or higher (HCC) 07/18/2012  . Pre-eclampsia   . Prediabetes    "prediabetic; controlled w/diet" (07/17/2012)  . Severe preeclampsia 12/10/2014    Patient Active Problem List   Diagnosis Date Noted  . Benign essential HTN 01/28/2015  . Hypertension in pregnancy 12/15/2014  . History of syphilis 08/11/2014  . Maternal varicella, non-immune 07/21/2014  . 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 S/P cesarean section 07/14/2014  . Obesity, morbid, BMI 82 (HCC) 07/18/2012  . DM type 2 (diabetes mellitus, type 2) (HCC) 10/12/2010    Past Surgical History:  Procedure Laterality Date  . APPENDECTOMY  07/17/2012  . CESAREAN SECTION N/A 12/18/2014   Procedure: CESAREAN SECTION;  Surgeon: Tereso NewcomerUgonna A Anyanwu, MD;  Location: [facility];  Service: Obstetrics;  Laterality: N/A;  . LAPAROSCOPIC APPENDECTOMY N/A 07/17/2012   Procedure: APPENDECTOMY LAPAROSCOPIC;  Surgeon: Liz MaladyBurke E Thompson, MD;  Location:  [facility];  Service: General;  Laterality: N/A;    OB History    Gravida  1   Para  1   Term  1   Preterm      AB      Living  1     SAB      TAB      Ectopic      Multiple  0   Live Births  1            Home Medications    Prior to Admission medications   Medication Sig Start Date End Date Taking? Authorizing Provider  acetaminophen (TYLENOL) 500 MG tablet Take 1,000 mg by mouth every 6 (six) hours as needed for mild pain, moderate pain or headache.     [provider]  albuterol (PROVENTIL HFA;VENTOLIN HFA) 108 (90 Base) MCG/ACT inhaler Inhale 1-2 puffs into the lungs every 6 (six) hours as needed for wheezing or shortness of breath. 01/21/18   Georgetta HaberBurky, Natalie B, NP  azithromycin (ZITHROMAX) 500 MG tablet Take 2 at ones 02/07/18   Rodriguez-Southworth, Nettie ElmSylvia, PA-C  ciprofloxacin (CIPRO) 500 MG tablet Take 1 tablet (500 mg total) by mouth 2 (two) times daily for 3 days. 02/07/18 02/10/18  Rodriguez-Southworth, Nettie ElmSylvia, PA-C  metroNIDAZOLE (FLAGYL) 500 MG tablet Take 1 tablet (500 mg total) by mouth 2 (two) times daily. 02/07/18   Rodriguez-Southworth, Nettie ElmSylvia, PA-C    Family History Family History  Problem Relation Age of Onset  . Hypertension Mother   . Diabetes Mother   . Hypertension Maternal Grandmother     Social History Social History   Tobacco Use  . Smoking status: Current Every Day Smoker    Packs/day: 0.25    Years: 1.20    Pack years: 0.30    Types: Cigarettes  . Smokeless tobacco: Former Neurosurgeon    Quit date: 12/10/2014  Substance Use Topics  . Alcohol use: No    Comment: last time couple wks ago but not since finding out was pregnant  . Drug use: No     Allergies   Patient has no known allergies.   Review of Systems Review of Systems  Constitutional: Negative for chills, diaphoresis and fever.  Gastrointestinal: Negative for nausea.  Genitourinary: Positive for dysuria, frequency, pelvic pain, urgency and vaginal discharge.  Negative for difficulty urinating, flank pain, genital sores and vaginal bleeding.  Musculoskeletal: Negative for gait problem.  Skin: Negative for rash and wound.  Hematological: Negative for adenopathy.     Physical Exam Triage Vital Signs ED Triage Vitals  Enc Vitals Group     BP 02/07/18 1614 (!) 118/96     Pulse Rate 02/07/18 1614 89     Resp 02/07/18 1614 20     Temp 02/07/18 1614 97.9 F (36.6 C)     Temp Source 02/07/18 1614 Oral     SpO2 02/07/18 1614 100 %     Weight --      Height --      Head Circumference --      Peak Flow --      Pain Score 02/07/18 1621 5     Pain Loc --      Pain Edu? --      Excl. in GC? --    No data found.  Updated Vital Signs BP (!) 118/96 (BP Location: Right Arm)   Pulse 89   Temp 97.9 F (36.6 C) (Oral)   Resp 20   LMP 01/08/2018   SpO2 100%   Visual Acuity Right Eye Distance:   Left Eye Distance:   Bilateral Distance:    Right Eye Near:   Left Eye Near:    Bilateral Near:     Physical Exam  Constitutional: She is oriented to person, place, and time. She appears well-developed and well-nourished.  HENT:  Head: Normocephalic.  Right Ear: External ear normal.  Left Ear: External ear normal.  Nose: Nose normal.  Eyes: Conjunctivae are normal. No scleral icterus.  Neck: Neck supple.  Pulmonary/Chest: Effort normal.  Genitourinary: Vaginal discharge found.  Genitourinary Comments: Has watery gray-light green discharge, but I did not noticed a fishy odor. Cervix is pink. Bimanual exam revealed pain with uterine motion. Difficult to acess masses due to body habitus's.   Musculoskeletal: Normal range of motion.  Neurological: She is alert and oriented to person, place, and time.  Skin: Skin is warm and dry. No rash noted. No erythema.  Psychiatric: She has a normal mood and affect. Her behavior is normal. Judgment and thought content normal.  Nursing note and vitals reviewed.  UC Treatments / Results  Labs (all labs  ordered are listed, but only abnormal results are displayed) Labs Reviewed  POCT URINALYSIS DIP (DEVICE) - Abnormal; Notable for the following components:      Result Value   Hgb urine dipstick MODERATE (*)    Protein, ur 100 (*)    Leukocytes, UA LARGE (*)  All other components within normal limits  URINE CULTURE  HIV ANTIBODY (ROUTINE TESTING W REFLEX)  CERVICOVAGINAL ANCILLARY ONLY    EKG None  Radiology No results found.  Procedures Procedures   Medications Ordered in UC Medications  cefTRIAXone (ROCEPHIN) injection 250 mg (250 mg Intramuscular Given 02/07/18 1753)    Initial Impression / Assessment and Plan / UC Course  I have reviewed the triage vital signs and the nursing notes. I suspect she may have trich, and pelvic infection. She was given Rocephin 250 mg IM, and I sent Flagyl and Azithromycin as noted. I also sent Cipro for 3 days to cover UTI Urine culture was sent out. Medication precautions reviewed with her Needs to get established with a PCP, information given.     Final Clinical Impressions(s) / UC Diagnoses   Final diagnoses:  Vaginal discharge  Routine screening for STI (sexually transmitted infection)  Urinary tract infection with hematuria, site unspecified     Discharge Instructions     Abstain from sex til done with treatment and make sure to Follow up with Jerrilyn Cairo.  Use condoms with future sex partners every time.     ED Prescriptions    Medication Sig Dispense Auth. Provider   metroNIDAZOLE (FLAGYL) 500 MG tablet Take 1 tablet (500 mg total) by mouth 2 (two) times daily. 14 tablet Rodriguez-Southworth, Nettie Elm, PA-C   azithromycin (ZITHROMAX) 500 MG tablet Take 2 at ones 2 tablet Rodriguez-Southworth, Nettie Elm, PA-C   ciprofloxacin (CIPRO) 500 MG tablet Take 1 tablet (500 mg total) by mouth 2 (two) times daily for 3 days. 6 tablet Rodriguez-Southworth, Nettie Elm, PA-C     Controlled Substance Prescriptions [place] Controlled Substance  Registry consulted?    Garey Ham, [place] 02/07/18 2141

## 2018-02-07 NOTE — ED Triage Notes (Signed)
Pt presents with pelvic pain, slight discharge, foul odor, and burning with urination.

## 2018-02-08 LAB — CERVICOVAGINAL ANCILLARY ONLY
BACTERIAL VAGINITIS: NEGATIVE
Candida vaginitis: NEGATIVE
Chlamydia: NEGATIVE
NEISSERIA GONORRHEA: NEGATIVE
Trichomonas: POSITIVE — AB

## 2018-02-08 LAB — HIV ANTIBODY (ROUTINE TESTING W REFLEX): HIV Screen 4th Generation wRfx: NONREACTIVE

## 2018-02-08 LAB — URINE CULTURE: SPECIAL REQUESTS: NORMAL

## 2018-03-04 ENCOUNTER — Ambulatory Visit (HOSPITAL_COMMUNITY)
Admission: EM | Admit: 2018-03-04 | Discharge: 2018-03-04 | Disposition: A | Discharge status: Home / Self Care | Payer: Medicaid Other | Attending: Family Medicine | Admitting: Family Medicine

## 2018-03-04 ENCOUNTER — Encounter (HOSPITAL_COMMUNITY): Payer: Self-pay | Admitting: Emergency Medicine

## 2018-03-04 DIAGNOSIS — J111 Influenza due to unidentified influenza virus with other respiratory manifestations: Secondary | ICD-10-CM

## 2018-03-04 DIAGNOSIS — R69 Illness, unspecified: Secondary | ICD-10-CM | POA: Diagnosis not present

## 2018-03-04 MED ORDER — OSELTAMIVIR PHOSPHATE 75 MG PO CAPS: 75.0000 mg | ORAL_CAPSULE | Freq: Two times a day (BID) | ORAL | 0 refills | Status: DC

## 2018-03-04 MED ORDER — IBUPROFEN 800 MG PO TABS: 800.0000 mg | ORAL_TABLET | Freq: Three times a day (TID) | ORAL | 0 refills | Status: DC

## 2018-03-04 MED ORDER — ACETAMINOPHEN 325 MG PO TABS
ORAL_TABLET | ORAL | Status: AC
  Filled 2018-03-04: qty 2

## 2018-03-04 MED ORDER — ACETAMINOPHEN 325 MG PO TABS
650.0000 mg | ORAL_TABLET | Freq: Once | ORAL | Status: AC
  Administered 2018-03-04: 650 mg via ORAL

## 2018-03-04 NOTE — Discharge Instructions (Signed)
Return if any problems.

## 2018-03-04 NOTE — ED Provider Notes (Signed)
[facility]    CSN: [id number] Arrival date & time: 03/04/18  1036     History   Chief Complaint Chief Complaint  Patient presents with  . Influenza    HPI Kayla Flynn is a 27 y.o. female.   The history is provided by the patient. No language interpreter was used.  Influenza  Presenting symptoms: cough, fatigue, fever, headache, myalgias and nausea   Severity:  Moderate Onset quality:  Gradual Progression:  Worsening Chronicity:  New Relieved by:  Nothing Worsened by:  Nothing Ineffective treatments:  None tried Associated symptoms: no chills     Past Medical History:  Diagnosis Date  . Acute appendicitis 07/18/2012  . GDM (gestational diabetes mellitus)   . Hypertension   . Obesity   . Obesity, morbid, BMI 50 or higher (HCC) 07/18/2012  . Pre-eclampsia   . Prediabetes    "prediabetic; controlled w/diet" (07/17/2012)  . Severe preeclampsia 12/10/2014    Patient Active Problem List   Diagnosis Date Noted  . Benign essential HTN 01/28/2015  . Hypertension in pregnancy 12/15/2014  . History of syphilis 08/11/2014  . Maternal varicella, non-immune 07/21/2014  . 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 S/P cesarean section 07/14/2014  . Obesity, morbid, BMI 82 (HCC) 07/18/2012  . DM type 2 (diabetes mellitus, type 2) (HCC) 10/12/2010    Past Surgical History:  Procedure Laterality Date  . APPENDECTOMY  07/17/2012  . CESAREAN SECTION N/A 12/18/2014   Procedure: CESAREAN SECTION;  Surgeon: Tereso NewcomerUgonna A Anyanwu, MD;  Location: [facility];  Service: Obstetrics;  Laterality: N/A;  . LAPAROSCOPIC APPENDECTOMY N/A 07/17/2012   Procedure: APPENDECTOMY LAPAROSCOPIC;  Surgeon: Liz MaladyBurke E Thompson, MD;  Location: [facility];  Service: General;  Laterality: N/A;    OB History    Gravida  1   Para  1   Term  1   Preterm      AB      Living  1     SAB      TAB      Ectopic      Multiple  0   Live Births  1            Home Medications    Prior to Admission medications   Medication Sig Start Date End Date Taking? Authorizing Provider  acetaminophen (TYLENOL) 500 MG tablet Take 1,000 mg by mouth every 6 (six) hours as needed for mild pain, moderate pain or headache.     [provider]  albuterol (PROVENTIL HFA;VENTOLIN HFA) 108 (90 Base) MCG/ACT inhaler Inhale 1-2 puffs into the lungs every 6 (six) hours as needed for wheezing or shortness of breath. 01/21/18   Georgetta HaberBurky, Natalie B, NP  azithromycin (ZITHROMAX) 500 MG tablet Take 2 at ones Patient not taking: Reported on 03/04/2018 02/07/18   Rodriguez-Southworth, Nettie ElmSylvia, PA-C  ibuprofen (ADVIL,MOTRIN) 800 MG tablet Take 1 tablet (800 mg total) by mouth 3 (three) times daily. 03/04/18   Elson AreasSofia, Leslie K, PA-C  metroNIDAZOLE (FLAGYL) 500 MG tablet Take 1 tablet (500 mg total) by mouth 2 (two) times daily. Patient not taking: Reported on 03/04/2018 02/07/18   Rodriguez-Southworth, Nettie ElmSylvia, PA-C  oseltamivir (TAMIFLU) 75 MG capsule Take 1 capsule (75 mg total) by mouth every 12 (twelve) hours. 03/04/18   Elson AreasSofia, Leslie K, PA-C    Family History Family History  Problem Relation Age of Onset  . Hypertension Mother   . Diabetes Mother   . Hypertension Maternal Grandmother  Social History Social History   Tobacco Use  . Smoking status: Current Every Day Smoker    Packs/day: 0.25    Years: 1.20    Pack years: 0.30    Types: Cigarettes  . Smokeless tobacco: Former Neurosurgeon    Quit date: 12/10/2014  Substance Use Topics  . Alcohol use: No    Comment: last time couple wks ago but not since finding out was pregnant  . Drug use: No     Allergies   Patient has no known allergies.   Review of Systems Review of Systems  Constitutional: Positive for fatigue and fever. Negative for chills.  Respiratory: Positive for cough.   Gastrointestinal: Positive for nausea.  Musculoskeletal: Positive for myalgias.  Neurological: Positive for headaches.   All other systems reviewed and are negative.    Physical Exam Triage Vital Signs ED Triage Vitals [03/04/18 1200]  Enc Vitals Group     BP 139/86     Pulse Rate (!) 113     Resp 18     Temp (!) 102.6 F (39.2 C)     Temp Source Oral     SpO2 100 %     Weight      Height      Head Circumference      Peak Flow      Pain Score 10     Pain Loc      Pain Edu?      Excl. in GC?    No data found.  Updated Vital Signs BP 139/86 (BP Location: Right Arm)   Pulse (!) 113   Temp (!) 102.6 F (39.2 C) (Oral)   Resp 18   SpO2 100%   Visual Acuity Right Eye Distance:   Left Eye Distance:   Bilateral Distance:    Right Eye Near:   Left Eye Near:    Bilateral Near:     Physical Exam Vitals signs and nursing note reviewed.  Constitutional:      Appearance: She is well-developed.  HENT:     Head: Normocephalic.     Right Ear: Tympanic membrane normal.     Left Ear: Tympanic membrane normal.     Nose: Nose normal.     Mouth/Throat:     Mouth: Mucous membranes are moist.  Eyes:     Pupils: Pupils are equal, round, and reactive to light.  Neck:     Musculoskeletal: Normal range of motion.  Cardiovascular:     Rate and Rhythm: Normal rate.     Pulses: Normal pulses.  Pulmonary:     Effort: Pulmonary effort is normal.  Abdominal:     General: There is no distension.  Musculoskeletal: Normal range of motion.  Skin:    General: Skin is warm.  Neurological:     Mental Status: She is alert and oriented to person, place, and time.  Psychiatric:        Mood and Affect: Mood normal.      UC Treatments / Results  Labs (all labs ordered are listed, but only abnormal results are displayed) Labs Reviewed - No data to display  EKG None  Radiology No results found.  Procedures Procedures (including critical care time)  Medications Ordered in UC Medications  acetaminophen (TYLENOL) tablet 650 mg (650 mg Oral Given 03/04/18 1204)    Initial Impression /  Assessment and Plan / UC Course  I have reviewed the triage vital signs and the nursing notes.  Pertinent labs & imaging results  that were available during my care of the patient were reviewed by me and considered in my medical decision making (see chart for details).     MDM   Pt's symptoms consistent with influenza  Final Clinical Impressions(s) / UC Diagnoses   Final diagnoses:  Influenza-like illness     Discharge Instructions     Return if any problems.     ED Prescriptions    Medication Sig Dispense Auth. Provider   ibuprofen (ADVIL,MOTRIN) 800 MG tablet Take 1 tablet (800 mg total) by mouth 3 (three) times daily. 21 tablet Sofia, Leslie K, [place]-C   oseltamivir (TAMIFLU) 75 MG capsule Take 1 capsule (75 mg total) by mouth every 12 (twelve) hours. 10 capsule Elson AreasSofia, Leslie K, [place]-C     Controlled Substance Prescriptions [place] Controlled Substance Registry consulted? Not Applicable  An After Visit Summary was printed and given to the patient.    Elson AreasSofia, Leslie K, [place]-C 03/04/18 1300

## 2018-03-04 NOTE — ED Triage Notes (Signed)
Pt here for flu like sx with fever

## 2018-04-23 ENCOUNTER — Emergency Department (HOSPITAL_COMMUNITY)
Admission: EM | Admit: 2018-04-23 | Discharge: 2018-04-24 | Disposition: A | Discharge status: Home / Self Care | Payer: Medicaid Other | Attending: Emergency Medicine | Admitting: Emergency Medicine

## 2018-04-23 ENCOUNTER — Encounter (HOSPITAL_COMMUNITY): Payer: Self-pay

## 2018-04-23 DIAGNOSIS — R05 Cough: Secondary | ICD-10-CM | POA: Diagnosis present

## 2018-04-23 DIAGNOSIS — I10 Essential (primary) hypertension: Secondary | ICD-10-CM | POA: Insufficient documentation

## 2018-04-23 DIAGNOSIS — F17210 Nicotine dependence, cigarettes, uncomplicated: Secondary | ICD-10-CM | POA: Diagnosis not present

## 2018-04-23 DIAGNOSIS — E119 Type 2 diabetes mellitus without complications: Secondary | ICD-10-CM | POA: Insufficient documentation

## 2018-04-23 DIAGNOSIS — J069 Acute upper respiratory infection, unspecified: Secondary | ICD-10-CM

## 2018-04-23 DIAGNOSIS — B9789 Other viral agents as the cause of diseases classified elsewhere: Secondary | ICD-10-CM | POA: Diagnosis not present

## 2018-04-23 LAB — GROUP A STREP BY PCR: Group A Strep by PCR: NOT DETECTED

## 2018-04-23 NOTE — ED Triage Notes (Signed)
Pt arrived with complaints of a sore throat that started today. Pt states she has had a cough.

## 2018-04-24 ENCOUNTER — Other Ambulatory Visit: Payer: Self-pay

## 2018-04-24 LAB — CBG MONITORING, ED: Glucose-Capillary: 96 mg/dL (ref 70–99)

## 2018-04-24 MED ORDER — LIDOCAINE VISCOUS HCL 2 % MT SOLN: 15.0000 mL | OROMUCOSAL | 0 refills | Status: DC | PRN

## 2018-04-24 MED ORDER — DEXAMETHASONE SODIUM PHOSPHATE 10 MG/ML IJ SOLN
10.0000 mg | Freq: Once | INTRAMUSCULAR | Status: AC
  Administered 2018-04-24: 10 mg via INTRAVENOUS
  Filled 2018-04-24: qty 1

## 2018-04-24 MED ORDER — KETOROLAC TROMETHAMINE 60 MG/2ML IM SOLN
30.0000 mg | Freq: Once | INTRAMUSCULAR | Status: AC
  Administered 2018-04-24: 30 mg via INTRAMUSCULAR
  Filled 2018-04-24: qty 2

## 2018-04-24 MED ORDER — PROMETHAZINE-DM 6.25-15 MG/5ML PO SYRP: 5.0000 mL | ORAL_SOLUTION | Freq: Four times a day (QID) | ORAL | 0 refills | Status: DC | PRN

## 2018-04-24 NOTE — ED Provider Notes (Addendum)
[facility]-EMERGENCY DEPT Provider Note   CSN: [id number] Arrival date & time: 04/23/18  2037    History   Chief Complaint Chief Complaint  Patient presents with  . Sore Throat    HPI Kayla Flynn is a 28 y.o. female with a history of hypertension, gestational diabetes, prediabetes on metformin, and morbid obesity who presents the emergency department with a chief complaint of sore throat accompanied by headache, subjective fever, body aches, chills, sinus pain and pressure, nasal congestion, and cough for the last 48 hours.  She states the symptoms began suddenly.  Sore throat is bilateral.  Pain is worse with swallowing.  She denies trismus, drooling, throat closing.  No shortness of breath, chest pain, nausea, muffled voice, vomiting, otalgia, diarrhea, abdominal pain, neck pain, or stiffness.  She reports that she has not felt like eating and drinking most of the day because of the pain with swallowing.  She reports that she took Tamiflu in December after she was seen at urgent care.  She cannot recall if they tested her flu or influenza.     The history is provided by the patient. No language interpreter was used.    Past Medical History:  Diagnosis Date  . Acute appendicitis 07/18/2012  . GDM (gestational diabetes mellitus)   . Hypertension   . Obesity   . Obesity, morbid, BMI 50 or higher (HCC) 07/18/2012  . Pre-eclampsia   . Prediabetes    "prediabetic; controlled w/diet" (07/17/2012)  . Severe preeclampsia 12/10/2014    Patient Active Problem List   Diagnosis Date Noted  . Benign essential HTN 01/28/2015  . Hypertension in pregnancy 12/15/2014  . History of syphilis 08/11/2014  . Maternal varicella, non-immune 07/21/2014  . 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 S/P cesarean section 07/14/2014  . Obesity, morbid, BMI 82 (HCC) 07/18/2012  . DM type 2 (diabetes mellitus, type 2) (HCC) 10/12/2010     Past Surgical History:  Procedure Laterality Date  . APPENDECTOMY  07/17/2012  . CESAREAN SECTION N/A 12/18/2014   Procedure: CESAREAN SECTION;  Surgeon: Tereso NewcomerUgonna A Anyanwu, MD;  Location: [facility];  Service: Obstetrics;  Laterality: N/A;  . LAPAROSCOPIC APPENDECTOMY N/A 07/17/2012   Procedure: APPENDECTOMY LAPAROSCOPIC;  Surgeon: Liz MaladyBurke E Thompson, MD;  Location: [facility];  Service: General;  Laterality: N/A;     OB History    Gravida  1   Para  1   Term  1   Preterm      AB      Living  1     SAB      TAB      Ectopic      Multiple  0   Live Births  1            Home Medications    Prior to Admission medications   Medication Sig Start Date End Date Taking? Authorizing Provider  acetaminophen (TYLENOL) 500 MG tablet Take 1,000 mg by mouth every 6 (six) hours as needed for mild pain, moderate pain or headache.     [provider]  albuterol (PROVENTIL HFA;VENTOLIN HFA) 108 (90 Base) MCG/ACT inhaler Inhale 1-2 puffs into the lungs every 6 (six) hours as needed for wheezing or shortness of breath. 01/21/18   Georgetta HaberBurky, Natalie B, NP  azithromycin (ZITHROMAX) 500 MG tablet Take 2 at ones Patient not taking: Reported on 03/04/2018 02/07/18   Rodriguez-Southworth, Nettie ElmSylvia, PA-C  ibuprofen (ADVIL,MOTRIN) 800 MG tablet Take 1 tablet (  800 mg total) by mouth 3 (three) times daily. 03/04/18   Elson Areas, PA-C  lidocaine (XYLOCAINE) 2 % solution Use as directed 15 mLs in the mouth or throat every 3 (three) hours as needed for mouth pain. 04/24/18   Yousif Edelson A, PA-C  metroNIDAZOLE (FLAGYL) 500 MG tablet Take 1 tablet (500 mg total) by mouth 2 (two) times daily. Patient not taking: Reported on 03/04/2018 02/07/18   Rodriguez-Southworth, Nettie Elm, PA-C  oseltamivir (TAMIFLU) 75 MG capsule Take 1 capsule (75 mg total) by mouth every 12 (twelve) hours. 03/04/18   Elson Areas, PA-C  promethazine-dextromethorphan (PROMETHAZINE-DM) 6.25-15 MG/5ML syrup Take 5 mLs by mouth 4  (four) times daily as needed for cough. 04/24/18   Kaleth Koy A, PA-C    Family History Family History  Problem Relation Age of Onset  . Hypertension Mother   . Diabetes Mother   . Hypertension Maternal Grandmother     Social History Social History   Tobacco Use  . Smoking status: Current Every Day Smoker    Packs/day: 0.25    Years: 1.20    Pack years: 0.30    Types: Cigarettes  . Smokeless tobacco: Former Neurosurgeon    Quit date: 12/10/2014  Substance Use Topics  . Alcohol use: No    Comment: last time couple wks ago but not since finding out was pregnant  . Drug use: No     Allergies   Patient has no known allergies.   Review of Systems Review of Systems  Constitutional: Positive for chills. Negative for activity change and fever.  HENT: Positive for congestion, ear pain, sinus pressure, sinus pain and sore throat. Negative for drooling, rhinorrhea, trouble swallowing and voice change.   Eyes: Negative for visual disturbance.  Respiratory: Negative for shortness of breath and wheezing.   Cardiovascular: Negative for chest pain, palpitations and leg swelling.  Gastrointestinal: Negative for abdominal pain, diarrhea, nausea and vomiting.  Genitourinary: Negative for dysuria.  Musculoskeletal: Positive for myalgias. Negative for back pain, gait problem, neck pain and neck stiffness.  Skin: Negative for rash.  Allergic/Immunologic: Negative for immunocompromised state.  Neurological: Positive for headaches. Negative for dizziness, weakness and numbness.  Psychiatric/Behavioral: Negative for confusion.     Physical Exam Updated Vital Signs BP (!) 156/106 (BP Location: Left Arm)   Pulse 86   Temp 99.1 F (37.3 C) (Oral)   Resp 18   Ht 4' 9.75" (1.467 m)   Wt (!) 154.2 kg   LMP 04/03/2018   SpO2 99%   BMI 71.68 kg/m   Physical Exam Vitals signs and nursing note reviewed.  Constitutional:      General: She is not in acute distress.    Comments: Well  appearing. No acute distress.   HENT:     Head: Normocephalic.     Right Ear: Hearing normal. No middle ear effusion. There is no impacted cerumen. No mastoid tenderness. Tympanic membrane is bulging. Tympanic membrane is not injected.     Left Ear: Hearing normal.  No middle ear effusion. There is no impacted cerumen. No mastoid tenderness. Tympanic membrane is bulging. Tympanic membrane is not injected.     Nose: Congestion present. No signs of injury or nasal tenderness.     Right Sinus: Maxillary sinus tenderness present. No frontal sinus tenderness.     Left Sinus: Maxillary sinus tenderness present. No frontal sinus tenderness.     Mouth/Throat:     Mouth: Mucous membranes are moist.  Pharynx: Oropharynx is clear. Uvula midline. Posterior oropharyngeal erythema present. No pharyngeal swelling, oropharyngeal exudate or uvula swelling.     Tonsils: No tonsillar exudate or tonsillar abscesses. Swelling: 1+ on the right. 1+ on the left.  Eyes:     Conjunctiva/sclera: Conjunctivae normal.  Neck:     Musculoskeletal: Neck supple.  Cardiovascular:     Rate and Rhythm: Normal rate and regular rhythm.     Heart sounds: No murmur. No friction rub. No gallop.   Pulmonary:     Effort: Pulmonary effort is normal. No respiratory distress.     Breath sounds: No stridor. No wheezing, rhonchi or rales.  Chest:     Chest wall: No tenderness.  Abdominal:     General: There is no distension.     Palpations: Abdomen is soft.  Skin:    General: Skin is warm.     Findings: No rash.  Neurological:     Mental Status: She is alert.  Psychiatric:        Behavior: Behavior normal.      ED Treatments / Results  Labs (all labs ordered are listed, but only abnormal results are displayed) Labs Reviewed  GROUP A STREP BY PCR  CBG MONITORING, ED    EKG None  Radiology No results found.  Procedures Procedures (including critical care time)  Medications Ordered in ED Medications   ketorolac (TORADOL) injection 30 mg (30 mg Intramuscular Given 04/24/18 0105)  dexamethasone (DECADRON) injection 10 mg (10 mg Intravenous Given 04/24/18 0105)     Initial Impression / Assessment and Plan / ED Course  I have reviewed the triage vital signs and the nursing notes.  Pertinent labs & imaging results that were available during my care of the patient were reviewed by me and considered in my medical decision making (see chart for details).        28 year old female with URI symptoms including sore throat, body aches, myalgias, subjective fever, chills, nasal congestion, and otalgia for 48 hours.  She is afebrile in the ER.  Strep PCR is negative.  Lungs are clear to auscultation bilaterally.  She is well-appearing.  She takes metformin for prediabetes at home and CBG was 96 in the ER.  Suspect viral URI versus influenza.  Low suspicion for peritonsillar abscess or deep space infection. She completed a course of Tamiflu after she was treated empirically for influenza at urgent care in December.  Shared decision making conversation.  She does not wish to repeat a course of Tamiflu.  Influenza panel testing is not indicated at this time.  She is agreeable with symptomatic treatment at home.  Will give Decadron and Toradol to help with pain and inflammation from sore throat in the ER.  Low suspicion for pneumonia as she has no meningeal signs to be suspicious for meningitis.  She is tolerating fluids without difficulty in the ER.  Strict return precautions given.  She is hemodynamically stable and in no acute distress.  She is safe for discharge home with outpatient follow-up at this time.  Final Clinical Impressions(s) / ED Diagnoses   Final diagnoses:  Viral URI with cough    ED Discharge Orders         Ordered    promethazine-dextromethorphan (PROMETHAZINE-DM) 6.25-15 MG/5ML syrup  4 times daily PRN     04/24/18 0042    lidocaine (XYLOCAINE) 2 % solution  Every  3 hours PRN      04/24/18 0042  Frederik Pear A, PA-C 04/24/18 0041    Barkley Boards, PA-C 04/24/18 9675    Devoria Albe, MD 04/24/18 [phone]

## 2018-04-24 NOTE — Discharge Instructions (Signed)
Thank you for allowing me to care for you today in the Emergency Department.   Your symptoms are consistent with a viral illness.  Antibiotics are not indicated at this time.  For fever and body aches, take 650 mg of Tylenol once every 6 hours or 600 mg of ibuprofen with food every 6 hours.  You can also alternate between these 2 medications every 3 hours.  You can swallow by mouth 5 mL's of Promethazine DM every 6 hours as needed for cough and nasal congestion.  You can swallow.  You can swallow 15 mL's of Promethazine DM every 3 hours as needed for sore throat.  Call the number on your discharge paperwork if you would like to get established with a new primary care provider.  Follow-up if your symptoms do not start to significantly improve in the next 5 to 7 days.  Return to the emergency department if you become unable to swallow, if you develop severe shortness of breath, high fever that does not improve with Tylenol or ibuprofen, vomiting, or other new, concerning symptoms.

## 2018-04-29 ENCOUNTER — Ambulatory Visit (INDEPENDENT_AMBULATORY_CARE_PROVIDER_SITE_OTHER): Payer: Medicaid Other

## 2018-04-29 ENCOUNTER — Encounter (HOSPITAL_COMMUNITY): Payer: Self-pay | Admitting: Emergency Medicine

## 2018-04-29 ENCOUNTER — Other Ambulatory Visit: Payer: Self-pay

## 2018-04-29 ENCOUNTER — Ambulatory Visit (HOSPITAL_COMMUNITY)
Admission: EM | Admit: 2018-04-29 | Discharge: 2018-04-29 | Disposition: A | Discharge status: Home / Self Care | Payer: Medicaid Other | Attending: Internal Medicine | Admitting: Internal Medicine

## 2018-04-29 DIAGNOSIS — J069 Acute upper respiratory infection, unspecified: Secondary | ICD-10-CM | POA: Diagnosis not present

## 2018-04-29 DIAGNOSIS — R0602 Shortness of breath: Secondary | ICD-10-CM | POA: Diagnosis not present

## 2018-04-29 DIAGNOSIS — J209 Acute bronchitis, unspecified: Secondary | ICD-10-CM

## 2018-04-29 DIAGNOSIS — R05 Cough: Secondary | ICD-10-CM | POA: Diagnosis not present

## 2018-04-29 MED ORDER — ALBUTEROL SULFATE HFA 108 (90 BASE) MCG/ACT IN AERS: 1.0000 | INHALATION_SPRAY | Freq: Four times a day (QID) | RESPIRATORY_TRACT | 0 refills | Status: DC | PRN

## 2018-04-29 MED ORDER — PREDNISONE 10 MG PO TABS: 20.0000 mg | ORAL_TABLET | Freq: Every day | ORAL | 0 refills | Status: AC

## 2018-04-29 NOTE — ED Provider Notes (Addendum)
[facility]    CSN: [id number] Arrival date & time: 04/29/18  1206     History   Chief Complaint Chief Complaint  Patient presents with  . Cough    HPI Kayla Flynn is a 28 y.o. female history of morbid obesity, hypertension and prediabetes comes to urgent care with complaints of persistent cough of over 10 days duration.  Patient notes that the cough started about 10 days ago with runny nose and a sore throat.  She was seen at [facility] ED and was managed symptomatically.  Runny nose has improved but the cough continues to be persistent.  Cough is productive of clear sputum.  She also complains of sore throat without difficulty swallowing.  She has subjective fever with chills.  She admits having some shortness of breath with intermittent wheezing.  She went to the smoke and she continues to smoke through her illness.  No nausea vomiting or diarrhea.  No sick contacts.  No recent travel.Marland Kitchen   HPI  Past Medical History:  Diagnosis Date  . Acute appendicitis 07/18/2012  . GDM (gestational diabetes mellitus)   . Hypertension   . Obesity   . Obesity, morbid, BMI 50 or higher (HCC) 07/18/2012  . Pre-eclampsia   . Prediabetes    "prediabetic; controlled w/diet" (07/17/2012)  . Severe preeclampsia 12/10/2014    Patient Active Problem List   Diagnosis Date Noted  . Benign essential HTN 01/28/2015  . Hypertension in pregnancy 12/15/2014  . History of syphilis 08/11/2014  . Maternal varicella, non-immune 07/21/2014  . 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 S/P cesarean section 07/14/2014  . Obesity, morbid, BMI 82 (HCC) 07/18/2012  . DM type 2 (diabetes mellitus, type 2) (HCC) 10/12/2010    Past Surgical History:  Procedure Laterality Date  . APPENDECTOMY  07/17/2012  . CESAREAN SECTION N/A 12/18/2014   Procedure: CESAREAN SECTION;  Surgeon: Tereso Newcomer, MD;  Location: [facility];  Service: Obstetrics;  Laterality:  N/A;  . LAPAROSCOPIC APPENDECTOMY N/A 07/17/2012   Procedure: APPENDECTOMY LAPAROSCOPIC;  Surgeon: Liz Malady, MD;  Location: [facility];  Service: General;  Laterality: N/A;    OB History    Gravida  1   Para  1   Term  1   Preterm      AB      Living  1     SAB      TAB      Ectopic      Multiple  0   Live Births  1            Home Medications    Prior to Admission medications   Medication Sig Start Date End Date Taking? Authorizing Provider  acetaminophen (TYLENOL) 500 MG tablet Take 1,000 mg by mouth every 6 (six) hours as needed for mild pain, moderate pain or headache.    Yes [provider]  ibuprofen (ADVIL,MOTRIN) 800 MG tablet Take 1 tablet (800 mg total) by mouth 3 (three) times daily. 03/04/18  Yes Cheron Schaumann K, PA-C  albuterol (PROVENTIL HFA;VENTOLIN HFA) 108 (90 Base) MCG/ACT inhaler Inhale 1-2 puffs into the lungs every 6 (six) hours as needed for wheezing or shortness of breath. 04/29/18   Lamptey, Britta Mccreedy, MD  predniSONE (DELTASONE) 10 MG tablet Take 2 tablets (20 mg total) by mouth daily for 5 days. 04/29/18 05/04/18  Merrilee Jansky, MD    Family History Family History  Problem Relation Age of  Onset  . Hypertension Mother   . Diabetes Mother   . Hypertension Maternal Grandmother     Social History Social History   Tobacco Use  . Smoking status: Current Every Day Smoker    Packs/day: 0.25    Years: 1.20    Pack years: 0.30    Types: Cigarettes  . Smokeless tobacco: Former Neurosurgeon    Quit date: 12/10/2014  Substance Use Topics  . Alcohol use: No    Comment: last time couple wks ago but not since finding out was pregnant  . Drug use: No     Allergies   Patient has no known allergies.   Review of Systems Review of Systems  Constitutional: Positive for activity change, chills, fatigue and fever. Negative for appetite change.  HENT: Positive for congestion, rhinorrhea and sore throat. Negative for hearing loss, mouth  sores, nosebleeds, postnasal drip, sinus pressure and sinus pain.   Eyes: Positive for pain and discharge. Negative for redness.  Respiratory: Positive for cough, chest tightness, shortness of breath and wheezing. Negative for stridor.   Gastrointestinal: Negative for abdominal distention and abdominal pain.  Genitourinary: Negative for dysuria, frequency and urgency.  Musculoskeletal: Negative for arthralgias, back pain and myalgias.  Neurological: Positive for headaches. Negative for dizziness, light-headedness and numbness.  Hematological: Negative for adenopathy.     Physical Exam Triage Vital Signs ED Triage Vitals  Enc Vitals Group     BP 04/29/18 1231 (!) 158/88     Pulse Rate 04/29/18 1231 95     Resp 04/29/18 1231 20     Temp 04/29/18 1231 98.2 F (36.8 C)     Temp Source 04/29/18 1231 Temporal     SpO2 04/29/18 1231 100 %     Weight --      Height --      Head Circumference --      Peak Flow --      Pain Score 04/29/18 1230 7     Pain Loc --      Pain Edu? --      Excl. in GC? --    No data found.  Updated Vital Signs BP (!) 158/88 (BP Location: Right Arm)   Pulse 95   Temp 98.2 F (36.8 C) (Temporal)   Resp 20   LMP 04/03/2018   SpO2 100%   Visual Acuity Right Eye Distance:   Left Eye Distance:   Bilateral Distance:    Right Eye Near:   Left Eye Near:    Bilateral Near:     Physical Exam Vitals signs and nursing note reviewed.  Constitutional:      Appearance: She is obese. She is ill-appearing.  HENT:     Right Ear: Tympanic membrane normal.     Left Ear: Tympanic membrane normal.     Nose: Congestion present.     Mouth/Throat:     Mouth: Mucous membranes are moist.     Pharynx: Posterior oropharyngeal erythema present.  Eyes:     Conjunctiva/sclera: Conjunctivae normal.     Comments: Mild left conjunctival erythema.  Neck:     Musculoskeletal: Normal range of motion and neck supple. No neck rigidity.  Cardiovascular:     Rate and  Rhythm: Normal rate and regular rhythm.     Pulses: Normal pulses.  Pulmonary:     Effort: Pulmonary effort is normal.     Breath sounds: Normal breath sounds.  Abdominal:     Palpations: Abdomen is soft.  Tenderness: There is no abdominal tenderness. There is no rebound.  Musculoskeletal: Normal range of motion.        General: No swelling, tenderness or signs of injury.  Lymphadenopathy:     Cervical: Cervical adenopathy present.  Skin:    General: Skin is warm.     Capillary Refill: Capillary refill takes less than 2 seconds.  Neurological:     General: No focal deficit present.     Mental Status: She is alert and oriented to person, place, and time.      UC Treatments / Results  Labs (all labs ordered are listed, but only abnormal results are displayed) Labs Reviewed - No data to display  EKG None  Radiology No results found.  Procedures Procedures (including critical care time)  Medications Ordered in UC Medications - No data to display  Initial Impression / Assessment and Plan / UC Course  I have reviewed the triage vital signs and the nursing notes.  Pertinent labs & imaging results that were available during my care of the patient were reviewed by me and considered in my medical decision making (see chart for details).     1.  Acute bronchitis with bronchospasm: Albuterol inhaler Chest x-ray: Viewed independently by me.  No acute lung infiltrate noted.  Right diaphragm is elevated. Prednisone Smoke cessation advice given  2.  Morbid obesity Weight loss advised  Final Clinical Impressions(s) / UC Diagnoses   Final diagnoses:  Acute upper respiratory infection  Acute bronchitis, unspecified organism   Discharge Instructions   None    ED Prescriptions    Medication Sig Dispense Auth. Provider   albuterol (PROVENTIL HFA;VENTOLIN HFA) 108 (90 Base) MCG/ACT inhaler Inhale 1-2 puffs into the lungs every 6 (six) hours as needed for wheezing or  shortness of breath. 1 Inhaler Lamptey, Britta Mccreedy, MD   predniSONE (DELTASONE) 10 MG tablet Take 2 tablets (20 mg total) by mouth daily for 5 days. 10 tablet Lamptey, Britta Mccreedy, MD     Controlled Substance Prescriptions [place] Controlled Substance Registry consulted? No   Merrilee Jansky, MD 04/29/18 1327    Merrilee Jansky, MD 04/29/18 1344

## 2018-04-29 NOTE — ED Triage Notes (Signed)
The patient presented to the [facility] with a complaint of a cough x 1.5 weeks. The patient was evaluated at the [facility] for the same complaint.

## 2018-09-03 ENCOUNTER — Other Ambulatory Visit: Payer: Self-pay

## 2018-09-03 ENCOUNTER — Ambulatory Visit (HOSPITAL_COMMUNITY)
Admission: EM | Admit: 2018-09-03 | Discharge: 2018-09-03 | Disposition: A | Discharge status: Home / Self Care | Payer: Medicaid Other | Attending: Emergency Medicine | Admitting: Emergency Medicine

## 2018-09-03 ENCOUNTER — Encounter (HOSPITAL_COMMUNITY): Payer: Self-pay

## 2018-09-03 DIAGNOSIS — R1030 Lower abdominal pain, unspecified: Secondary | ICD-10-CM

## 2018-09-03 DIAGNOSIS — Z113 Encounter for screening for infections with a predominantly sexual mode of transmission: Secondary | ICD-10-CM

## 2018-09-03 DIAGNOSIS — Z8619 Personal history of other infectious and parasitic diseases: Secondary | ICD-10-CM | POA: Diagnosis not present

## 2018-09-03 DIAGNOSIS — Z202 Contact with and (suspected) exposure to infections with a predominantly sexual mode of transmission: Secondary | ICD-10-CM | POA: Diagnosis not present

## 2018-09-03 LAB — POCT URINALYSIS DIP (DEVICE)
Bilirubin Urine: NEGATIVE
Glucose, UA: NEGATIVE mg/dL
Ketones, ur: NEGATIVE mg/dL
Leukocytes,Ua: NEGATIVE
Nitrite: NEGATIVE
Protein, ur: NEGATIVE mg/dL
Specific Gravity, Urine: >=1.03 (ref 1.005–1.030)
Urobilinogen, UA: 0.2 mg/dL (ref 0.0–1.0)
pH: 6 (ref 5.0–8.0)

## 2018-09-03 MED ORDER — AZITHROMYCIN 250 MG PO TABS
ORAL_TABLET | ORAL | Status: AC
  Filled 2018-09-03: qty 4

## 2018-09-03 MED ORDER — CEFTRIAXONE SODIUM 250 MG IJ SOLR
INTRAMUSCULAR | Status: AC
  Filled 2018-09-03: qty 250

## 2018-09-03 MED ORDER — LIDOCAINE HCL (PF) 1 % IJ SOLN
INTRAMUSCULAR | Status: AC
  Filled 2018-09-03: qty 2

## 2018-09-03 MED ORDER — CEFTRIAXONE SODIUM 250 MG IJ SOLR
250.0000 mg | Freq: Once | INTRAMUSCULAR | Status: AC
  Administered 2018-09-03: 250 mg via INTRAMUSCULAR

## 2018-09-03 MED ORDER — AZITHROMYCIN 250 MG PO TABS
1000.0000 mg | ORAL_TABLET | Freq: Once | ORAL | Status: AC
  Administered 2018-09-03: 1000 mg via ORAL

## 2018-09-03 NOTE — ED Triage Notes (Signed)
Pt presents with lower/pelvic abdominal pain and some mild nausea.  Pt states she would like a pregnancy test and STD testing.

## 2018-09-03 NOTE — Discharge Instructions (Addendum)
Your urine test is negative today.  Your other tests are pending but you have been given treatment today with antibiotics.  You will not need additional medications unless additional tests come back positive.  We will call you if additional medication is needed.

## 2018-09-03 NOTE — ED Provider Notes (Signed)
[facility]    CSN: [id number] Arrival date & time: 09/03/18  [id number]     History   Chief Complaint Chief Complaint  Patient presents with  . Abdominal Pain  . STD testing/Pregnancy Test    HPI Kayla Flynn is a 28 y.o. female.   She presents today with dull intermittent lower abdominal pain for 1 week and would like to be tested for an STD as she states she has a history of this and has been exposed again; her abdominal discomfort feels like her previous STD.  She requests a pelvic exam.  Her history includes gonorrhea and chlamydia.  Her last menstrual period was in February and she reports these are irregular.  She denies vaginal discharge, dysuria, nausea, vomiting, diarrhea, constipation, fever, or chills.  The history is provided by the patient. No language interpreter was used.    Past Medical History:  Diagnosis Date  . Acute appendicitis 07/18/2012  . GDM (gestational diabetes mellitus)   . Hypertension   . Obesity   . Obesity, morbid, BMI 50 or higher (HCC) 07/18/2012  . Pre-eclampsia   . Prediabetes    "prediabetic; controlled w/diet" (07/17/2012)  . Severe preeclampsia 12/10/2014    Patient Active Problem List   Diagnosis Date Noted  . Benign essential HTN 01/28/2015  . Hypertension in pregnancy 12/15/2014  . History of syphilis 08/11/2014  . Maternal varicella, non-immune 07/21/2014  . 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 S/P cesarean section 07/14/2014  . Obesity, morbid, BMI 82 (HCC) 07/18/2012  . DM type 2 (diabetes mellitus, type 2) (HCC) 10/12/2010    Past Surgical History:  Procedure Laterality Date  . APPENDECTOMY  07/17/2012  . CESAREAN SECTION N/A 12/18/2014   Procedure: CESAREAN SECTION;  Surgeon: Tereso NewcomerUgonna A Anyanwu, MD;  Location: [facility];  Service: Obstetrics;  Laterality: N/A;  . LAPAROSCOPIC APPENDECTOMY N/A 07/17/2012   Procedure: APPENDECTOMY LAPAROSCOPIC;  Surgeon: Liz MaladyBurke E Thompson, MD;   Location: [facility];  Service: General;  Laterality: N/A;    OB History    Gravida  1   Para  1   Term  1   Preterm      AB      Living  1     SAB      TAB      Ectopic      Multiple  0   Live Births  1            Home Medications    Prior to Admission medications   Medication Sig Start Date End Date Taking? Authorizing Provider  acetaminophen (TYLENOL) 500 MG tablet Take 1,000 mg by mouth every 6 (six) hours as needed for mild pain, moderate pain or headache.     [provider]  albuterol (PROVENTIL HFA;VENTOLIN HFA) 108 (90 Base) MCG/ACT inhaler Inhale 1-2 puffs into the lungs every 6 (six) hours as needed for wheezing or shortness of breath. 04/29/18   Merrilee JanskyLamptey, Philip O, MD  ibuprofen (ADVIL,MOTRIN) 800 MG tablet Take 1 tablet (800 mg total) by mouth 3 (three) times daily. 03/04/18   Elson AreasSofia, Leslie K, PA-C    Family History Family History  Problem Relation Age of Onset  . Hypertension Mother   . Diabetes Mother   . Hypertension Maternal Grandmother     Social History Social History   Tobacco Use  . Smoking status: Current Every Day Smoker    Packs/day: 0.25    Years: 1.20  Pack years: 0.30    Types: Cigarettes  . Smokeless tobacco: Former NeurosurgeonUser    Quit date: 12/10/2014  Substance Use Topics  . Alcohol use: No    Comment: last time couple wks ago but not since finding out was pregnant  . Drug use: No     Allergies   Patient has no known allergies.   Review of Systems Review of Systems  Constitutional: Negative for chills and fever.  HENT: Negative for ear pain and sore throat.   Eyes: Negative for pain and visual disturbance.  Respiratory: Negative for cough and shortness of breath.   Cardiovascular: Negative for chest pain and palpitations.  Gastrointestinal: Positive for abdominal pain. Negative for vomiting.  Genitourinary: Negative for dysuria and hematuria.  Musculoskeletal: Negative for arthralgias and back pain.  Skin:  Negative for color change and rash.  Neurological: Negative for seizures and syncope.  All other systems reviewed and are negative.    Physical Exam Triage Vital Signs ED Triage Vitals  Enc Vitals Group     BP 09/03/18 0917 102/75     Pulse Rate 09/03/18 0917 83     Resp 09/03/18 0917 18     Temp 09/03/18 0917 98.2 F (36.8 C)     Temp Source 09/03/18 0917 Oral     SpO2 09/03/18 0917 100 %     Weight --      Height --      Head Circumference --      Peak Flow --      Pain Score 09/03/18 0920 6     Pain Loc --      Pain Edu? --      Excl. in GC? --    No data found.  Updated Vital Signs BP 102/75 (BP Location: Right Arm)   Pulse 83   Temp 98.2 F (36.8 C) (Oral)   Resp 18   LMP 04/25/2018   SpO2 100%   Visual Acuity Right Eye Distance:   Left Eye Distance:   Bilateral Distance:    Right Eye Near:   Left Eye Near:    Bilateral Near:     Physical Exam Vitals signs and nursing note reviewed. Exam conducted with a chaperone present.  Constitutional:      General: She is not in acute distress.    Appearance: She is well-developed.  HENT:     Head: Normocephalic and atraumatic.  Eyes:     Conjunctiva/sclera: Conjunctivae normal.  Neck:     Musculoskeletal: Neck supple.  Cardiovascular:     Rate and Rhythm: Normal rate and regular rhythm.  Pulmonary:     Effort: Pulmonary effort is normal. No respiratory distress.     Breath sounds: Normal breath sounds.  Abdominal:     Palpations: Abdomen is soft.     Tenderness: There is no abdominal tenderness.  Genitourinary:    General: Normal vulva.     Pubic Area: No rash.      Urethra: No urethral pain or urethral lesion.     Vagina: Normal.     Cervix: Normal.     Uterus: Normal.      Adnexa: Right adnexa normal and left adnexa normal.  Skin:    General: Skin is warm and dry.  Neurological:     Mental Status: She is alert.      UC Treatments / Results  Labs (all labs ordered are listed, but only  abnormal results are displayed) Labs Reviewed  POCT URINALYSIS DIP (DEVICE) -  Abnormal; Notable for the following components:      Result Value   Hgb urine dipstick TRACE (*)    All other components within normal limits  CERVICOVAGINAL ANCILLARY ONLY    EKG None  Radiology No results found.  Procedures Procedures (including critical care time)  Medications Ordered in UC Medications  azithromycin (ZITHROMAX) tablet 1,000 mg (1,000 mg Oral Given 09/03/18 1007)  cefTRIAXone (ROCEPHIN) injection 250 mg (250 mg Intramuscular Given 09/03/18 1007)  azithromycin (ZITHROMAX) 250 MG tablet (has no administration in time range)  cefTRIAXone (ROCEPHIN) 250 MG injection (has no administration in time range)  lidocaine (PF) (XYLOCAINE) 1 % injection (has no administration in time range)    Initial Impression / Assessment and Plan / UC Course  I have reviewed the triage vital signs and the nursing notes.  Pertinent labs & imaging results that were available during my care of the patient were reviewed by me and considered in my medical decision making (see chart for details).   Lower abdominal pain and STD exposure.  Treated today with Rocephin and Zithromax.  Urine negative.  GU exam unremarkable.  STD testing sent; discussed with patient that we would be in contact with her if additional medications were needed.  Discussed safe sex practices and that sex partners would need to be treated if any tests come back positive.  Follow-up with primary care provider as needed.  Final Clinical Impressions(s) / UC Diagnoses   Final diagnoses:  Lower abdominal pain  Exposure to sexually transmitted disease (STD)     Discharge Instructions     Your urine test is negative today.  Your other tests are pending but you have been given treatment today with antibiotics.  You will not need additional medications unless additional tests come back positive.  We will call you if additional medication is needed.     ED Prescriptions    None     Controlled Substance Prescriptions [place] Controlled Substance Registry consulted? Not Applicable   Sharion Balloon, NP 09/03/18 1051

## 2018-09-04 LAB — CERVICOVAGINAL ANCILLARY ONLY
Chlamydia: NEGATIVE
Neisseria Gonorrhea: NEGATIVE
Trichomonas: NEGATIVE

## 2018-11-22 ENCOUNTER — Ambulatory Visit
Admission: EM | Admit: 2018-11-22 | Discharge: 2018-11-22 | Disposition: A | Discharge status: Home / Self Care | Payer: Medicaid Other | Attending: Emergency Medicine | Admitting: Emergency Medicine

## 2018-11-22 ENCOUNTER — Other Ambulatory Visit: Payer: Self-pay

## 2018-11-22 DIAGNOSIS — B373 Candidiasis of vulva and vagina: Secondary | ICD-10-CM

## 2018-11-22 DIAGNOSIS — R739 Hyperglycemia, unspecified: Secondary | ICD-10-CM | POA: Diagnosis not present

## 2018-11-22 DIAGNOSIS — B3731 Acute candidiasis of vulva and vagina: Secondary | ICD-10-CM

## 2018-11-22 DIAGNOSIS — R7303 Prediabetes: Secondary | ICD-10-CM

## 2018-11-22 LAB — POCT URINALYSIS DIP (MANUAL ENTRY)
Bilirubin, UA: NEGATIVE
Glucose, UA: >=1000 mg/dL — AB
Ketones, POC UA: NEGATIVE mg/dL
Leukocytes, UA: NEGATIVE
Nitrite, UA: NEGATIVE
Protein Ur, POC: NEGATIVE mg/dL
Spec Grav, UA: 1.025 (ref 1.010–1.025)
Urobilinogen, UA: 0.2 E.U./dL
pH, UA: 6 (ref 5.0–8.0)

## 2018-11-22 LAB — POCT URINE PREGNANCY: Preg Test, Ur: NEGATIVE

## 2018-11-22 LAB — POCT FASTING CBG KUC MANUAL ENTRY: POCT Glucose (KUC): 315 mg/dL — AB (ref 70–99)

## 2018-11-22 MED ORDER — FLUCONAZOLE 200 MG PO TABS: 200.0000 mg | ORAL_TABLET | ORAL | 0 refills | Status: AC

## 2018-11-22 NOTE — ED Triage Notes (Signed)
Pt presents to UC stating she had vaginal itching x2 weeks. Itching has increased this week. Pt's states cottage cheese discharge x1 week ago. Pt states she has had a yeast infection last year.

## 2018-11-22 NOTE — ED Provider Notes (Signed)
[name]    CSN: [id number] Arrival date & time: 11/22/18  1622      History   Chief Complaint Chief Complaint  Patient presents with  . Vaginal Itching    HPI Kayla Flynn is a 28 y.o. female with history of morbid obesity, hypertension, prediabetes, not on routine medication, presenting for 2-week course of vaginal irritation, pruritus, and thick cottage cheese discharge for the last week.  Patient states this feels similar to previous vaginal yeast infections.  LMP 10/02/2018, currently sexually active with one female partner.  Not concerned about STDs today.  Patient also noting urinary frequency, sweet smell to her urine.  Feels this is been going on for greater than 2 weeks.  Patient does not currently have primary care due to lack of insurance, though recently became insured with new job.  Past Medical History:  Diagnosis Date  . Acute appendicitis 07/18/2012  . GDM (gestational diabetes mellitus)   . Hypertension   . Obesity   . Obesity, morbid, BMI 50 or higher (HCC) 07/18/2012  . Pre-eclampsia   . Prediabetes    "prediabetic; controlled w/diet" (07/17/2012)  . Severe preeclampsia 12/10/2014    Patient Active Problem List   Diagnosis Date Noted  . Benign essential HTN 01/28/2015  . Hypertension in pregnancy 12/15/2014  . History of syphilis 08/11/2014  . Maternal varicella, non-immune 07/21/2014  . 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 S/P cesarean section 07/14/2014  . Obesity, morbid, BMI 82 (HCC) 07/18/2012  . DM type 2 (diabetes mellitus, type 2) (HCC) 10/12/2010    Past Surgical History:  Procedure Laterality Date  . APPENDECTOMY  07/17/2012  . CESAREAN SECTION N/A 12/18/2014   Procedure: CESAREAN SECTION;  Surgeon: Tereso Newcomer, MD;  Location: [facility];  Service: Obstetrics;  Laterality: N/A;  . LAPAROSCOPIC APPENDECTOMY N/A 07/17/2012   Procedure: APPENDECTOMY LAPAROSCOPIC;  Surgeon: Liz Malady, MD;  Location: [facility];  Service: General;  Laterality: N/A;    OB History    Gravida  1   Para  1   Term  1   Preterm      AB      Living  1     SAB      TAB      Ectopic      Multiple  0   Live Births  1            Home Medications    Prior to Admission medications   Medication Sig Start Date End Date Taking? Authorizing Provider  acetaminophen (TYLENOL) 500 MG tablet Take 1,000 mg by mouth every 6 (six) hours as needed for mild pain, moderate pain or headache.     [provider]  albuterol (PROVENTIL HFA;VENTOLIN HFA) 108 (90 Base) MCG/ACT inhaler Inhale 1-2 puffs into the lungs every 6 (six) hours as needed for wheezing or shortness of breath. 04/29/18   Merrilee Jansky, MD  fluconazole (DIFLUCAN) 200 MG tablet Take 1 tablet (200 mg total) by mouth every other day for 3 doses. 11/22/18 11/27/18  Hall-Potvin, [place], PA-C  ibuprofen (ADVIL,MOTRIN) 800 MG tablet Take 1 tablet (800 mg total) by mouth 3 (three) times daily. 03/04/18   Elson Areas, PA-C    Family History Family History  Problem Relation Age of Onset  . Hypertension Mother   . Diabetes Mother   . Hypertension Maternal Grandmother     Social History Social History   Tobacco Use  .  Smoking status: Current Every Day Smoker    Packs/day: 0.25    Years: 1.20    Pack years: 0.30    Types: Cigarettes  . Smokeless tobacco: Former NeurosurgeonUser    Quit date: 12/10/2014  Substance Use Topics  . Alcohol use: No    Comment: rarely  . Drug use: No     Allergies   Patient has no known allergies.   Review of Systems Review of Systems  Constitutional: Negative for activity change, appetite change, fatigue and fever.  HENT: Negative for ear pain, sinus pain, sore throat and voice change.   Eyes: Negative for pain, redness and visual disturbance.  Respiratory: Negative for cough and shortness of breath.   Cardiovascular: Negative for chest pain and palpitations.   Gastrointestinal: Negative for abdominal pain, diarrhea, nausea and vomiting.  Endocrine: Negative for polydipsia, polyphagia and polyuria.  Genitourinary: Positive for frequency and vaginal discharge. Negative for dysuria, pelvic pain, urgency, vaginal bleeding and vaginal pain.  Musculoskeletal: Negative for arthralgias and myalgias.  Skin: Negative for rash and wound.  Neurological: Negative for dizziness, syncope, weakness, light-headedness and headaches.     Physical Exam Triage Vital Signs ED Triage Vitals  Enc Vitals Group     BP --      Pulse Rate 11/22/18 1627 77     Resp 11/22/18 1627 16     Temp 11/22/18 1627 98.9 F (37.2 C)     Temp Source 11/22/18 1627 Oral     SpO2 11/22/18 1627 96 %     Weight --      Height --      Head Circumference --      Peak Flow --      Pain Score 11/22/18 1633 1     Pain Loc --      Pain Edu? --      Excl. in GC? --    No data found.  Updated Vital Signs Pulse 77   Temp 98.9 F (37.2 C) (Oral)   Resp 16   LMP 10/02/2018   SpO2 96%   Visual Acuity Right Eye Distance:   Left Eye Distance:   Bilateral Distance:    Right Eye Near:   Left Eye Near:    Bilateral Near:     Physical Exam Constitutional:      General: She is not in acute distress.    Appearance: She is obese. She is not ill-appearing.  HENT:     Head: Normocephalic and atraumatic.     Mouth/Throat:     Mouth: Mucous membranes are moist.     Pharynx: Oropharynx is clear.  Eyes:     General: No scleral icterus.    Pupils: Pupils are equal, round, and reactive to light.  Neck:     Musculoskeletal: Normal range of motion. No muscular tenderness.  Cardiovascular:     Rate and Rhythm: Normal rate and regular rhythm.     Heart sounds: No murmur. No gallop.   Pulmonary:     Effort: Pulmonary effort is normal. No respiratory distress.     Breath sounds: No wheezing, rhonchi or rales.  Abdominal:     General: Bowel sounds are normal.     Tenderness: There is  no abdominal tenderness. There is no right CVA tenderness, left CVA tenderness or guarding.     Comments: Obese abdomen  Lymphadenopathy:     Cervical: No cervical adenopathy.  Skin:    General: Skin is warm.     Capillary  Refill: Capillary refill takes less than 2 seconds.     Coloration: Skin is not jaundiced or pale.     Findings: No rash.  Neurological:     General: No focal deficit present.     Mental Status: She is alert and oriented to person, place, and time.      UC Treatments / Results  Labs (all labs ordered are listed, but only abnormal results are displayed) Labs Reviewed  POCT URINALYSIS DIP (MANUAL ENTRY) - Abnormal; Notable for the following components:      Result Value   Glucose, UA >=1,000 (*)    Blood, UA trace-intact (*)    All other components within normal limits  POCT FASTING CBG [facility] MANUAL ENTRY - Abnormal; Notable for the following components:   POCT Glucose ([facility]) 315 (*)    All other components within normal limits  POCT URINE PREGNANCY    EKG   Radiology No results found.  Procedures Procedures (including critical care time)  Medications Ordered in UC Medications - No data to display  Initial Impression / Assessment and Plan / UC Course  I have reviewed the triage vital signs and the nursing notes.  Pertinent labs & imaging results that were available during my care of the patient were reviewed by me and considered in my medical decision making (see chart for details).     1.  Vaginal yeast infection POCT urinalysis done in office, reviewed by me: Positive for glucose, trace blood.  Urine pregnancy negative.  We will start Diflucan today.  2.  Hyperglycemia Patient with history of prediabetes, likely now diabetic.  Not currently on medications and has been without primary care for a while due to lack of insurance.  Patient requesting CBG: 315 (last ate 1.5 hours prior).  Patient denying malaise, chest pain, shortness of breath, nausea,  abdominal pain.  Provided patient with diabetic friendly diet, PCP contacted formation.  Return precautions discussed, patient verbalized understanding and is agreeable to plan. Final Clinical Impressions(s) / UC Diagnoses   Final diagnoses:  Vaginal yeast infection  Hyperglycemia     Discharge Instructions     Diflucan as prescribed for yeast infection. Your sugar is elevated, importantly follow-up with primary care regarding diabetes management. Go to ER if you develop severe abdominal pain, nausea, vomiting, chest pain, shortness of breath, lightheadedness/dizziness.    ED Prescriptions    Medication Sig Dispense Auth. Provider   fluconazole (DIFLUCAN) 200 MG tablet Take 1 tablet (200 mg total) by mouth every other day for 3 doses. 3 tablet Hall-Potvin, [place], PA-C     PDMP not reviewed this encounter.   Hall-Potvin, [place], [place] 11/22/18 1818

## 2018-11-22 NOTE — Discharge Instructions (Addendum)
Diflucan as prescribed for yeast infection. Your sugar is elevated, importantly follow-up with primary care regarding diabetes management. Go to ER if you develop severe abdominal pain, nausea, vomiting, chest pain, shortness of breath, lightheadedness/dizziness.

## 2018-12-10 ENCOUNTER — Ambulatory Visit
Admission: EM | Admit: 2018-12-10 | Discharge: 2018-12-10 | Disposition: A | Discharge status: Home / Self Care | Payer: Medicaid Other | Attending: Emergency Medicine | Admitting: Emergency Medicine

## 2018-12-10 DIAGNOSIS — N3001 Acute cystitis with hematuria: Secondary | ICD-10-CM | POA: Insufficient documentation

## 2018-12-10 DIAGNOSIS — R739 Hyperglycemia, unspecified: Secondary | ICD-10-CM | POA: Diagnosis not present

## 2018-12-10 DIAGNOSIS — B9689 Other specified bacterial agents as the cause of diseases classified elsewhere: Secondary | ICD-10-CM | POA: Diagnosis not present

## 2018-12-10 DIAGNOSIS — E1169 Type 2 diabetes mellitus with other specified complication: Secondary | ICD-10-CM

## 2018-12-10 LAB — POCT URINALYSIS DIP (MANUAL ENTRY)
Bilirubin, UA: NEGATIVE
Glucose, UA: 250 mg/dL — AB
Ketones, POC UA: NEGATIVE mg/dL
Nitrite, UA: POSITIVE — AB
Protein Ur, POC: >=300 mg/dL — AB
Spec Grav, UA: >=1.03 — AB (ref 1.010–1.025)
Urobilinogen, UA: 0.2 E.U./dL
pH, UA: 6 (ref 5.0–8.0)

## 2018-12-10 LAB — POCT FASTING CBG KUC MANUAL ENTRY: POCT Glucose (KUC): 241 mg/dL — AB (ref 70–99)

## 2018-12-10 LAB — POCT URINE PREGNANCY: Preg Test, Ur: NEGATIVE

## 2018-12-10 MED ORDER — CEPHALEXIN 500 MG PO CAPS: 500.0000 mg | ORAL_CAPSULE | Freq: Two times a day (BID) | ORAL | 0 refills | Status: AC

## 2018-12-10 MED ORDER — FLUCONAZOLE 200 MG PO TABS: 200.0000 mg | ORAL_TABLET | Freq: Once | ORAL | 0 refills | Status: AC

## 2018-12-10 NOTE — ED Provider Notes (Signed)
[name]    CSN: [id number] Arrival date & time: 12/10/18  1020      History   Chief Complaint Chief Complaint  Patient presents with  . Urinary Tract Infection    HPI Kayla Flynn is a 28 y.o. female with history of morbid obesity, hypertension, prediabetes presenting for 3-day course of burning with urination, urinary frequency.  Patient denies vaginal discharge, vaginal pruritus, though does endorse history of vaginal yeast infections status post antibiotic use.  Has not take anything for symptoms.  Of note, since last UC visit with this provider patient was able to schedule PCP appointment for further evaluation/management of diabetes.   Past Medical History:  Diagnosis Date  . Acute appendicitis 07/18/2012  . GDM (gestational diabetes mellitus)   . Hypertension   . Obesity   . Obesity, morbid, BMI 50 or higher ([place]) 07/18/2012  . Pre-eclampsia   . Prediabetes    "prediabetic; controlled w/diet" (07/17/2012)  . Severe preeclampsia 12/10/2014    Patient Active Problem List   Diagnosis Date Noted  . Benign essential HTN 01/28/2015  . Hypertension in pregnancy 12/15/2014  . History of syphilis 08/11/2014  . Maternal varicella, non-immune 07/21/2014  . 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 S/P cesarean section 07/14/2014  . Obesity, morbid, BMI 82 ([place]) 07/18/2012  . DM type 2 (diabetes mellitus, type 2) ([place]) 10/12/2010    Past Surgical History:  Procedure Laterality Date  . APPENDECTOMY  07/17/2012  . CESAREAN SECTION N/A 12/18/2014   Procedure: CESAREAN SECTION;  Surgeon: Osborne Oman, MD;  Location: [facility];  Service: Obstetrics;  Laterality: N/A;  . LAPAROSCOPIC APPENDECTOMY N/A 07/17/2012   Procedure: APPENDECTOMY LAPAROSCOPIC;  Surgeon: Zenovia Jarred, MD;  Location: [facility];  Service: General;  Laterality: N/A;    OB History    Gravida  1   Para  1   Term  1   Preterm      AB      Living   1     SAB      TAB      Ectopic      Multiple  0   Live Births  1            Home Medications    Prior to Admission medications   Medication Sig Start Date End Date Taking? Authorizing Provider  acetaminophen (TYLENOL) 500 MG tablet Take 1,000 mg by mouth every 6 (six) hours as needed for mild pain, moderate pain or headache.     [provider]  albuterol (PROVENTIL HFA;VENTOLIN HFA) 108 (90 Base) MCG/ACT inhaler Inhale 1-2 puffs into the lungs every 6 (six) hours as needed for wheezing or shortness of breath. 04/29/18   Lamptey, Myrene Galas, MD  cephALEXin (KEFLEX) 500 MG capsule Take 1 capsule (500 mg total) by mouth 2 (two) times daily for 5 days. 12/10/18 12/15/18  Hall-Potvin, [place], PA-C  fluconazole (DIFLUCAN) 200 MG tablet Take 1 tablet (200 mg total) by mouth once for 1 dose. May repeat in 72 hours if needed 12/10/18 12/10/18  Hall-Potvin, [place], PA-C  ibuprofen (ADVIL,MOTRIN) 800 MG tablet Take 1 tablet (800 mg total) by mouth 3 (three) times daily. 03/04/18   Fransico Meadow, PA-C    Family History Family History  Problem Relation Age of Onset  . Hypertension Mother   . Diabetes Mother   . Hypertension Maternal Grandmother     Social History Social History   Tobacco Use  .  Smoking status: Current Every Day Smoker    Packs/day: 0.25    Years: 1.20    Pack years: 0.30    Types: Cigarettes  . Smokeless tobacco: Former NeurosurgeonUser    Quit date: 12/10/2014  Substance Use Topics  . Alcohol use: No    Comment: rarely  . Drug use: No     Allergies   Patient has no known allergies.   Review of Systems Review of Systems  Constitutional: Negative for fatigue and fever.  Respiratory: Negative for cough and shortness of breath.   Cardiovascular: Negative for chest pain and palpitations.  Gastrointestinal: Negative for abdominal pain, constipation, diarrhea, nausea and vomiting.  Endocrine: Negative for polydipsia, polyphagia and polyuria.   Genitourinary: Positive for dysuria, frequency and urgency. Negative for flank pain, hematuria, pelvic pain, vaginal bleeding, vaginal discharge and vaginal pain.     Physical Exam Triage Vital Signs ED Triage Vitals  Enc Vitals Group     BP      Pulse      Resp      Temp      Temp src      SpO2      Weight      Height      Head Circumference      Peak Flow      Pain Score      Pain Loc      Pain Edu?      Excl. in GC?    No data found.  Updated Vital Signs BP 133/90 (BP Location: Left Arm)   Pulse 81   Temp 99 F (37.2 C) (Oral)   Resp 18   SpO2 96%    Physical Exam Constitutional:      General: She is not in acute distress.    Appearance: She is obese. She is not ill-appearing.  HENT:     Head: Normocephalic and atraumatic.  Eyes:     General: No scleral icterus.    Pupils: Pupils are equal, round, and reactive to light.  Cardiovascular:     Rate and Rhythm: Normal rate.  Pulmonary:     Effort: Pulmonary effort is normal.  Abdominal:     General: Bowel sounds are normal.     Palpations: Abdomen is soft.     Tenderness: There is no abdominal tenderness. There is no right CVA tenderness, left CVA tenderness or guarding.  Skin:    Coloration: Skin is not jaundiced or pale.  Neurological:     Mental Status: She is alert and oriented to person, place, and time.      UC Treatments / Results  Labs (all labs ordered are listed, but only abnormal results are displayed) Labs Reviewed  POCT URINALYSIS DIP (MANUAL ENTRY) - Abnormal; Notable for the following components:      Result Value   Clarity, UA cloudy (*)    Glucose, UA =250 (*)    Spec Grav, UA >=1.030 (*)    Blood, UA moderate (*)    Protein Ur, POC >=300 (*)    Nitrite, UA Positive (*)    Leukocytes, UA Trace (*)    All other components within normal limits  POCT FASTING CBG [facility] MANUAL ENTRY - Abnormal; Notable for the following components:   POCT Glucose ([facility]) 241 (*)    All other  components within normal limits  POCT URINE PREGNANCY - Normal  URINE CULTURE    EKG   Radiology No results found.  Procedures Procedures (including critical care  time)  Medications Ordered in UC Medications - No data to display  Initial Impression / Assessment and Plan / UC Course  I have reviewed the triage vital signs and the nursing notes.  Pertinent labs & imaging results that were available during my care of the patient were reviewed by me and considered in my medical decision making (see chart for details).     1.  Acute cystitis with hematuria POCT urine dipstick done in office, reviewed by me: Positive for glucose, specific gravity 1.030, protein, nitrates, leukocytes.  Culture pending.  Will initiate Keflex today, provide Diflucan as she gets yeast infections status post antibiotic use.  2.  Type 2 diabetes Patient requesting CBG as she is fasting at today's visit: 241.  Patient seems motivated, working on diet/lifestyle modifications: Continue lifestyle modifications, keep primary care appointment on 12/2.  Return precautions discussed, patient verbalized understanding and is agreeable to plan. Final Clinical Impressions(s) / UC Diagnoses   Final diagnoses:  Acute cystitis with hematuria  Type 2 diabetes mellitus with other specified complication, without long-term current use of insulin (HCC)     Discharge Instructions     Take antibiotic directed with breakfast, dinner. May take Diflucan at end of antibiotic course if you develop vaginal yeast infection. Return for worsening symptoms, abdominal pain, back pain, fever.    ED Prescriptions    Medication Sig Dispense Auth. Provider   cephALEXin (KEFLEX) 500 MG capsule Take 1 capsule (500 mg total) by mouth 2 (two) times daily for 5 days. 10 capsule Hall-Potvin, [place], PA-C   fluconazole (DIFLUCAN) 200 MG tablet Take 1 tablet (200 mg total) by mouth once for 1 dose. May repeat in 72 hours if needed 2 tablet  Hall-Potvin, [place], PA-C     PDMP not reviewed this encounter.   Hall-Potvin, [place], [place] 12/10/18 2003

## 2018-12-10 NOTE — ED Triage Notes (Signed)
Pt c/o burning on urination, urinary frequency and lower abdominal burning x3 days

## 2018-12-10 NOTE — Discharge Instructions (Addendum)
Take antibiotic directed with breakfast, dinner. May take Diflucan at end of antibiotic course if you develop vaginal yeast infection. Return for worsening symptoms, abdominal pain, back pain, fever.

## 2018-12-12 LAB — URINE CULTURE: Culture: >=100000 — AB

## 2018-12-13 ENCOUNTER — Telehealth (HOSPITAL_COMMUNITY): Payer: Self-pay | Admitting: Emergency Medicine

## 2018-12-13 NOTE — Telephone Encounter (Signed)
Urine culture was positive for e coli and was given keflex as treatment  at urgent care visit. Attempted to reach patient. No answer at this time. Mychart comment sent.

## 2019-02-06 ENCOUNTER — Other Ambulatory Visit: Payer: Self-pay

## 2019-02-06 ENCOUNTER — Ambulatory Visit: Payer: Medicaid Other

## 2019-02-06 ENCOUNTER — Other Ambulatory Visit: Payer: Medicaid Other

## 2019-02-06 DIAGNOSIS — Z13220 Encounter for screening for lipoid disorders: Secondary | ICD-10-CM

## 2019-02-06 DIAGNOSIS — E119 Type 2 diabetes mellitus without complications: Secondary | ICD-10-CM

## 2019-02-06 DIAGNOSIS — I10 Essential (primary) hypertension: Secondary | ICD-10-CM

## 2019-02-06 NOTE — Progress Notes (Unsigned)
Patient here for previsit labs. 

## 2019-02-06 NOTE — Progress Notes (Signed)
Patient here for previsit labs. 

## 2019-02-07 LAB — COMPREHENSIVE METABOLIC PANEL
ALT: 19 IU/L (ref 0–32)
AST: 19 IU/L (ref 0–40)
Albumin/Globulin Ratio: 1.5 (ref 1.2–2.2)
Albumin: 4.1 g/dL (ref 3.9–5.0)
Alkaline Phosphatase: 84 IU/L (ref 39–117)
BUN/Creatinine Ratio: 9 (ref 9–23)
BUN: 7 mg/dL (ref 6–20)
Bilirubin Total: <0.2 mg/dL (ref 0.0–1.2)
CO2: 22 mmol/L (ref 20–29)
Calcium: 9.6 mg/dL (ref 8.7–10.2)
Chloride: 102 mmol/L (ref 96–106)
Creatinine, Ser: 0.75 mg/dL (ref 0.57–1.00)
GFR calc Af Amer: 126 mL/min/{1.73_m2} (ref >59)
GFR calc non Af Amer: 110 mL/min/{1.73_m2} (ref >59)
Globulin, Total: 2.8 g/dL (ref 1.5–4.5)
Glucose: 234 mg/dL — ABNORMAL HIGH (ref 65–99)
Potassium: 4.3 mmol/L (ref 3.5–5.2)
Sodium: 139 mmol/L (ref 134–144)
Total Protein: 6.9 g/dL (ref 6.0–8.5)

## 2019-02-07 LAB — CBC WITH DIFFERENTIAL/PLATELET
Basophils Absolute: 0 10*3/uL (ref 0.0–0.2)
Basos: 0 %
EOS (ABSOLUTE): 0.3 10*3/uL (ref 0.0–0.4)
Eos: 3 %
Hematocrit: 36.8 % (ref 34.0–46.6)
Hemoglobin: 12.3 g/dL (ref 11.1–15.9)
Immature Grans (Abs): 0.1 10*3/uL (ref 0.0–0.1)
Immature Granulocytes: 1 %
Lymphocytes Absolute: 2.9 10*3/uL (ref 0.7–3.1)
Lymphs: 30 %
MCH: 31.1 pg (ref 26.6–33.0)
MCHC: 33.4 g/dL (ref 31.5–35.7)
MCV: 93 fL (ref 79–97)
Monocytes Absolute: 0.7 10*3/uL (ref 0.1–0.9)
Monocytes: 8 %
Neutrophils Absolute: 5.5 10*3/uL (ref 1.4–7.0)
Neutrophils: 58 %
Platelets: 358 10*3/uL (ref 150–450)
RBC: 3.96 x10E6/uL (ref 3.77–5.28)
RDW: 12.3 % (ref 11.7–15.4)
WBC: 9.4 10*3/uL (ref 3.4–10.8)

## 2019-02-07 LAB — LIPID PANEL
Chol/HDL Ratio: 5.7 ratio — ABNORMAL HIGH (ref 0.0–4.4)
Cholesterol, Total: 189 mg/dL (ref 100–199)
HDL: 33 mg/dL — ABNORMAL LOW (ref >39)
LDL Chol Calc (NIH): 119 mg/dL — ABNORMAL HIGH (ref 0–99)
Triglycerides: 210 mg/dL — ABNORMAL HIGH (ref 0–149)
VLDL Cholesterol Cal: 37 mg/dL (ref 5–40)

## 2019-02-07 LAB — MICROALBUMIN / CREATININE URINE RATIO
Creatinine, Urine: 132.5 mg/dL
Microalb/Creat Ratio: 42 mg/g creat — ABNORMAL HIGH (ref 0–29)
Microalbumin, Urine: 55.7 ug/mL

## 2019-02-07 LAB — HEMOGLOBIN A1C
Est. average glucose Bld gHb Est-mCnc: 220 mg/dL
Hgb A1c MFr Bld: 9.3 % — ABNORMAL HIGH (ref 4.8–5.6)

## 2019-02-12 ENCOUNTER — Other Ambulatory Visit: Payer: Self-pay | Admitting: Nurse Practitioner

## 2019-02-12 DIAGNOSIS — I10 Essential (primary) hypertension: Secondary | ICD-10-CM

## 2019-02-12 DIAGNOSIS — Z6841 Body Mass Index (BMI) 40.0 and over, adult: Secondary | ICD-10-CM

## 2019-02-12 DIAGNOSIS — E782 Mixed hyperlipidemia: Secondary | ICD-10-CM

## 2019-02-12 DIAGNOSIS — E1165 Type 2 diabetes mellitus with hyperglycemia: Secondary | ICD-10-CM

## 2019-02-12 MED ORDER — VICTOZA 18 MG/3ML ~~LOC~~ SOPN: PEN_INJECTOR | SUBCUTANEOUS | 6 refills | Status: DC

## 2019-02-12 MED ORDER — METFORMIN HCL 500 MG PO TABS: ORAL_TABLET | ORAL | 3 refills | Status: DC

## 2019-02-12 MED ORDER — LISINOPRIL 5 MG PO TABS: 5.0000 mg | ORAL_TABLET | Freq: Every day | ORAL | 3 refills | Status: DC

## 2019-02-12 MED ORDER — ACCU-CHEK FASTCLIX LANCETS MISC: 6 refills | Status: DC

## 2019-02-12 MED ORDER — ACCU-CHEK FASTCLIX LANCET KIT: 1.0000 | PACK | Freq: Once | 0 refills | Status: AC

## 2019-02-12 MED ORDER — ACCU-CHEK GUIDE VI STRP: ORAL_STRIP | 12 refills | Status: DC

## 2019-02-12 MED ORDER — ACCU-CHEK GUIDE ME W/DEVICE KIT: 1.0000 | PACK | Freq: Once | 0 refills | Status: AC

## 2019-02-12 MED ORDER — BD PEN NEEDLE MINI U/F 31G X 5 MM MISC: 3 refills | Status: DC

## 2019-02-12 MED ORDER — ACCU-CHEK GUIDE CONTROL VI LIQD: 1.0000 | Freq: Once | 99 refills | Status: DC | PRN

## 2019-02-12 MED ORDER — ATORVASTATIN CALCIUM 20 MG PO TABS: 20.0000 mg | ORAL_TABLET | Freq: Every day | ORAL | 3 refills | Status: DC

## 2019-02-15 ENCOUNTER — Encounter: Payer: Self-pay | Admitting: Emergency Medicine

## 2019-02-15 ENCOUNTER — Other Ambulatory Visit: Payer: Self-pay

## 2019-02-15 ENCOUNTER — Ambulatory Visit
Admission: EM | Admit: 2019-02-15 | Discharge: 2019-02-15 | Disposition: A | Discharge status: Home / Self Care | Payer: Medicaid Other | Attending: Physician Assistant | Admitting: Physician Assistant

## 2019-02-15 DIAGNOSIS — N3090 Cystitis, unspecified without hematuria: Secondary | ICD-10-CM | POA: Diagnosis not present

## 2019-02-15 DIAGNOSIS — B9689 Other specified bacterial agents as the cause of diseases classified elsewhere: Secondary | ICD-10-CM | POA: Diagnosis not present

## 2019-02-15 DIAGNOSIS — I10 Essential (primary) hypertension: Secondary | ICD-10-CM

## 2019-02-15 DIAGNOSIS — M79672 Pain in left foot: Secondary | ICD-10-CM | POA: Diagnosis not present

## 2019-02-15 LAB — POCT URINALYSIS DIP (MANUAL ENTRY)
Bilirubin, UA: NEGATIVE
Glucose, UA: NEGATIVE mg/dL
Ketones, POC UA: NEGATIVE mg/dL
Nitrite, UA: NEGATIVE
Protein Ur, POC: 30 mg/dL — AB
Spec Grav, UA: >=1.03 — AB (ref 1.010–1.025)
Urobilinogen, UA: 0.2 E.U./dL
pH, UA: 6 (ref 5.0–8.0)

## 2019-02-15 LAB — POCT URINE PREGNANCY: Preg Test, Ur: NEGATIVE

## 2019-02-15 MED ORDER — CEPHALEXIN 500 MG PO CAPS: 500.0000 mg | ORAL_CAPSULE | Freq: Two times a day (BID) | ORAL | 0 refills | Status: DC

## 2019-02-15 MED ORDER — MELOXICAM 7.5 MG PO TABS: 7.5000 mg | ORAL_TABLET | Freq: Every day | ORAL | 0 refills | Status: DC

## 2019-02-15 NOTE — ED Triage Notes (Signed)
Pt presents to [facility] for assessment of 3 days of left foot swelling, and the top has become increasingly sensitive to touch.    Pt also brings up bladder spasms during urination, burning with urination x 2 days.    Pt states she is also in the middle of a 10 day cycle.

## 2019-02-15 NOTE — ED Provider Notes (Signed)
[name]    CSN: [id number] Arrival date & time: 02/15/19  1526      History   Chief Complaint Chief Complaint  Patient presents with  . Foot Swelling    HPI Kayla Garbebony Soots is a 28 y.o. female.   28 year old female comes in for multiple complaints.   1. 3 day history of left foot pain and swelling.  Denies injury/trauma.  Pain is to the left lateral dorsal aspect of the foot, that is constant, worse with movement and weightbearing.  She has also noticed dorsal aspect of the foot swelling with what sounds like pitting edema, this has since resolved.  Denies erythema, warmth.  States has had numbness, tingling associated with the swelling, has resolved since swelling resolved.  Work requires long hours of sitting.  Has not tried anything for the symptoms.  2. Urinary symptoms x 2 days. Dysuria, hesitancy, frequency. Suprapubic pain that is dull, worse with urination. Denies flank pain. Denies nausea, vomiting. Denies fever, chills. Denies vaginal discharge, itching, LMP 02/05/2019. States current cycle has been 10 days, which is longer than normal. However, has not had any vaginal bleeding or spotting today.   Patient diagnosed with diabetes mellitus 1 to 2 years ago.  Last A1c 9.3.  Denies history of diabetic neuropathy.     Past Medical History:  Diagnosis Date  . Acute appendicitis 07/18/2012  . Hypertension   . Obesity   . Obesity, morbid, BMI 50 or higher (HCC) 07/18/2012  . Severe preeclampsia 12/10/2014  . Type 2 diabetes mellitus [facility](HCC)     Patient Active Problem List   Diagnosis Date Noted  . Benign essential HTN 01/28/2015  . History of syphilis 08/11/2014  . Maternal varicella, non-immune 07/21/2014  . 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 S/P cesarean section 07/14/2014  . Obesity, morbid, BMI 82 (HCC) 07/18/2012  . DM type 2 (diabetes mellitus, type 2) (HCC) 10/12/2010    Past Surgical History:    Procedure Laterality Date  . APPENDECTOMY  07/17/2012  . CESAREAN SECTION N/A 12/18/2014   Procedure: CESAREAN SECTION;  Surgeon: Tereso NewcomerUgonna A Anyanwu, MD;  Location: [facility];  Service: Obstetrics;  Laterality: N/A;  . LAPAROSCOPIC APPENDECTOMY N/A 07/17/2012   Procedure: APPENDECTOMY LAPAROSCOPIC;  Surgeon: Liz MaladyBurke E Thompson, MD;  Location: [facility] OR;  Service: General;  Laterality: N/A;    OB History    Gravida  1   Para  1   Term  1   Preterm      AB      Living  1     SAB      TAB      Ectopic      Multiple  0   Live Births  1            Home Medications    Prior to Admission medications   Medication Sig Start Date End Date Taking? Authorizing Provider  Accu-Chek FastClix Lancets MISC Use as instructed. Inject into the skin twice daily  E11.65 Z79.4 02/12/19   Claiborne RiggFleming, Zelda W, NP  atorvastatin (LIPITOR) 20 MG tablet Take 1 tablet (20 mg total) by mouth daily. 02/12/19   Claiborne RiggFleming, Zelda W, NP  Blood Glucose Calibration (ACCU-CHEK GUIDE CONTROL) LIQD 1 each by In Vitro route once as needed for up to 1 dose. E11.65 Z79.4 02/12/19   Claiborne RiggFleming, Zelda W, NP  cephALEXin (KEFLEX) 500 MG capsule Take 1 capsule (500 mg total) by mouth 2 (two)  times daily. 02/15/19   Cassandra Harbold V, PA-C  glucose blood (ACCU-CHEK GUIDE) test strip Use as instructed. Check blood glucose by fingerstick twice per day. E11.65 Z79.4 02/12/19   Claiborne Rigg, NP  Insulin Pen Needle (B-D UF III MINI PEN NEEDLES) 31G X 5 MM MISC Use as instructed. Inject into the skin once daily  E11.65 Z79.4 02/12/19   Claiborne Rigg, NP  liraglutide (VICTOZA) 18 MG/3ML SOPN SubQ:  Inject 0.6 mg once daily into the skin. Week 2: increase to 1.2 mg once daily; week 3: increase to 1.8 mg once daily 02/12/19   Claiborne Rigg, NP  lisinopril (ZESTRIL) 5 MG tablet Take 1 tablet (5 mg total) by mouth daily. 02/12/19   Claiborne Rigg, NP  meloxicam (MOBIC) 7.5 MG tablet Take 1 tablet (7.5 mg total) by mouth daily. 02/15/19   Cathie Hoops, Kydan Shanholtzer V,  PA-C  metFORMIN (GLUCOPHAGE) 500 MG tablet Take one tablet (500mg )  by mouth twice a day for 7 days. Increase to 2 tablets (1000 mg) by mouth twice a day the following week  E11.65 Z79.4 02/12/19   14/8/20, NP    Family History Family History  Problem Relation Age of Onset  . Hypertension Mother   . Diabetes Mother   . Hypertension Maternal Grandmother     Social History Social History   Tobacco Use  . Smoking status: Current Every Day Smoker    Packs/day: 0.25    Years: 1.20    Pack years: 0.30    Types: Cigarettes  . Smokeless tobacco: Former Claiborne Rigg    Quit date: 12/10/2014  Substance Use Topics  . Alcohol use: No    Comment: rarely  . Drug use: No     Allergies   Patient has no known allergies.   Review of Systems Review of Systems  Reason unable to perform ROS: See HPI as above.     Physical Exam Triage Vital Signs ED Triage Vitals  Enc Vitals Group     BP 02/15/19 1538 (!) 163/110     Pulse Rate 02/15/19 1538 75     Resp 02/15/19 1538 18     Temp 02/15/19 1538 97.8 F (36.6 C)     Temp Source 02/15/19 1538 Temporal     SpO2 02/15/19 1538 96 %     Weight --      Height --      Head Circumference --      Peak Flow --      Pain Score 02/15/19 1539 7     Pain Loc --      Pain Edu? --      Excl. in [place]? --    No data found.  Updated Vital Signs BP (!) 163/110 (BP Location: Left Wrist)   Pulse 75   Temp 97.8 F (36.6 C) (Temporal)   Resp 18   LMP 02/05/2019   SpO2 96%   Physical Exam Constitutional:      General: She is not in acute distress.    Appearance: She is well-developed. She is obese. She is not ill-appearing, toxic-appearing or diaphoretic.  HENT:     Head: Normocephalic and atraumatic.  Eyes:     Conjunctiva/sclera: Conjunctivae normal.     Pupils: Pupils are equal, round, and reactive to light.  Cardiovascular:     Rate and Rhythm: Normal rate and regular rhythm.     Heart sounds: Normal heart sounds. No murmur. No  friction rub. No gallop.  Pulmonary:     Effort: Pulmonary effort is normal.     Breath sounds: Normal breath sounds. No wheezing or rales.  Abdominal:     General: Bowel sounds are normal.     Palpations: Abdomen is soft.     Tenderness: There is no abdominal tenderness. There is no right CVA tenderness, left CVA tenderness, guarding or rebound.  Musculoskeletal:     Comments: No obvious swelling, erythema, warmth, contusion seen.  Tenderness to palpation along third and fourth left MTP.  Full range of motion of the ankle and toes.  Extension of ankle causes worsening pain.  Strength normal and equal bilaterally.  Sensation intact ankle bilaterally.  Pedal pulse 2+.  Skin:    General: Skin is warm and dry.  Neurological:     Mental Status: She is alert and oriented to person, place, and time.  Psychiatric:        Behavior: Behavior normal.        Judgment: Judgment normal.      UC Treatments / Results  Labs (all labs ordered are listed, but only abnormal results are displayed) Labs Reviewed  POCT URINALYSIS DIP (MANUAL ENTRY) - Abnormal; Notable for the following components:      Result Value   Clarity, UA cloudy (*)    Spec Grav, UA >=1.030 (*)    Blood, UA small (*)    Protein Ur, POC =30 (*)    Leukocytes, UA Trace (*)    All other components within normal limits  URINE CULTURE  POCT URINE PREGNANCY    EKG   Radiology No results found.  Procedures Procedures (including critical care time)  Medications Ordered in UC Medications - No data to display  Initial Impression / Assessment and Plan / UC Course  I have reviewed the triage vital signs and the nursing notes.  Pertinent labs & imaging results that were available during my care of the patient were reviewed by me and considered in my medical decision making (see chart for details).    1. Cystitis Urine dipstick with small blood, trace leukocytes.  Given significant urinary symptoms, will cover for UTI  with Keflex.  Urine culture sent.  Push fluids.  Return precautions given.  2.  Left foot pain This most likely is due to tendinitis.  Will start NSAIDs, ice compress, elevation.  No current pitting edema/swelling observed today.  However, due to sedentary work, discussed elevation during work, compression stockings if continues to experience swelling.  Return precautions given.  Patient expresses understanding and agrees to plan.  Final Clinical Impressions(s) / UC Diagnoses   Final diagnoses:  Cystitis  Left foot pain   ED Prescriptions    Medication Sig Dispense Auth. Provider   cephALEXin (KEFLEX) 500 MG capsule Take 1 capsule (500 mg total) by mouth 2 (two) times daily. 10 capsule Aleksander Edmiston V, PA-C   meloxicam (MOBIC) 7.5 MG tablet Take 1 tablet (7.5 mg total) by mouth daily. 10 tablet Ok Edwards, PA-C     PDMP not reviewed this encounter.   Ok Edwards, PA-C 02/15/19 1644

## 2019-02-15 NOTE — ED Notes (Signed)
Patient able to ambulate independently  

## 2019-02-15 NOTE — Discharge Instructions (Signed)
Your urine was positive for an urinary tract infection. Start keflex as directed. Keep hydrated, urine should be clear to pale yellow in color. Monitor for any worsening of symptoms, fever, worsening abdominal pain, nausea/vomiting, flank pain, follow up for reevaluation. If continues to have some abdominal pain, vaginal bleeding, follow up with PCP/GYN for further evaluation needed.  Start Mobic. Do not take ibuprofen (motrin/advil)/ naproxen (aleve) while on mobic. Ice compress, elevation, rest. Follow up with PCP if symptoms not improving.

## 2019-02-19 LAB — URINE CULTURE: Culture: <10000 — AB

## 2019-02-24 NOTE — Progress Notes (Signed)
Patient ID: Kayla Flynn, female   DOB: 07-Mar-1991, 28 y.o.   MRN: [id number]  Virtual Visit via Telephone Note  I connected with Teena Irani on 02/26/19 at  8:50 AM EST by telephone and verified that I am speaking with the correct person using two identifiers.   I discussed the limitations, risks, security and privacy concerns of performing an evaluation and management service by telephone and the availability of in person appointments. I also discussed with the patient that there may be a patient responsible charge related to this service. The patient expressed understanding and agreed to proceed.  Patient location:  home My Location:  [facility] office Persons on the call: me and the patient.    History of Present Illness: Blood sugars running from about 98-115.  Doing well since ED visit.  No longer having foot pain/swelling.  No UTI s/sx.    Patient feeling good overall.  Some vaginal itching after recent antibiotics.  Needs different lancets sent.  She has been compliant with meds and working hard on diabetic diet.  Sometimes gets 200 PP.   Seen at ED 02/15/2019.  From A/P:  1. Cystitis Urine dipstick with small blood, trace leukocytes.  Given significant urinary symptoms, will cover for UTI with Keflex.  Urine culture sent.  Push fluids.  Return precautions given.  2.  Left foot pain This most likely is due to tendinitis.  Will start NSAIDs, ice compress, elevation.  No current pitting edema/swelling observed today.  However, due to sedentary work, discussed elevation during work, compression stockings if continues to experience swelling.  Return precautions given.   Observations/Objective:  NAD.     Assessment and Plan: 1. Type 2 diabetes mellitus without complication, unspecified whether long term insulin use ([place]) Much improved.  Continue current regimen and work on diabetic diet.  2. Essential hypertension Continue lisinopril 5  3. Urinary tract infection without hematuria, site  unspecified Resolved.  Diflucan sent if needed  4. Encounter for examination following treatment at hospital Doing well/no sequelae    Follow Up Instructions: 3 months with PCP   I discussed the assessment and treatment plan with the patient. The patient was provided an opportunity to ask questions and all were answered. The patient agreed with the plan and demonstrated an understanding of the instructions.   The patient was advised to call back or seek an in-person evaluation if the symptoms worsen or if the condition fails to improve as anticipated.  I provided 11 minutes of non-face-to-face time during this encounter.   Freeman Caldron, PA-C

## 2019-02-26 ENCOUNTER — Ambulatory Visit (INDEPENDENT_AMBULATORY_CARE_PROVIDER_SITE_OTHER): Payer: Medicaid Other | Admitting: Physician Assistant

## 2019-02-26 DIAGNOSIS — E119 Type 2 diabetes mellitus without complications: Secondary | ICD-10-CM

## 2019-02-26 DIAGNOSIS — N390 Urinary tract infection, site not specified: Secondary | ICD-10-CM | POA: Diagnosis not present

## 2019-02-26 DIAGNOSIS — Z09 Encounter for follow-up examination after completed treatment for conditions other than malignant neoplasm: Secondary | ICD-10-CM | POA: Diagnosis not present

## 2019-02-26 DIAGNOSIS — I10 Essential (primary) hypertension: Secondary | ICD-10-CM | POA: Diagnosis not present

## 2019-02-26 MED ORDER — ACCU-CHEK FASTCLIX LANCETS MISC: 6 refills | Status: DC

## 2019-02-26 MED ORDER — FLUCONAZOLE 150 MG PO TABS: 150.0000 mg | ORAL_TABLET | Freq: Once | ORAL | 0 refills | Status: AC

## 2019-03-05 ENCOUNTER — Ambulatory Visit: Payer: Medicaid Other | Admitting: Registered"

## 2019-03-21 ENCOUNTER — Ambulatory Visit: Payer: Medicaid Other | Admitting: Dietician

## 2019-05-08 ENCOUNTER — Ambulatory Visit
Admission: EM | Admit: 2019-05-08 | Discharge: 2019-05-08 | Disposition: A | Discharge status: Home / Self Care | Payer: Medicaid Other | Attending: Emergency Medicine | Admitting: Emergency Medicine

## 2019-05-08 DIAGNOSIS — N939 Abnormal uterine and vaginal bleeding, unspecified: Secondary | ICD-10-CM

## 2019-05-08 DIAGNOSIS — Z3202 Encounter for pregnancy test, result negative: Secondary | ICD-10-CM | POA: Diagnosis not present

## 2019-05-08 LAB — POCT URINE PREGNANCY: Preg Test, Ur: NEGATIVE

## 2019-05-08 MED ORDER — NAPROXEN 500 MG PO TABS: 500.0000 mg | ORAL_TABLET | Freq: Two times a day (BID) | ORAL | 0 refills | Status: DC

## 2019-05-08 NOTE — ED Provider Notes (Signed)
[name]    CSN: [id number] Arrival date & time: 05/08/19  1615      History   Chief Complaint Chief Complaint  Patient presents with  . Vaginal Bleeding    HPI Kayla Flynn is a 29 y.o. female obesity, hypertension, type 2 diabetes presenting for prolonged menstrual cycle.  States she has been bleeding for the last 21 days: Keeping track on a menstrual cycle logging out.  States that she will have to change her pad frequently: 2 pads per hour.  States she is using ultrathin pads/panty liners.  Patient does have some abdominal cramping to her lower abdomen which feels consistent with period cramps.  Patient states she had a similar episode months ago in which her cycle lasted 11 days.  Denies history of PCOS, ovarian cyst, endometriosis, fibroids.  States that her mother used to have heavy periods, though denies history of fibroids.  Patient denies pelvic pain, vaginal discharge.  Currently sexually active with 1 female partner, not routinely using condoms.  Patient endorsing mild fatigue, episode of lightheadedness yesterday that since resolved.  No nausea, vomiting, chest pain, palpitations, difficulty breathing.  No change in diet, lifestyle, medications.  Tylenol for cramps without significant relief.   Past Medical History:  Diagnosis Date  . Acute appendicitis 07/18/2012  . Hypertension   . Obesity   . Obesity, morbid, BMI 50 or higher (HCC) 07/18/2012  . Severe preeclampsia 12/10/2014  . Type 2 diabetes mellitus [facility])     Patient Active Problem List   Diagnosis Date Noted  . Benign essential HTN 01/28/2015  . History of syphilis 08/11/2014  . Maternal varicella, non-immune 07/21/2014  . 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 S/P cesarean section 07/14/2014  . Obesity, morbid, BMI 82 (HCC) 07/18/2012  . DM type 2 (diabetes mellitus, type 2) (HCC) 10/12/2010    Past Surgical History:  Procedure Laterality Date  .  APPENDECTOMY  07/17/2012  . CESAREAN SECTION N/A 12/18/2014   Procedure: CESAREAN SECTION;  Surgeon: Tereso Newcomer, MD;  Location: [facility];  Service: Obstetrics;  Laterality: N/A;  . LAPAROSCOPIC APPENDECTOMY N/A 07/17/2012   Procedure: APPENDECTOMY LAPAROSCOPIC;  Surgeon: Liz Malady, MD;  Location: [facility] OR;  Service: General;  Laterality: N/A;    OB History    Gravida  1   Para  1   Term  1   Preterm      AB      Living  1     SAB      TAB      Ectopic      Multiple  0   Live Births  1            Home Medications    Prior to Admission medications   Medication Sig Start Date End Date Taking? Authorizing Provider  Accu-Chek FastClix Lancets MISC Use as instructed. Inject into the skin twice daily  E11.65 Z79.4 02/26/19   Anders Simmonds, PA-C  atorvastatin (LIPITOR) 20 MG tablet Take 1 tablet (20 mg total) by mouth daily. 02/12/19   Claiborne Rigg, NP  Blood Glucose Calibration (ACCU-CHEK GUIDE CONTROL) LIQD 1 each by In Vitro route once as needed for up to 1 dose. E11.65 Z79.4 02/12/19   Claiborne Rigg, NP  glucose blood (ACCU-CHEK GUIDE) test strip Use as instructed. Check blood glucose by fingerstick twice per day. E11.65 Z79.4 02/12/19   Claiborne Rigg, NP  Insulin Pen Needle (B-D UF  III MINI PEN NEEDLES) 31G X 5 MM MISC Use as instructed. Inject into the skin once daily  E11.65 Z79.4 02/12/19   Gildardo Pounds, NP  liraglutide (VICTOZA) 18 MG/3ML SOPN SubQ:  Inject 0.6 mg once daily into the skin. Week 2: increase to 1.2 mg once daily; week 3: increase to 1.8 mg once daily 02/12/19   Gildardo Pounds, NP  lisinopril (ZESTRIL) 5 MG tablet Take 1 tablet (5 mg total) by mouth daily. 02/12/19   Gildardo Pounds, NP  metFORMIN (GLUCOPHAGE) 500 MG tablet Take one tablet (500mg )  by mouth twice a day for 7 days. Increase to 2 tablets (1000 mg) by mouth twice a day the following week  E11.65 Z79.4 02/12/19   Gildardo Pounds, NP  naproxen (NAPROSYN) 500 MG tablet  Take 1 tablet (500 mg total) by mouth 2 (two) times daily. 05/08/19   Hall-Potvin, [place], PA-C    Family History Family History  Problem Relation Age of Onset  . Hypertension Mother   . Diabetes Mother   . Hypertension Maternal Grandmother     Social History Social History   Tobacco Use  . Smoking status: Current Every Day Smoker    Packs/day: 0.25    Years: 1.20    Pack years: 0.30    Types: Cigarettes  . Smokeless tobacco: Former Systems developer    Quit date: 12/10/2014  Substance Use Topics  . Alcohol use: No    Comment: rarely  . Drug use: No     Allergies   Patient has no known allergies.   Review of Systems As per HPI   Physical Exam Triage Vital Signs ED Triage Vitals  Enc Vitals Group     BP 05/08/19 1622 (!) 151/84     Pulse Rate 05/08/19 1622 74     Resp 05/08/19 1622 20     Temp 05/08/19 1622 98.9 F (37.2 C)     Temp Source 05/08/19 1622 Oral     SpO2 05/08/19 1622 96 %     Weight --      Height --      Head Circumference --      Peak Flow --      Pain Score 05/08/19 1634 8     Pain Loc --      Pain Edu? --      Excl. in [place]? --    No data found.  Updated Vital Signs BP (!) 151/84 (BP Location: Left Arm)   Pulse 74   Temp 98.9 F (37.2 C) (Oral)   Resp 20   LMP 04/17/2019   SpO2 96%   Visual Acuity Right Eye Distance:   Left Eye Distance:   Bilateral Distance:    Right Eye Near:   Left Eye Near:    Bilateral Near:     Physical Exam Constitutional:      General: She is not in acute distress.    Appearance: She is obese. She is not ill-appearing.  HENT:     Head: Normocephalic and atraumatic.     Mouth/Throat:     Mouth: Mucous membranes are moist.     Pharynx: Oropharynx is clear.  Eyes:     General: No scleral icterus.    Pupils: Pupils are equal, round, and reactive to light.  Cardiovascular:     Rate and Rhythm: Normal rate and regular rhythm.     Heart sounds: No murmur. No gallop.   Pulmonary:     Effort: Pulmonary  effort is normal. No respiratory distress.     Breath sounds: No wheezing.  Abdominal:     General: Bowel sounds are normal.     Tenderness: There is no abdominal tenderness. There is no left CVA tenderness.  Skin:    Capillary Refill: Capillary refill takes less than 2 seconds.     Coloration: Skin is not jaundiced or pale.  Neurological:     Mental Status: She is alert and oriented to person, place, and time.      UC Treatments / Results  Labs (all labs ordered are listed, but only abnormal results are displayed) Labs Reviewed  CBC - Abnormal; Notable for the following components:      Result Value   WBC 11.1 (*)    All other components within normal limits   Narrative:    Performed at:  [address], [place], [place]  [id number] Lab Director: Jolene Schimke MD, Phone:  [phone]  POCT URINE PREGNANCY    EKG   Radiology No results found.  Procedures Procedures (including critical care time)  Medications Ordered in UC Medications - No data to display  Initial Impression / Assessment and Plan / UC Course  I have reviewed the triage vital signs and the nursing notes.  Pertinent labs & imaging results that were available during my care of the patient were reviewed by me and considered in my medical decision making (see chart for details).     Patient afebrile, nontoxic, and hemodynamically stable in office.  Urine pregnancy negative.  H&P consistent with abnormal uterine bleeding: Patient is morbidly obese: We will defer progesterone therapy as I have low concern for acute anemia at this time.  Will draw CBC to confirm.  Patient continue logging symptoms, follow-up with her PCP.  Will change from Tylenol to naproxen for cramping.  Return precautions discussed, patient verbalized understanding and is agreeable to plan. Final Clinical Impressions(s) / UC Diagnoses   Final diagnoses:  Abnormal uterine bleeding     Discharge Instructions      Continue keeping log of periods. Return for worsening bleeding, abdominal pain, lightheadedness or dizziness, chest pain, palpitations, difficulty breathing.    ED Prescriptions    Medication Sig Dispense Auth. Provider   naproxen (NAPROSYN) 500 MG tablet Take 1 tablet (500 mg total) by mouth 2 (two) times daily. 30 tablet Hall-Potvin, [place], PA-C     PDMP not reviewed this encounter.   Hall-Potvin, [place], [place] 05/09/19 1035

## 2019-05-08 NOTE — ED Triage Notes (Signed)
Pt states has been on her menstrual cycle for 21days, using 2 pads an hour. States having mild cramping to lower abdominal for 21 days

## 2019-05-08 NOTE — Discharge Instructions (Addendum)
Continue keeping log of periods. Return for worsening bleeding, abdominal pain, lightheadedness or dizziness, chest pain, palpitations, difficulty breathing.

## 2019-05-09 LAB — CBC
Hematocrit: 35.6 % (ref 34.0–46.6)
Hemoglobin: 12.3 g/dL (ref 11.1–15.9)
MCH: 31.7 pg (ref 26.6–33.0)
MCHC: 34.6 g/dL (ref 31.5–35.7)
MCV: 92 fL (ref 79–97)
Platelets: 326 10*3/uL (ref 150–450)
RBC: 3.88 x10E6/uL (ref 3.77–5.28)
RDW: 12 % (ref 11.7–15.4)
WBC: 11.1 10*3/uL — ABNORMAL HIGH (ref 3.4–10.8)

## 2019-06-04 ENCOUNTER — Other Ambulatory Visit: Payer: Self-pay

## 2019-06-04 ENCOUNTER — Ambulatory Visit
Admission: EM | Admit: 2019-06-04 | Discharge: 2019-06-04 | Disposition: A | Discharge status: Home / Self Care | Payer: Medicaid Other | Attending: Physician Assistant | Admitting: Physician Assistant

## 2019-06-04 DIAGNOSIS — I10 Essential (primary) hypertension: Secondary | ICD-10-CM

## 2019-06-04 DIAGNOSIS — R0981 Nasal congestion: Secondary | ICD-10-CM

## 2019-06-04 DIAGNOSIS — R05 Cough: Secondary | ICD-10-CM

## 2019-06-04 DIAGNOSIS — R509 Fever, unspecified: Secondary | ICD-10-CM

## 2019-06-04 DIAGNOSIS — Z3202 Encounter for pregnancy test, result negative: Secondary | ICD-10-CM

## 2019-06-04 DIAGNOSIS — F17200 Nicotine dependence, unspecified, uncomplicated: Secondary | ICD-10-CM

## 2019-06-04 DIAGNOSIS — Z20822 Contact with and (suspected) exposure to covid-19: Secondary | ICD-10-CM | POA: Diagnosis not present

## 2019-06-04 DIAGNOSIS — R059 Cough, unspecified: Secondary | ICD-10-CM

## 2019-06-04 LAB — POCT URINE PREGNANCY: Preg Test, Ur: NEGATIVE

## 2019-06-04 MED ORDER — ALBUTEROL SULFATE HFA 108 (90 BASE) MCG/ACT IN AERS: 1.0000 | INHALATION_SPRAY | Freq: Four times a day (QID) | RESPIRATORY_TRACT | 0 refills | Status: DC | PRN

## 2019-06-04 MED ORDER — BENZONATATE 200 MG PO CAPS: 200.0000 mg | ORAL_CAPSULE | Freq: Three times a day (TID) | ORAL | 0 refills | Status: DC

## 2019-06-04 MED ORDER — IPRATROPIUM BROMIDE 0.06 % NA SOLN: 2.0000 | Freq: Four times a day (QID) | NASAL | 0 refills | Status: DC

## 2019-06-04 MED ORDER — FLUTICASONE PROPIONATE 50 MCG/ACT NA SUSP: 2.0000 | Freq: Every day | NASAL | 0 refills | Status: DC

## 2019-06-04 NOTE — Discharge Instructions (Addendum)
COVID PCR testing ordered. I would like you to quarantine until testing results. Tessalon for cough. Albuterol as needed for shortness of breath. Start flonase, atrovent nasal spray for nasal congestion/drainage. You can use over the counter nasal saline rinse such as neti pot for nasal congestion. Keep hydrated, your urine should be clear to pale yellow in color. Tylenol/motrin for fever and pain. Monitor for any worsening of symptoms, chest pain, shortness of breath, wheezing, swelling of the throat, go to the emergency department for further evaluation needed.

## 2019-06-04 NOTE — ED Triage Notes (Signed)
Pt c/o cough, dry throat, nasal congestion, and fatigue x 4 days. States has had a fever x 2days. Pt c/o RUQ pain radiating across to lt side since yesterday. Pt states had diarrhea on Sunday and Monday, none today. Pt states also is 16days late from her menstrual cycle.

## 2019-06-04 NOTE — ED Provider Notes (Signed)
[name]    CSN: [id number] Arrival date & time: 06/04/19  1432      History   Chief Complaint Chief Complaint  Patient presents with  . Fever    HPI Kayla Flynn is a 29 y.o. female.   29 year old female comes in for 4 day of URI symptoms. Has had cough, dry cough, nasal congestion, fatigue. States fever for the past 2 days, tmax 104.5, responsive to antipyretic. Has not had antipyretic in the last 8 hours. Sharp RUQ pain that is radiating to the LUQ. Nausea without vomiting.  Diarrhea that is improving. Felt that loss of smell is due to nasal congestion. Has had intermittent shortness of breath where she feels as if she was exerting. Denies shortness of breath, loss of taste. Current every day smoker. Last a1c 9.3     Past Medical History:  Diagnosis Date  . Acute appendicitis 07/18/2012  . Hypertension   . Obesity   . Obesity, morbid, BMI 50 or higher (HCC) 07/18/2012  . Severe preeclampsia 12/10/2014  . Type 2 diabetes mellitus [facility])     Patient Active Problem List   Diagnosis Date Noted  . Benign essential HTN 01/28/2015  . History of syphilis 08/11/2014  . Maternal varicella, non-immune 07/21/2014  . 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 S/P cesarean section 07/14/2014  . Obesity, morbid, BMI 82 (HCC) 07/18/2012  . DM type 2 (diabetes mellitus, type 2) (HCC) 10/12/2010    Past Surgical History:  Procedure Laterality Date  . APPENDECTOMY  07/17/2012  . CESAREAN SECTION N/A 12/18/2014   Procedure: CESAREAN SECTION;  Surgeon: Tereso Newcomer, MD;  Location: [facility];  Service: Obstetrics;  Laterality: N/A;  . LAPAROSCOPIC APPENDECTOMY N/A 07/17/2012   Procedure: APPENDECTOMY LAPAROSCOPIC;  Surgeon: Liz Malady, MD;  Location: [facility];  Service: General;  Laterality: N/A;    OB History    Gravida  1   Para  1   Term  1   Preterm      AB      Living  1     SAB      TAB      Ectopic      Multiple  0   Live Births  1            Home Medications    Prior to Admission medications   Medication Sig Start Date End Date Taking? Authorizing Provider  Accu-Chek FastClix Lancets MISC Use as instructed. Inject into the skin twice daily  E11.65 Z79.4 02/26/19   Anders Simmonds, PA-C  albuterol (VENTOLIN HFA) 108 (90 Base) MCG/ACT inhaler Inhale 1-2 puffs into the lungs every 6 (six) hours as needed for wheezing or shortness of breath. 06/04/19   Cathie Hoops, Amy V, PA-C  atorvastatin (LIPITOR) 20 MG tablet Take 1 tablet (20 mg total) by mouth daily. 02/12/19   Claiborne Rigg, NP  benzonatate (TESSALON) 200 MG capsule Take 1 capsule (200 mg total) by mouth every 8 (eight) hours. 06/04/19   Cathie Hoops, Amy V, PA-C  Blood Glucose Calibration (ACCU-CHEK GUIDE CONTROL) LIQD 1 each by In Vitro route once as needed for up to 1 dose. E11.65 Z79.4 02/12/19   Claiborne Rigg, NP  fluticasone (FLONASE) 50 MCG/ACT nasal spray Place 2 sprays into both nostrils daily. 06/04/19   Yu, Amy V, PA-C  glucose blood (ACCU-CHEK GUIDE) test strip Use as instructed. Check blood glucose by fingerstick twice per day. E11.65  Z79.4 02/12/19   Claiborne Rigg, NP  Insulin Pen Needle (B-D UF III MINI PEN NEEDLES) 31G X 5 MM MISC Use as instructed. Inject into the skin once daily  E11.65 Z79.4 02/12/19   Claiborne Rigg, NP  ipratropium (ATROVENT) 0.06 % nasal spray Place 2 sprays into both nostrils 4 (four) times daily. 06/04/19   Cathie Hoops, Amy V, PA-C  liraglutide (VICTOZA) 18 MG/3ML SOPN SubQ:  Inject 0.6 mg once daily into the skin. Week 2: increase to 1.2 mg once daily; week 3: increase to 1.8 mg once daily 02/12/19   Claiborne Rigg, NP  lisinopril (ZESTRIL) 5 MG tablet Take 1 tablet (5 mg total) by mouth daily. 02/12/19   Claiborne Rigg, NP  metFORMIN (GLUCOPHAGE) 500 MG tablet Take one tablet (500mg )  by mouth twice a day for 7 days. Increase to 2 tablets (1000 mg) by mouth twice a day the following week  E11.65 Z79.4 02/12/19    14/8/20, NP    Family History Family History  Problem Relation Age of Onset  . Hypertension Mother   . Diabetes Mother   . Hypertension Maternal Grandmother     Social History Social History   Tobacco Use  . Smoking status: Current Every Day Smoker    Packs/day: 0.25    Years: 1.20    Pack years: 0.30    Types: Cigarettes  . Smokeless tobacco: Former Claiborne Rigg    Quit date: 12/10/2014  Substance Use Topics  . Alcohol use: No    Comment: rarely  . Drug use: No     Allergies   Patient has no known allergies.   Review of Systems Review of Systems  Reason unable to perform ROS: See HPI as above.     Physical Exam Triage Vital Signs ED Triage Vitals [06/04/19 1446]  Enc Vitals Group     BP (!) 166/95     Pulse Rate 91     Resp 18     Temp 99.6 F (37.6 C)     Temp Source Oral     SpO2 93 %     Weight      Height      Head Circumference      Peak Flow      Pain Score 6     Pain Loc      Pain Edu?      Excl. in GC?    No data found.  Updated Vital Signs BP (!) 166/95 (BP Location: Left Arm)   Pulse 91   Temp 99.6 F (37.6 C) (Oral)   Resp 18   SpO2 93%   Visual Acuity Right Eye Distance:   Left Eye Distance:   Bilateral Distance:    Right Eye Near:   Left Eye Near:    Bilateral Near:     Physical Exam Constitutional:      General: She is not in acute distress.    Appearance: Normal appearance. She is not ill-appearing, toxic-appearing or diaphoretic.  HENT:     Head: Normocephalic and atraumatic.     Mouth/Throat:     Mouth: Mucous membranes are moist.     Pharynx: Oropharynx is clear. Uvula midline.  Cardiovascular:     Rate and Rhythm: Normal rate and regular rhythm.     Heart sounds: Normal heart sounds. No murmur. No friction rub. No gallop.   Pulmonary:     Effort: Pulmonary effort is normal. No accessory muscle usage, prolonged expiration, respiratory  distress or retractions.     Comments: Lungs clear to auscultation  without adventitious lung sounds.  O2 sat during exam 96% Abdominal:     General: Bowel sounds are increased.     Palpations: Abdomen is soft.     Tenderness: There is abdominal tenderness in the right upper quadrant and left upper quadrant. There is no guarding or rebound.  Musculoskeletal:     Cervical back: Normal range of motion and neck supple.  Neurological:     General: No focal deficit present.     Mental Status: She is alert and oriented to person, place, and time.      UC Treatments / Results  Labs (all labs ordered are listed, but only abnormal results are displayed) Labs Reviewed  POCT URINE PREGNANCY - Normal  NOVEL CORONAVIRUS, NAA    EKG   Radiology No results found.  Procedures Procedures (including critical care time)  Medications Ordered in UC Medications - No data to display  Initial Impression / Assessment and Plan / UC Course  I have reviewed the triage vital signs and the nursing notes.  Pertinent labs & imaging results that were available during my care of the patient were reviewed by me and considered in my medical decision making (see chart for details).    COVID PCR test ordered. Patient to quarantine until testing results return. No alarming signs on exam.  Patient speaking in full sentences without respiratory distress.  Symptomatic treatment discussed.  Push fluids.  Return precautions given.  Patient expresses understanding and agrees to plan.  Final Clinical Impressions(s) / UC Diagnoses   Final diagnoses:  Cough  Nasal congestion  Fever, unspecified   ED Prescriptions    Medication Sig Dispense Auth. Provider   benzonatate (TESSALON) 200 MG capsule Take 1 capsule (200 mg total) by mouth every 8 (eight) hours. 21 capsule Yu, Amy V, PA-C   ipratropium (ATROVENT) 0.06 % nasal spray Place 2 sprays into both nostrils 4 (four) times daily. 15 mL Yu, Amy V, PA-C   fluticasone (FLONASE) 50 MCG/ACT nasal spray Place 2 sprays into both  nostrils daily. 1 g Yu, Amy V, PA-C   albuterol (VENTOLIN HFA) 108 (90 Base) MCG/ACT inhaler Inhale 1-2 puffs into the lungs every 6 (six) hours as needed for wheezing or shortness of breath. 18 g Ok Edwards, PA-C     PDMP not reviewed this encounter.   Ok Edwards, PA-C 06/04/19 1523

## 2019-06-05 LAB — NOVEL CORONAVIRUS, NAA: SARS-CoV-2, NAA: NOT DETECTED

## 2019-06-05 LAB — SARS-COV-2, NAA 2 DAY TAT

## 2019-06-17 ENCOUNTER — Ambulatory Visit (HOSPITAL_COMMUNITY)
Admission: EM | Admit: 2019-06-17 | Discharge: 2019-06-17 | Disposition: A | Discharge status: Home / Self Care | Payer: Medicaid Other | Attending: Emergency Medicine | Admitting: Emergency Medicine

## 2019-06-17 ENCOUNTER — Other Ambulatory Visit: Payer: Self-pay

## 2019-06-17 ENCOUNTER — Encounter (HOSPITAL_COMMUNITY): Payer: Self-pay

## 2019-06-17 DIAGNOSIS — Z3202 Encounter for pregnancy test, result negative: Secondary | ICD-10-CM

## 2019-06-17 DIAGNOSIS — R300 Dysuria: Secondary | ICD-10-CM | POA: Insufficient documentation

## 2019-06-17 LAB — POCT URINALYSIS DIP (DEVICE)
Bilirubin Urine: NEGATIVE
Glucose, UA: NEGATIVE mg/dL
Ketones, ur: NEGATIVE mg/dL
Nitrite: NEGATIVE
Protein, ur: 100 mg/dL — AB
Specific Gravity, Urine: >=1.03 (ref 1.005–1.030)
Urobilinogen, UA: 0.2 mg/dL (ref 0.0–1.0)
pH: 6 (ref 5.0–8.0)

## 2019-06-17 LAB — RAPID HIV SCREEN (HIV 1/2 AB+AG)
HIV 1/2 Antibodies: NONREACTIVE
HIV-1 P24 Antigen - HIV24: NONREACTIVE

## 2019-06-17 LAB — POCT PREGNANCY, URINE: Preg Test, Ur: NEGATIVE

## 2019-06-17 LAB — POC URINE PREG, ED: Preg Test, Ur: NEGATIVE

## 2019-06-17 MED ORDER — NITROFURANTOIN MONOHYD MACRO 100 MG PO CAPS: 100.0000 mg | ORAL_CAPSULE | Freq: Two times a day (BID) | ORAL | 0 refills | Status: DC

## 2019-06-17 NOTE — ED Triage Notes (Signed)
Pt presents to UC with pain when urinating, lower abdominal pain and vaginal discomfort x 2 days. Pt denies vaginal discharged.

## 2019-06-17 NOTE — Discharge Instructions (Addendum)
Treating for possible urinary tract infection based on symptoms and urine sample. Sending urine for culture. We also sending a swab for STD testing and checking your blood for testing. Follow up as needed for continued or worsening symptoms

## 2019-06-17 NOTE — ED Provider Notes (Signed)
[facility]    CSN: [id number] Arrival date & time: 06/17/19  1019      History   Chief Complaint Chief Complaint  Patient presents with  . Abdominal Pain  . Dysuria    HPI Kayla Flynn is a 29 y.o. female.   Patient is a 29 year old female past medical history of appendicitis, hypertension, obesity, type 2 diabetes.  She presents today with proximately 2 days of dysuria, lower abdominal cramping and vaginal irritation.  Symptoms have been constant.  Denies any specific vaginal discharge or bleeding. Patient's last menstrual period was 05/08/2019.  Patient reporting irregular menstrual cycles.  No fevers, chills, flank pain, nausea or vomiting.  There is some mild concern for STDs based on partner's previous screening.  ROS per HPI       Past Medical History:  Diagnosis Date  . Acute appendicitis 07/18/2012  . Hypertension   . Obesity   . Obesity, morbid, BMI 50 or higher (HCC) 07/18/2012  . Severe preeclampsia 12/10/2014  . Type 2 diabetes mellitus [facility])     Patient Active Problem List   Diagnosis Date Noted  . Benign essential HTN 01/28/2015  . History of syphilis 08/11/2014  . Maternal varicella, non-immune 07/21/2014  . 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 S/P cesarean section 07/14/2014  . Obesity, morbid, BMI 82 (HCC) 07/18/2012  . DM type 2 (diabetes mellitus, type 2) (HCC) 10/12/2010    Past Surgical History:  Procedure Laterality Date  . APPENDECTOMY  07/17/2012  . CESAREAN SECTION N/A 12/18/2014   Procedure: CESAREAN SECTION;  Surgeon: Tereso Newcomer, MD;  Location: [facility];  Service: Obstetrics;  Laterality: N/A;  . LAPAROSCOPIC APPENDECTOMY N/A 07/17/2012   Procedure: APPENDECTOMY LAPAROSCOPIC;  Surgeon: Liz Malady, MD;  Location: [facility];  Service: General;  Laterality: N/A;    OB History    Gravida  1   Para  1   Term  1   Preterm      AB      Living  1     SAB      TAB     Ectopic      Multiple  0   Live Births  1            Home Medications    Prior to Admission medications   Medication Sig Start Date End Date Taking? Authorizing Provider  Accu-Chek FastClix Lancets MISC Use as instructed. Inject into the skin twice daily  E11.65 Z79.4 02/26/19   Anders Simmonds, PA-C  albuterol (VENTOLIN HFA) 108 (90 Base) MCG/ACT inhaler Inhale 1-2 puffs into the lungs every 6 (six) hours as needed for wheezing or shortness of breath. 06/04/19   Cathie Hoops, Amy V, PA-C  atorvastatin (LIPITOR) 20 MG tablet Take 1 tablet (20 mg total) by mouth daily. 02/12/19   Claiborne Rigg, NP  benzonatate (TESSALON) 200 MG capsule Take 1 capsule (200 mg total) by mouth every 8 (eight) hours. 06/04/19   Cathie Hoops, Amy V, PA-C  Blood Glucose Calibration (ACCU-CHEK GUIDE CONTROL) LIQD 1 each by In Vitro route once as needed for up to 1 dose. E11.65 Z79.4 02/12/19   Claiborne Rigg, NP  fluticasone (FLONASE) 50 MCG/ACT nasal spray Place 2 sprays into both nostrils daily. 06/04/19   Yu, Amy V, PA-C  glucose blood (ACCU-CHEK GUIDE) test strip Use as instructed. Check blood glucose by fingerstick twice per day. E11.65 Z79.4 02/12/19   Claiborne Rigg, NP  Insulin Pen Needle (B-D UF III MINI PEN NEEDLES) 31G X 5 MM MISC Use as instructed. Inject into the skin once daily  E11.65 Z79.4 02/12/19   Claiborne Rigg, NP  ipratropium (ATROVENT) 0.06 % nasal spray Place 2 sprays into both nostrils 4 (four) times daily. 06/04/19   Cathie Hoops, Amy V, PA-C  liraglutide (VICTOZA) 18 MG/3ML SOPN SubQ:  Inject 0.6 mg once daily into the skin. Week 2: increase to 1.2 mg once daily; week 3: increase to 1.8 mg once daily 02/12/19   Claiborne Rigg, NP  lisinopril (ZESTRIL) 5 MG tablet Take 1 tablet (5 mg total) by mouth daily. 02/12/19   Claiborne Rigg, NP  metFORMIN (GLUCOPHAGE) 500 MG tablet Take one tablet (500mg )  by mouth twice a day for 7 days. Increase to 2 tablets (1000 mg) by mouth twice a day the following week  E11.65  Z79.4 02/12/19   14/8/20, NP  nitrofurantoin, macrocrystal-monohydrate, (MACROBID) 100 MG capsule Take 1 capsule (100 mg total) by mouth 2 (two) times daily. 06/17/19   08/17/19, NP    Family History Family History  Problem Relation Age of Onset  . Hypertension Mother   . Diabetes Mother   . Hypertension Maternal Grandmother     Social History Social History   Tobacco Use  . Smoking status: Current Every Day Smoker    Packs/day: 0.25    Years: 1.20    Pack years: 0.30    Types: Cigarettes  . Smokeless tobacco: Former Janace Aris    Quit date: 12/10/2014  Substance Use Topics  . Alcohol use: No    Comment: rarely  . Drug use: No     Allergies   Patient has no known allergies.   Review of Systems Review of Systems   Physical Exam Triage Vital Signs ED Triage Vitals  Enc Vitals Group     BP 06/17/19 1114 (!) 145/102     Pulse Rate 06/17/19 1114 92     Resp 06/17/19 1114 20     Temp 06/17/19 1114 98.5 F (36.9 C)     Temp Source 06/17/19 1114 Oral     SpO2 06/17/19 1114 99 %     Weight --      Height --      Head Circumference --      Peak Flow --      Pain Score 06/17/19 1113 4     Pain Loc --      Pain Edu? --      Excl. in GC? --    No data found.  Updated Vital Signs BP (!) 145/102 (BP Location: Right Wrist)   Pulse 92   Temp 98.5 F (36.9 C) (Oral)   Resp 20   LMP 05/08/2019   SpO2 99%   Visual Acuity Right Eye Distance:   Left Eye Distance:   Bilateral Distance:    Right Eye Near:   Left Eye Near:    Bilateral Near:     Physical Exam Vitals and nursing note reviewed.  Constitutional:      General: She is not in acute distress.    Appearance: Normal appearance. She is not ill-appearing, toxic-appearing or diaphoretic.  HENT:     Head: Normocephalic.     Nose: Nose normal.     Mouth/Throat:     Pharynx: Oropharynx is clear.  Eyes:     Conjunctiva/sclera: Conjunctivae normal.  Pulmonary:     Effort: Pulmonary effort is  normal.  Abdominal:     Palpations: Abdomen is soft.     Tenderness: There is no abdominal tenderness.  Musculoskeletal:        General: Normal range of motion.     Cervical back: Normal range of motion.  Skin:    General: Skin is warm and dry.     Findings: No rash.  Neurological:     Mental Status: She is alert.  Psychiatric:        Mood and Affect: Mood normal.      UC Treatments / Results  Labs (all labs ordered are listed, but only abnormal results are displayed) Labs Reviewed  POCT URINALYSIS DIP (DEVICE) - Abnormal; Notable for the following components:      Result Value   Hgb urine dipstick MODERATE (*)    Protein, ur 100 (*)    Leukocytes,Ua SMALL (*)    All other components within normal limits  URINE CULTURE  RAPID HIV SCREEN (HIV 1/2 AB+AG)  RPR  POCT PREGNANCY, URINE  POC URINE PREG, ED  CERVICOVAGINAL ANCILLARY ONLY    EKG   Radiology No results found.  Procedures Procedures (including critical care time)  Medications Ordered in UC Medications - No data to display  Initial Impression / Assessment and Plan / UC Course  I have reviewed the triage vital signs and the nursing notes.  Pertinent labs & imaging results that were available during my care of the patient were reviewed by me and considered in my medical decision making (see chart for details).     Dysuria-urine with small leuks and moderate hemoglobin.  Concern for UTI at this time.  Other differentials include STD, BV or yeast. Sending urine for culture HIV and RPR drawn.  We will go ahead and treat for UTI pending swab results. Final Clinical Impressions(s) / UC Diagnoses   Final diagnoses:  Dysuria     Discharge Instructions     Treating for possible urinary tract infection based on symptoms and urine sample. Sending urine for culture. We also sending a swab for STD testing and checking your blood for testing. Follow up as needed for continued or worsening  symptoms     ED Prescriptions    Medication Sig Dispense Auth. Provider   nitrofurantoin, macrocrystal-monohydrate, (MACROBID) 100 MG capsule Take 1 capsule (100 mg total) by mouth 2 (two) times daily. 10 capsule Loura Halt A, NP     PDMP not reviewed this encounter.   Orvan July, NP 06/17/19 1238

## 2019-06-18 ENCOUNTER — Telehealth (HOSPITAL_COMMUNITY): Payer: Self-pay

## 2019-06-18 LAB — CERVICOVAGINAL ANCILLARY ONLY
Bacterial Vaginitis (gardnerella): POSITIVE — AB
Candida Glabrata: NEGATIVE
Candida Vaginitis: NEGATIVE
Chlamydia: POSITIVE — AB
Comment: NEGATIVE
Comment: NEGATIVE
Comment: NEGATIVE
Comment: NEGATIVE
Comment: NEGATIVE
Comment: NORMAL
Neisseria Gonorrhea: NEGATIVE
Trichomonas: NEGATIVE

## 2019-06-18 LAB — RPR: RPR Ser Ql: NONREACTIVE

## 2019-06-18 MED ORDER — AZITHROMYCIN 250 MG PO TABS: 1000.0000 mg | ORAL_TABLET | Freq: Once | ORAL | 0 refills | Status: AC

## 2019-06-18 MED ORDER — METRONIDAZOLE 500 MG PO TABS: 500.0000 mg | ORAL_TABLET | Freq: Two times a day (BID) | ORAL | 0 refills | Status: DC

## 2019-06-19 LAB — URINE CULTURE: Culture: 60000 — AB

## 2019-10-09 ENCOUNTER — Ambulatory Visit
Admission: EM | Admit: 2019-10-09 | Discharge: 2019-10-09 | Disposition: A | Discharge status: Home / Self Care | Payer: Medicaid Other | Attending: Emergency Medicine | Admitting: Emergency Medicine

## 2019-10-09 ENCOUNTER — Other Ambulatory Visit: Payer: Self-pay

## 2019-10-09 DIAGNOSIS — N898 Other specified noninflammatory disorders of vagina: Secondary | ICD-10-CM | POA: Insufficient documentation

## 2019-10-09 DIAGNOSIS — B373 Candidiasis of vulva and vagina: Secondary | ICD-10-CM

## 2019-10-09 DIAGNOSIS — B3731 Acute candidiasis of vulva and vagina: Secondary | ICD-10-CM

## 2019-10-09 LAB — POCT URINALYSIS DIP (MANUAL ENTRY)
Bilirubin, UA: NEGATIVE
Blood, UA: NEGATIVE
Glucose, UA: >=1000 mg/dL — AB
Ketones, POC UA: NEGATIVE mg/dL
Leukocytes, UA: NEGATIVE
Nitrite, UA: NEGATIVE
Protein Ur, POC: NEGATIVE mg/dL
Spec Grav, UA: 1.025 (ref 1.010–1.025)
Urobilinogen, UA: 0.2 E.U./dL
pH, UA: 6 (ref 5.0–8.0)

## 2019-10-09 LAB — POCT URINE PREGNANCY: Preg Test, Ur: NEGATIVE

## 2019-10-09 MED ORDER — FLUCONAZOLE 200 MG PO TABS: 200.0000 mg | ORAL_TABLET | Freq: Once | ORAL | 0 refills | Status: AC

## 2019-10-09 NOTE — ED Triage Notes (Signed)
Pt c/o vaginal discomfort, itching, watery discharge, and a sweet smell. States having urinary frequency. States has had un protective intercourse.

## 2019-10-09 NOTE — Discharge Instructions (Addendum)
Take diflucan today, repeat in 3 days if needed. Important to drink plenty of water throughout the day. Important to follow up with PCP for further diabetes management. Return for worsening urinary symptoms, blood in urine, abdominal or back pain, fever.

## 2019-10-09 NOTE — ED Provider Notes (Signed)
[name]    CSN: [id number] Arrival date & time: 10/09/19  1558      History   Chief Complaint Chief Complaint  Patient presents with  . Vaginal Itching    HPI Kayla Flynn is a 29 y.o. female with history of diabetes, obesity, hypertension presenting for vaginal discomfort, itching and sweet malodor to urine.  States is been ongoing for the last 2 days.  Does admit to urinary frequency.  Denies abdominal or pelvic pain, back pain, nausea, vomiting, chest pain, difficulty breathing, fever.  Has not been able to check her sugars recently, though has had refill for testing supplies sent to pharmacy by her PCP with whom she follows up routinely.  Denies polydipsia, polyphagia.    Past Medical History:  Diagnosis Date  . Acute appendicitis 07/18/2012  . Hypertension   . Obesity   . Obesity, morbid, BMI 50 or higher (HCC) 07/18/2012  . Severe preeclampsia 12/10/2014  . Type 2 diabetes mellitus [facility])     Patient Active Problem List   Diagnosis Date Noted  . Benign essential HTN 01/28/2015  . History of syphilis 08/11/2014  . Maternal varicella, non-immune 07/21/2014  . 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 S/P cesarean section 07/14/2014  . Obesity, morbid, BMI 82 (HCC) 07/18/2012  . DM type 2 (diabetes mellitus, type 2) (HCC) 10/12/2010    Past Surgical History:  Procedure Laterality Date  . APPENDECTOMY  07/17/2012  . CESAREAN SECTION N/A 12/18/2014   Procedure: CESAREAN SECTION;  Surgeon: Tereso Newcomer, MD;  Location: [facility];  Service: Obstetrics;  Laterality: N/A;  . LAPAROSCOPIC APPENDECTOMY N/A 07/17/2012   Procedure: APPENDECTOMY LAPAROSCOPIC;  Surgeon: Liz Malady, MD;  Location: [facility] OR;  Service: General;  Laterality: N/A;    OB History    Gravida  1   Para  1   Term  1   Preterm      AB      Living  1     SAB      TAB      Ectopic      Multiple  0   Live Births  1             Home Medications    Prior to Admission medications   Medication Sig Start Date End Date Taking? Authorizing Provider  Accu-Chek FastClix Lancets MISC Use as instructed. Inject into the skin twice daily  E11.65 Z79.4 02/26/19   Anders Simmonds, PA-C  albuterol (VENTOLIN HFA) 108 (90 Base) MCG/ACT inhaler Inhale 1-2 puffs into the lungs every 6 (six) hours as needed for wheezing or shortness of breath. 06/04/19   Cathie Hoops, Amy V, PA-C  atorvastatin (LIPITOR) 20 MG tablet Take 1 tablet (20 mg total) by mouth daily. 02/12/19   Claiborne Rigg, NP  Blood Glucose Calibration (ACCU-CHEK GUIDE CONTROL) LIQD 1 each by In Vitro route once as needed for up to 1 dose. E11.65 Z79.4 02/12/19   Claiborne Rigg, NP  fluconazole (DIFLUCAN) 200 MG tablet Take 1 tablet (200 mg total) by mouth once for 1 dose. May repeat in 72 hours if needed 10/09/19 10/09/19  Hall-Potvin, [place], PA-C  glucose blood (ACCU-CHEK GUIDE) test strip Use as instructed. Check blood glucose by fingerstick twice per day. E11.65 Z79.4 02/12/19   Claiborne Rigg, NP  Insulin Pen Needle (B-D UF III MINI PEN NEEDLES) 31G X 5 MM MISC Use as instructed. Inject into the skin once daily  E11.65 Z79.4 02/12/19   Claiborne Rigg, NP  ipratropium (ATROVENT) 0.06 % nasal spray Place 2 sprays into both nostrils 4 (four) times daily. 06/04/19   Cathie Hoops, Amy V, PA-C  liraglutide (VICTOZA) 18 MG/3ML SOPN SubQ:  Inject 0.6 mg once daily into the skin. Week 2: increase to 1.2 mg once daily; week 3: increase to 1.8 mg once daily 02/12/19   Claiborne Rigg, NP  lisinopril (ZESTRIL) 5 MG tablet Take 1 tablet (5 mg total) by mouth daily. 02/12/19   Claiborne Rigg, NP  metFORMIN (GLUCOPHAGE) 500 MG tablet Take one tablet (500mg )  by mouth twice a day for 7 days. Increase to 2 tablets (1000 mg) by mouth twice a day the following week  E11.65 Z79.4 02/12/19   14/8/20, NP    Family History Family History  Problem Relation Age of Onset  . Hypertension Mother   .  Diabetes Mother   . Hypertension Maternal Grandmother     Social History Social History   Tobacco Use  . Smoking status: Current Every Day Smoker    Packs/day: 0.25    Years: 1.20    Pack years: 0.30    Types: Cigarettes  . Smokeless tobacco: Former Claiborne Rigg    Quit date: 12/10/2014  Substance Use Topics  . Alcohol use: No    Comment: rarely  . Drug use: No     Allergies   Patient has no known allergies.   Review of Systems As per HPI   Physical Exam Triage Vital Signs ED Triage Vitals  Enc Vitals Group     BP 10/09/19 1611 (!) 169/68     Pulse Rate 10/09/19 1611 83     Resp 10/09/19 1611 18     Temp 10/09/19 1611 98.4 F (36.9 C)     Temp Source 10/09/19 1611 Oral     SpO2 10/09/19 1611 95 %     Weight --      Height --      Head Circumference --      Peak Flow --      Pain Score 10/09/19 1621 0     Pain Loc --      Pain Edu? --      Excl. in [place]? --    No data found.  Updated Vital Signs BP (!) 169/68 (BP Location: Left Arm)   Pulse 83   Temp 98.4 F (36.9 C) (Oral)   Resp 18   SpO2 95%   Visual Acuity Right Eye Distance:   Left Eye Distance:   Bilateral Distance:    Right Eye Near:   Left Eye Near:    Bilateral Near:     Physical Exam Constitutional:      General: She is not in acute distress. HENT:     Head: Normocephalic and atraumatic.  Eyes:     General: No scleral icterus.    Pupils: Pupils are equal, round, and reactive to light.  Cardiovascular:     Rate and Rhythm: Normal rate.  Pulmonary:     Effort: Pulmonary effort is normal.  Abdominal:     General: Bowel sounds are normal.     Palpations: Abdomen is soft.     Tenderness: There is no abdominal tenderness. There is no right CVA tenderness, left CVA tenderness or guarding.  Skin:    Coloration: Skin is not jaundiced or pale.  Neurological:     Mental Status: She is alert and oriented to person, place, and  time.      UC Treatments / Results  Labs (all labs ordered  are listed, but only abnormal results are displayed) Labs Reviewed  POCT URINALYSIS DIP (MANUAL ENTRY) - Abnormal; Notable for the following components:      Result Value   Glucose, UA >=1,000 (*)    All other components within normal limits  POCT URINE PREGNANCY - Normal  CERVICOVAGINAL ANCILLARY ONLY    EKG   Radiology No results found.  Procedures Procedures (including critical care time)  Medications Ordered in UC Medications - No data to display  Initial Impression / Assessment and Plan / UC Course  I have reviewed the triage vital signs and the nursing notes.  Pertinent labs & imaging results that were available during my care of the patient were reviewed by me and considered in my medical decision making (see chart for details).     Urine pregnancy negative, urine dipstick significant for glucose.  Likely yeast vaginitis due to poor diabetic control.  Patient reports compliance of medications, will start checking sugars after picking up her diabetic supplies.  Encouraged her to follow-up with PCP thereafter.  ER return precautions discussed, pt verbalized understanding and is agreeable to plan. Final Clinical Impressions(s) / UC Diagnoses   Final diagnoses:  Yeast vaginitis  Vaginal discharge     Discharge Instructions     Take diflucan today, repeat in 3 days if needed. Important to drink plenty of water throughout the day. Important to follow up with PCP for further diabetes management. Return for worsening urinary symptoms, blood in urine, abdominal or back pain, fever.    ED Prescriptions    Medication Sig Dispense Auth. Provider   fluconazole (DIFLUCAN) 200 MG tablet Take 1 tablet (200 mg total) by mouth once for 1 dose. May repeat in 72 hours if needed 2 tablet Hall-Potvin, [place], PA-C     PDMP not reviewed this encounter.   Hall-Potvin, [place], [place] 10/09/19 1746

## 2019-10-11 LAB — CERVICOVAGINAL ANCILLARY ONLY
Chlamydia: NEGATIVE
Comment: NEGATIVE
Comment: NEGATIVE
Comment: NORMAL
Neisseria Gonorrhea: NEGATIVE
Trichomonas: NEGATIVE

## 2019-12-05 ENCOUNTER — Telehealth: Payer: Self-pay

## 2019-12-05 ENCOUNTER — Encounter: Payer: Self-pay | Admitting: Internal Medicine

## 2019-12-05 ENCOUNTER — Ambulatory Visit (INDEPENDENT_AMBULATORY_CARE_PROVIDER_SITE_OTHER): Payer: Medicaid Other | Admitting: Internal Medicine

## 2019-12-05 ENCOUNTER — Other Ambulatory Visit: Payer: Self-pay

## 2019-12-05 VITALS — BP 118/75 | HR 91 | Temp 97.9°F | Resp 17 | Wt 373.0 lb

## 2019-12-05 DIAGNOSIS — Z1159 Encounter for screening for other viral diseases: Secondary | ICD-10-CM

## 2019-12-05 DIAGNOSIS — E785 Hyperlipidemia, unspecified: Secondary | ICD-10-CM | POA: Diagnosis not present

## 2019-12-05 DIAGNOSIS — E119 Type 2 diabetes mellitus without complications: Secondary | ICD-10-CM

## 2019-12-05 DIAGNOSIS — I10 Essential (primary) hypertension: Secondary | ICD-10-CM | POA: Diagnosis not present

## 2019-12-05 LAB — POCT GLYCOSYLATED HEMOGLOBIN (HGB A1C): Hemoglobin A1C: 10.5 % — AB (ref 4.0–5.6)

## 2019-12-05 MED ORDER — ACCU-CHEK GUIDE ME W/DEVICE KIT: PACK | 0 refills | Status: DC

## 2019-12-05 MED ORDER — METFORMIN HCL 1000 MG PO TABS: 1000.0000 mg | ORAL_TABLET | Freq: Two times a day (BID) | ORAL | 1 refills | Status: DC

## 2019-12-05 MED ORDER — VICTOZA 18 MG/3ML ~~LOC~~ SOPN: 0.8000 mg | PEN_INJECTOR | Freq: Every day | SUBCUTANEOUS | 2 refills | Status: DC

## 2019-12-05 MED ORDER — ACCU-CHEK GUIDE VI STRP: ORAL_STRIP | 4 refills | Status: DC

## 2019-12-05 MED ORDER — ACCU-CHEK FASTCLIX LANCETS MISC: 4 refills | Status: DC

## 2019-12-05 MED ORDER — ACCU-CHEK FASTCLIX LANCET KIT: PACK | 0 refills | Status: AC

## 2019-12-05 MED ORDER — VICTOZA 18 MG/3ML ~~LOC~~ SOPN: 1.8000 mg | PEN_INJECTOR | Freq: Every day | SUBCUTANEOUS | 2 refills | Status: DC

## 2019-12-05 NOTE — Telephone Encounter (Signed)
Patient contacted the office about medications sent to the pharamcy. Patient stated one of the medication were missing upon arriving home.

## 2019-12-05 NOTE — Progress Notes (Signed)
Subjective:    Kayla Flynn - 29 y.o. female MRN [id number]  Date of birth: [birth year]  HPI  Kayla Flynn is here for follow up of chronic medical conditions. She just received Medicaid. Reports diet is a big problem for her. She likes to drink soda and really likes sweet foods, particularly pastries. Has never been to nutritionist.   Diabetes mellitus, Type 2 Disease Monitoring             Blood Sugar Ranges: Not monitoring. Does not have supplies.              Polyuria: no              Visual problems: no   Urine Microalbumin 42 (Dec 2020) ---on Lisinopril   Last A1C: 9.3 (Dec 2020)   Medications: Victoza 1.8 mg daily , Metformin 500 mg BID  Medication Compliance: yes  Medication Side Effects             Hypoglycemia: no    Chronic HTN Disease Monitoring:  Home BP Monitoring - Not monitoring  Chest pain- no  Dyspnea- no Headache - no  Medications: Lisinopril 5 mg  Compliance- yes Lightheadedness- no  Edema- no       Health Maintenance:  Health Maintenance Due  Topic Date Due  . Hepatitis C Screening  Never done  . FOOT EXAM  Never done  . OPHTHALMOLOGY EXAM  Never done  . PAP-Cervical Cytology Screening  Never done  . PAP SMEAR-Modifier  Never done  . HEMOGLOBIN A1C  08/07/2019    -  reports that she has been smoking cigarettes. She has a 0.30 pack-year smoking history. She quit smokeless tobacco use about 4 years ago. - Review of Systems: Per HPI. - Past Medical History: Patient Active Problem List   Diagnosis Date Noted  . Benign essential HTN 01/28/2015  . History of syphilis 08/11/2014  . Maternal varicella, non-immune 07/21/2014  . 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 S/P cesarean section 07/14/2014  . Obesity, morbid, BMI 82 ([place]) 07/18/2012  . DM type 2 (diabetes mellitus, type 2) ([place]) 10/12/2010   - Medications: reviewed and updated   Objective:   Physical Exam BP 118/75   Pulse  91   Temp 97.9 F (36.6 C) (Temporal)   Resp 17   Wt (!) 373 lb (169.2 kg)   LMP 11/18/2019 (Exact Date)   SpO2 96%   BMI 78.63 kg/m  Physical Exam Constitutional:      General: She is not in acute distress.    Appearance: She is not diaphoretic.  HENT:     Head: Normocephalic and atraumatic.  Eyes:     Conjunctiva/sclera: Conjunctivae normal.  Cardiovascular:     Rate and Rhythm: Normal rate and regular rhythm.     Heart sounds: Normal heart sounds. No murmur heard.   Pulmonary:     Effort: Pulmonary effort is normal. No respiratory distress.     Breath sounds: Normal breath sounds.  Musculoskeletal:        General: Normal range of motion.  Skin:    General: Skin is warm and dry.  Neurological:     Mental Status: She is alert and oriented to person, place, and time.  Psychiatric:        Mood and Affect: Affect normal.        Judgment: Judgment normal.            Assessment & Plan:   1.  Type 2 diabetes mellitus without complication, without long-term current use of insulin (HCC) A1c 10.5, showing poor glycemic control. Suspect dietary indiscretions are component of this. Discussed diabetic diet and will refer to nutrition. Increase Metformin from 500 mg BID to 1000 mg BID. DM supplies sent in and patient to start monitoring fasting CBGs. Return in 6 weeks.  Counseled on Diabetic diet, my plate method, 110 minutes of moderate intensity exercise/week Blood sugar logs with fasting goals of 80-120 mg/dl, random of less than 180 and in the event of sugars less than 60 mg/dl or greater than 400 mg/dl encouraged to notify the clinic. Advised on the need for annual eye exams, annual foot exams, Pneumonia vaccine. - HgB A1c - HM Diabetes Foot Exam - Comprehensive metabolic panel - LDL Cholesterol, Direct - Blood Glucose Monitoring Suppl (ACCU-CHEK GUIDE ME) w/Device KIT; Use to check FSBS twice a day. Dx: E11.65 Z79.4  Dispense: 1 kit; Refill: 0 - glucose blood (ACCU-CHEK  GUIDE) test strip; Use to check FSBS twice a day. Dx: E11.65 Z79.4  Dispense: 100 each; Refill: 4 - Lancets Misc. (ACCU-CHEK FASTCLIX LANCET) KIT; Use to check FSBS twice a day. Dx: E11.65 Z79.4  Dispense: 1 kit; Refill: 0 - Accu-Chek FastClix Lancets MISC; Use to check FSBS twice a day. Dx: E11.65 Z79.4  Dispense: 100 each; Refill: 4 - metFORMIN (GLUCOPHAGE) 1000 MG tablet; Take 1 tablet (1,000 mg total) by mouth 2 (two) times daily with a meal. Take one tablet (558m)  by mouth twice a day for 7 days. Increase to 2 tablets (1000 mg) by mouth twice a day the following week  E11.65 Z79.4  Dispense: 180 tablet; Refill: 1 - Amb ref to Medical Nutrition Therapy-MNT - liraglutide (VICTOZA) 18 MG/3ML SOPN; Inject 1.8 mg into the skin daily.  Dispense: 15 mL; Refill: 2  2. Essential hypertension BP is at goal. Continue current regimen.  - CBC with Differential - LDL Cholesterol, Direct  3. Need for hepatitis C screening test - HCV Ab w/Rflx to Verification  4. Hyperlipidemia, unspecified hyperlipidemia type Last LDL was 119 in Dec 2020. Repeat to monitor. Was prescribed Atorvastatin 20 mg---not taking. Sexually active and declines contraception. Do not resume statin due to teratogenic effect and age.  - LDL Cholesterol, Direct        CPhill Myron D.O. 12/05/2019, 1:49 PM [facility]

## 2019-12-06 LAB — CBC WITH DIFFERENTIAL/PLATELET
Basophils Absolute: 0 10*3/uL (ref 0.0–0.2)
Basos: 0 %
EOS (ABSOLUTE): 0.3 10*3/uL (ref 0.0–0.4)
Eos: 3 %
Hematocrit: 39.6 % (ref 34.0–46.6)
Hemoglobin: 12.9 g/dL (ref 11.1–15.9)
Immature Grans (Abs): 0.1 10*3/uL (ref 0.0–0.1)
Immature Granulocytes: 1 %
Lymphocytes Absolute: 2.7 10*3/uL (ref 0.7–3.1)
Lymphs: 28 %
MCH: 30.7 pg (ref 26.6–33.0)
MCHC: 32.6 g/dL (ref 31.5–35.7)
MCV: 94 fL (ref 79–97)
Monocytes Absolute: 0.7 10*3/uL (ref 0.1–0.9)
Monocytes: 7 %
Neutrophils Absolute: 5.9 10*3/uL (ref 1.4–7.0)
Neutrophils: 61 %
Platelets: 305 10*3/uL (ref 150–450)
RBC: 4.2 x10E6/uL (ref 3.77–5.28)
RDW: 11.8 % (ref 11.7–15.4)
WBC: 9.5 10*3/uL (ref 3.4–10.8)

## 2019-12-06 LAB — COMPREHENSIVE METABOLIC PANEL
ALT: 20 IU/L (ref 0–32)
AST: 17 IU/L (ref 0–40)
Albumin/Globulin Ratio: 1.6 (ref 1.2–2.2)
Albumin: 4.2 g/dL (ref 3.9–5.0)
Alkaline Phosphatase: 92 IU/L (ref 44–121)
BUN/Creatinine Ratio: 15 (ref 9–23)
BUN: 10 mg/dL (ref 6–20)
Bilirubin Total: <0.2 mg/dL (ref 0.0–1.2)
CO2: 26 mmol/L (ref 20–29)
Calcium: 9.9 mg/dL (ref 8.7–10.2)
Chloride: 100 mmol/L (ref 96–106)
Creatinine, Ser: 0.67 mg/dL (ref 0.57–1.00)
GFR calc Af Amer: 138 mL/min/{1.73_m2} (ref >59)
GFR calc non Af Amer: 120 mL/min/{1.73_m2} (ref >59)
Globulin, Total: 2.6 g/dL (ref 1.5–4.5)
Glucose: 320 mg/dL — ABNORMAL HIGH (ref 65–99)
Potassium: 5 mmol/L (ref 3.5–5.2)
Sodium: 138 mmol/L (ref 134–144)
Total Protein: 6.8 g/dL (ref 6.0–8.5)

## 2019-12-06 LAB — HCV INTERPRETATION

## 2019-12-06 LAB — HCV AB W/RFLX TO VERIFICATION: HCV Ab: 0.1 s/co ratio (ref 0.0–0.9)

## 2019-12-06 LAB — LDL CHOLESTEROL, DIRECT: LDL Direct: 128 mg/dL — ABNORMAL HIGH (ref 0–99)

## 2019-12-09 ENCOUNTER — Other Ambulatory Visit: Payer: Self-pay

## 2019-12-09 ENCOUNTER — Ambulatory Visit (INDEPENDENT_AMBULATORY_CARE_PROVIDER_SITE_OTHER): Payer: Medicaid Other

## 2019-12-09 DIAGNOSIS — Z111 Encounter for screening for respiratory tuberculosis: Secondary | ICD-10-CM | POA: Diagnosis not present

## 2019-12-09 NOTE — Progress Notes (Signed)
Patient here for PPD placement

## 2019-12-10 ENCOUNTER — Institutional Professional Consult (permissible substitution): Payer: Medicaid Other | Admitting: Licensed Clinical Social Worker

## 2019-12-11 ENCOUNTER — Ambulatory Visit (INDEPENDENT_AMBULATORY_CARE_PROVIDER_SITE_OTHER): Payer: Medicaid Other

## 2019-12-11 ENCOUNTER — Other Ambulatory Visit: Payer: Self-pay

## 2019-12-11 DIAGNOSIS — Z111 Encounter for screening for respiratory tuberculosis: Secondary | ICD-10-CM | POA: Diagnosis not present

## 2019-12-11 LAB — TB SKIN TEST
Induration: 0 mm
TB Skin Test: NEGATIVE

## 2019-12-11 NOTE — Progress Notes (Signed)
Patient here for PPD reading. Negative PPD with 0 MM induration. KWalker, CMA.

## 2020-01-01 ENCOUNTER — Ambulatory Visit: Payer: Medicaid Other | Admitting: Dietician

## 2020-01-13 ENCOUNTER — Ambulatory Visit
Admission: EM | Admit: 2020-01-13 | Discharge: 2020-01-13 | Disposition: A | Discharge status: Home / Self Care | Payer: Medicaid Other | Attending: Family Medicine | Admitting: Family Medicine

## 2020-01-13 ENCOUNTER — Encounter: Payer: Self-pay | Admitting: Emergency Medicine

## 2020-01-13 DIAGNOSIS — B3731 Acute candidiasis of vulva and vagina: Secondary | ICD-10-CM

## 2020-01-13 DIAGNOSIS — B373 Candidiasis of vulva and vagina: Secondary | ICD-10-CM | POA: Diagnosis not present

## 2020-01-13 LAB — POCT URINALYSIS DIP (MANUAL ENTRY)
Bilirubin, UA: NEGATIVE
Glucose, UA: 100 mg/dL — AB
Ketones, POC UA: NEGATIVE mg/dL
Leukocytes, UA: NEGATIVE
Nitrite, UA: NEGATIVE
Protein Ur, POC: 100 mg/dL — AB
Spec Grav, UA: >=1.03 — AB
Urobilinogen, UA: 0.2 U/dL
pH, UA: 6

## 2020-01-13 MED ORDER — FLUCONAZOLE 200 MG PO TABS: 200.0000 mg | ORAL_TABLET | Freq: Every day | ORAL | 0 refills | Status: DC

## 2020-01-13 NOTE — ED Triage Notes (Addendum)
Pt c/o vaginal discharge and itching x 3 days. She describes the discharge as looking like cottage cheese. Also endorses dysuria.

## 2020-01-13 NOTE — ED Provider Notes (Signed)
[name]    CSN: [id number] Arrival date & time: 01/13/20  1121      History   Chief Complaint Chief Complaint  Patient presents with  . Vaginal Discharge    HPI Kayla Flynn is a 29 y.o. female.   Patient complains of vaginal itching.  Describes a cheesy like cottage cheese discharge.  No recent antibiotics she is a diabetic however.  She has a history of vaginal yeast infections.  She may also be having some symptoms suggestive of lower urinary tract infection.  HPI  Past Medical History:  Diagnosis Date  . Acute appendicitis 07/18/2012  . Hypertension   . Obesity   . Obesity, morbid, BMI 50 or higher ([place]) 07/18/2012  . Severe preeclampsia 12/10/2014  . Type 2 diabetes mellitus [facility])     Patient Active Problem List   Diagnosis Date Noted  . Hyperlipidemia 12/05/2019  . Benign essential HTN 01/28/2015  . History of syphilis 08/11/2014  . Maternal varicella, non-immune 07/21/2014  . 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 S/P cesarean section 07/14/2014  . Obesity, morbid, BMI 82 ([place]) 07/18/2012  . DM type 2 (diabetes mellitus, type 2) ([place]) 10/12/2010    Past Surgical History:  Procedure Laterality Date  . APPENDECTOMY  07/17/2012  . CESAREAN SECTION N/A 12/18/2014   Procedure: CESAREAN SECTION;  Surgeon: Osborne Oman, MD;  Location: [place] [facility];  Service: Obstetrics;  Laterality: N/A;  . LAPAROSCOPIC APPENDECTOMY N/A 07/17/2012   Procedure: APPENDECTOMY LAPAROSCOPIC;  Surgeon: Zenovia Jarred, MD;  Location: [facility] OR;  Service: General;  Laterality: N/A;    OB History    Gravida  1   Para  1   Term  1   Preterm      AB      Living  1     SAB      TAB      Ectopic      Multiple  0   Live Births  1            Home Medications    Prior to Admission medications   Medication Sig Start Date End Date Taking? Authorizing Provider  Accu-Chek FastClix Lancets MISC Use to check FSBS twice a  day. Dx: E11.65 Z79.4 12/05/19   Nicolette Bang, DO  Blood Glucose Monitoring Suppl (ACCU-CHEK GUIDE ME) w/Device KIT Use to check FSBS twice a day. Dx: E11.65 Z79.4 12/05/19   Nicolette Bang, DO  glucose blood (ACCU-CHEK GUIDE) test strip Use to check FSBS twice a day. Dx: E11.65 Z79.4 12/05/19   Nicolette Bang, DO  Insulin Pen Needle (B-D UF III MINI PEN NEEDLES) 31G X 5 MM MISC Use as instructed. Inject into the skin once daily  E11.65 Z79.4 02/12/19   Gildardo Pounds, NP  Lancets Misc. (ACCU-CHEK FASTCLIX LANCET) KIT Use to check FSBS twice a day. Dx: E11.65 Z79.4 12/05/19   Nicolette Bang, DO  liraglutide (VICTOZA) 18 MG/3ML SOPN Inject 1.8 mg into the skin daily. 12/05/19   Nicolette Bang, DO  lisinopril (ZESTRIL) 5 MG tablet Take 1 tablet (5 mg total) by mouth daily. 02/12/19   Gildardo Pounds, NP  metFORMIN (GLUCOPHAGE) 1000 MG tablet Take 1 tablet (1,000 mg total) by mouth 2 (two) times daily with a meal. Take one tablet (58m)  by mouth twice a day for 7 days. Increase to 2 tablets (1000 mg) by mouth twice a day the following week  E11.65 Z79.4 12/05/19   Nicolette Bang, DO    Family History Family History  Problem Relation Age of Onset  . Hypertension Mother   . Diabetes Mother   . Hypertension Maternal Grandmother     Social History Social History   Tobacco Use  . Smoking status: Current Every Day Smoker    Packs/day: 0.25    Years: 1.20    Pack years: 0.30    Types: Cigarettes  . Smokeless tobacco: Former Systems developer    Quit date: 12/10/2014  Substance Use Topics  . Alcohol use: No    Comment: rarely  . Drug use: No     Allergies   Patient has no known allergies.   Review of Systems Review of Systems  Genitourinary: Positive for dysuria.  All other systems reviewed and are negative.    Physical Exam Triage Vital Signs ED Triage Vitals  Enc Vitals Group     BP 01/13/20 1127 (!) 162/91     Pulse Rate  01/13/20 1127 89     Resp 01/13/20 1127 18     Temp 01/13/20 1127 98.6 F (37 C)     Temp Source 01/13/20 1127 Oral     SpO2 01/13/20 1127 95 %     Weight --      Height --      Head Circumference --      Peak Flow --      Pain Score 01/13/20 1126 3     Pain Loc --      Pain Edu? --      Excl. in [place]? --    No data found.  Updated Vital Signs BP (!) 162/91 (BP Location: Right Arm)   Pulse 89   Temp 98.6 F (37 C) (Oral)   Resp 18   LMP 12/09/2019   SpO2 95%   Visual Acuity Right Eye Distance:   Left Eye Distance:   Bilateral Distance:    Right Eye Near:   Left Eye Near:    Bilateral Near:     Physical Exam Vitals and nursing note reviewed.  Constitutional:      Appearance: Normal appearance. She is obese.  HENT:     Head: Normocephalic.  Cardiovascular:     Rate and Rhythm: Normal rate.  Pulmonary:     Effort: Pulmonary effort is normal.  Genitourinary:    Comments: Patient performed self collection of discharge and specimen will be sent to rule out trichomoniasis yeast. Neurological:     Mental Status: She is alert.  Psychiatric:        Mood and Affect: Mood normal.        Behavior: Behavior normal.      UC Treatments / Results  Labs (all labs ordered are listed, but only abnormal results are displayed) Labs Reviewed - No data to display  EKG   Radiology No results found.  Procedures Procedures (including critical care time)  Medications Ordered in UC Medications - No data to display  Initial Impression / Assessment and Plan / UC Course  I have reviewed the triage vital signs and the nursing notes.  Pertinent labs & imaging results that were available during my care of the patient were reviewed by me and considered in my medical decision making (see chart for details).     Probable yeast vaginitis Final Clinical Impressions(s) / UC Diagnoses   Final diagnoses:  None   Discharge Instructions   None    ED Prescriptions  None       PDMP not reviewed this encounter.   Wardell Honour, MD 01/13/20 1204

## 2020-01-16 ENCOUNTER — Telehealth (INDEPENDENT_AMBULATORY_CARE_PROVIDER_SITE_OTHER): Payer: Medicaid Other | Admitting: Internal Medicine

## 2020-01-16 ENCOUNTER — Other Ambulatory Visit: Payer: Self-pay

## 2020-01-16 DIAGNOSIS — E119 Type 2 diabetes mellitus without complications: Secondary | ICD-10-CM | POA: Diagnosis not present

## 2020-01-16 NOTE — Progress Notes (Signed)
Virtual Visit via Telephone Note  I connected with Kayla Flynn, on 01/16/2020 at 11:13 AM by telephone due to the COVID-19 pandemic and verified that I am speaking with the correct person using two identifiers.   Consent: I discussed the limitations, risks, security and privacy concerns of performing an evaluation and management service by telephone and the availability of in person appointments. I also discussed with the patient that there may be a patient responsible charge related to this service. The patient expressed understanding and agreed to proceed.   Location of Patient: Home   Location of Provider: Clinic    Persons participating in Telemedicine visit: Karista Sindhu Nguyen [facility] Dr. Juleen China      History of Present Illness: Patient has a visit to f/u on T2DM. Reports that she has been working on diet changes like we discussed at last visit. She has really limited processed sugar foods--trying to replace with something more natural like fruit. Still struggles with wanting bread and pasta. She reports she was unable to go to nutrition visit because her child was sick. She would like to have a new referral placed.   Diabetes mellitus, Type 2 Disease Monitoring             Blood Sugar Ranges: Fasting -130s              Polyuria: no             Visual problems: no   Urine Microalbumin 42 (Dec 2020) ---on Lisinopril   Last A1C: 10.5 (Sept 2021)   Medications: Victoza 1.8 mg, Metformin 1000 mg BID (increased at last visit from 500 mg BID)  Medication Compliance: yes  Medication Side Effects             Hypoglycemia: no     Past Medical History:  Diagnosis Date  . Acute appendicitis 07/18/2012  . Hypertension   . Obesity   . Obesity, morbid, BMI 50 or higher ([place]) 07/18/2012  . Severe preeclampsia 12/10/2014  . Type 2 diabetes mellitus (HCC)    No Known Allergies  Current Outpatient Medications on File Prior to Visit  Medication Sig Dispense Refill  . Accu-Chek  FastClix Lancets MISC Use to check FSBS twice a day. Dx: E11.65 Z79.4 100 each 4  . Blood Glucose Monitoring Suppl (ACCU-CHEK GUIDE ME) w/Device KIT Use to check FSBS twice a day. Dx: E11.65 Z79.4 1 kit 0  . fluconazole (DIFLUCAN) 200 MG tablet Take 1 tablet (200 mg total) by mouth daily for 3 days. Take 1 tablet and then repeat 1 tablet after 2 days 2 tablet 0  . glucose blood (ACCU-CHEK GUIDE) test strip Use to check FSBS twice a day. Dx: E11.65 Z79.4 100 each 4  . Insulin Pen Needle (B-D UF III MINI PEN NEEDLES) 31G X 5 MM MISC Use as instructed. Inject into the skin once daily  E11.65 Z79.4 90 each 3  . Lancets Misc. (ACCU-CHEK FASTCLIX LANCET) KIT Use to check FSBS twice a day. Dx: E11.65 Z79.4 1 kit 0  . liraglutide (VICTOZA) 18 MG/3ML SOPN Inject 1.8 mg into the skin daily. 15 mL 2  . lisinopril (ZESTRIL) 5 MG tablet Take 1 tablet (5 mg total) by mouth daily. 90 tablet 3  . metFORMIN (GLUCOPHAGE) 1000 MG tablet Take 1 tablet (1,000 mg total) by mouth 2 (two) times daily with a meal. Take one tablet (548m)  by mouth twice a day for 7 days. Increase to 2 tablets (1000 mg) by mouth twice  a day the following week  E11.65 Z79.4 180 tablet 1   No current facility-administered medications on file prior to visit.    Observations/Objective: NAD. Speaking clearly.  Work of breathing normal.  Alert and oriented. Mood appropriate.   Assessment and Plan: 1. Type 2 diabetes mellitus without complication, without long-term current use of insulin (HCC) A1c at last visit showed poor glycemic control likely related to dietary indiscretions. She has been making changes and will place new referral to nutritionist. At last visit, also increased Metformin dose which she reports compliance with and tolerating well. Fasting CBGs are much more optimally controlled. Will need monitoring A1c in 6 weeks.  Counseled on Diabetic diet, my plate method, 413 minutes of moderate intensity exercise/week Blood sugar logs  with fasting goals of 80-120 mg/dl, random of less than 180 and in the event of sugars less than 60 mg/dl or greater than 400 mg/dl encouraged to notify the clinic. Advised on the need for annual eye exams, annual foot exams, Pneumonia vaccine.  - Amb ref to Medical Nutrition Therapy-MNT    Follow Up Instructions: 6 week f/u DM    I discussed the assessment and treatment plan with the patient. The patient was provided an opportunity to ask questions and all were answered. The patient agreed with the plan and demonstrated an understanding of the instructions.   The patient was advised to call back or seek an in-person evaluation if the symptoms worsen or if the condition fails to improve as anticipated.     I provided 9 minutes total of non-face-to-face time during this encounter including median intraservice time, reviewing previous notes, investigations, ordering medications, medical decision making, coordinating care and patient verbalized understanding at the end of the visit.    Phill Myron, D.O. Primary Care at [facility]  01/16/2020, 11:13 AM

## 2020-01-27 ENCOUNTER — Ambulatory Visit (HOSPITAL_COMMUNITY)
Admission: EM | Admit: 2020-01-27 | Discharge: 2020-01-27 | Disposition: A | Discharge status: Home / Self Care | Payer: Medicaid Other | Attending: Family Medicine | Admitting: Family Medicine

## 2020-01-27 ENCOUNTER — Encounter (HOSPITAL_COMMUNITY): Payer: Self-pay

## 2020-01-27 ENCOUNTER — Other Ambulatory Visit: Payer: Self-pay

## 2020-01-27 DIAGNOSIS — K047 Periapical abscess without sinus: Secondary | ICD-10-CM | POA: Diagnosis not present

## 2020-01-27 MED ORDER — AMOXICILLIN-POT CLAVULANATE 875-125 MG PO TABS: 1.0000 | ORAL_TABLET | Freq: Two times a day (BID) | ORAL | 0 refills | Status: DC

## 2020-01-27 MED ORDER — HYDROCODONE-ACETAMINOPHEN 5-325 MG PO TABS: 1.0000 | ORAL_TABLET | Freq: Four times a day (QID) | ORAL | 0 refills | Status: DC | PRN

## 2020-01-27 NOTE — ED Triage Notes (Signed)
Pt presents with right dental and gum pain since yesterday.

## 2020-01-27 NOTE — Discharge Instructions (Signed)
Take antibiotic 2 times a day Soft foods Take pain medicine as needed Follow-up with dentist

## 2020-01-27 NOTE — ED Provider Notes (Signed)
[place]    CSN: [id number] Arrival date & time: 01/27/20  1625      History   Chief Complaint Chief Complaint  Patient presents with  . Dental Pain    HPI Kayla Flynn is a 29 y.o. female.   HPI  Patient has a fractured tooth.  Its been present for some time.  Recently has become swollen, red, and increasingly painful.  She does not have a dentist. Patient is a diabetic.  She states she is well controlled. She has not had any fever or chills.  Past Medical History:  Diagnosis Date  . Acute appendicitis 07/18/2012  . Hypertension   . Obesity   . Obesity, morbid, BMI 50 or higher ([place]) 07/18/2012  . Severe preeclampsia 12/10/2014  . Type 2 diabetes mellitus [facility])     Patient Active Problem List   Diagnosis Date Noted  . Hyperlipidemia 12/05/2019  . Benign essential HTN 01/28/2015  . History of syphilis 08/11/2014  . Maternal varicella, non-immune 07/21/2014  . 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 S/P cesarean section 07/14/2014  . Obesity, morbid, BMI 82 ([place]) 07/18/2012  . DM type 2 (diabetes mellitus, type 2) ([place]) 10/12/2010    Past Surgical History:  Procedure Laterality Date  . APPENDECTOMY  07/17/2012  . CESAREAN SECTION N/A 12/18/2014   Procedure: CESAREAN SECTION;  Surgeon: Osborne Oman, MD;  Location: [facility];  Service: Obstetrics;  Laterality: N/A;  . LAPAROSCOPIC APPENDECTOMY N/A 07/17/2012   Procedure: APPENDECTOMY LAPAROSCOPIC;  Surgeon: Zenovia Jarred, MD;  Location: [facility];  Service: General;  Laterality: N/A;    OB History    Gravida  1   Para  1   Term  1   Preterm      AB      Living  1     SAB      TAB      Ectopic      Multiple  0   Live Births  1            Home Medications    Prior to Admission medications   Medication Sig Start Date End Date Taking? Authorizing Provider  Accu-Chek FastClix Lancets MISC Use to check FSBS twice a day. Dx: E11.65 Z79.4  12/05/19   Nicolette Bang, DO  amoxicillin-clavulanate (AUGMENTIN) 875-125 MG tablet Take 1 tablet by mouth every 12 (twelve) hours. 01/27/20   Raylene Everts, MD  Blood Glucose Monitoring Suppl (ACCU-CHEK GUIDE ME) w/Device KIT Use to check FSBS twice a day. Dx: E11.65 Z79.4 12/05/19   Nicolette Bang, DO  glucose blood (ACCU-CHEK GUIDE) test strip Use to check FSBS twice a day. Dx: E11.65 Z79.4 12/05/19   Nicolette Bang, DO  HYDROcodone-acetaminophen (NORCO/VICODIN) 5-325 MG tablet Take 1-2 tablets by mouth every 6 (six) hours as needed. 01/27/20   Raylene Everts, MD  Insulin Pen Needle (B-D UF III MINI PEN NEEDLES) 31G X 5 MM MISC Use as instructed. Inject into the skin once daily  E11.65 Z79.4 02/12/19   Gildardo Pounds, NP  Lancets Misc. (ACCU-CHEK FASTCLIX LANCET) KIT Use to check FSBS twice a day. Dx: E11.65 Z79.4 12/05/19   Nicolette Bang, DO  liraglutide (VICTOZA) 18 MG/3ML SOPN Inject 1.8 mg into the skin daily. 12/05/19   Nicolette Bang, DO  lisinopril (ZESTRIL) 5 MG tablet Take 1 tablet (5 mg total) by mouth daily. 02/12/19   Gildardo Pounds, NP  metFORMIN (GLUCOPHAGE) 1000 MG tablet Take 1 tablet (1,000 mg total) by mouth 2 (two) times daily with a meal. Take one tablet (525m)  by mouth twice a day for 7 days. Increase to 2 tablets (1000 mg) by mouth twice a day the following week  E11.65 Z79.4 12/05/19   WNicolette Bang DO    Family History Family History  Problem Relation Age of Onset  . Hypertension Mother   . Diabetes Mother   . Hypertension Maternal Grandmother     Social History Social History   Tobacco Use  . Smoking status: Current Every Day Smoker    Packs/day: 0.25    Years: 1.20    Pack years: 0.30    Types: Cigarettes  . Smokeless tobacco: Former [employer] developer   Quit date: 12/10/2014  Substance Use Topics  . Alcohol use: No    Comment: rarely  . Drug use: No     Allergies   Patient has no known  allergies.   Review of Systems Review of Systems See HPI  Physical Exam Triage Vital Signs ED Triage Vitals  Enc Vitals Group     BP 01/27/20 1756 (!) 172/99     Pulse Rate 01/27/20 1756 77     Resp 01/27/20 1756 20     Temp 01/27/20 1756 98.9 F (37.2 C)     Temp Source 01/27/20 1756 Oral     SpO2 01/27/20 1756 100 %     Weight --      Height --      Head Circumference --      Peak Flow --      Pain Score 01/27/20 1755 8     Pain Loc --      Pain Edu? --      Excl. in [place] --    No data found.  Updated Vital Signs BP (!) 172/99 (BP Location: Right Arm)   Pulse 77   Temp 98.9 F (37.2 C) (Oral)   Resp 20   SpO2 100%      Physical Exam Constitutional:      General: She is not in acute distress.    Appearance: She is well-developed. She is obese.  HENT:     Head: Normocephalic and atraumatic.     Mouth/Throat:     Comments: Right secondary bicuspid has a fracture.  There is surrounding erythema of the gums with some bleeding at the edge of the gums.  Visible and palpable abscess which is very tender Eyes:     Conjunctiva/sclera: Conjunctivae normal.     Pupils: Pupils are equal, round, and reactive to light.  Cardiovascular:     Rate and Rhythm: Normal rate.  Pulmonary:     Effort: Pulmonary effort is normal. No respiratory distress.  Abdominal:     General: There is no distension.     Palpations: Abdomen is soft.  Musculoskeletal:        General: Normal range of motion.     Cervical back: Normal range of motion.  Skin:    General: Skin is warm and dry.  Neurological:     Mental Status: She is alert.  Psychiatric:        Behavior: Behavior normal.      UC Treatments / Results  Labs (all labs ordered are listed, but only abnormal results are displayed) Labs Reviewed - No data to display  EKG   Radiology No results found.  Procedures Procedures (including critical care time)  Medications Ordered  in UC Medications - No data to  display  Initial Impression / Assessment and Plan / UC Course  I have reviewed the triage vital signs and the nursing notes.  Pertinent labs & imaging results that were available during my care of the patient were reviewed by me and considered in my medical decision making (see chart for details).     Final Clinical Impressions(s) / UC Diagnoses   Final diagnoses:  Dental infection     Discharge Instructions     Take antibiotic 2 times a day Soft foods Take pain medicine as needed Follow-up with dentist   ED Prescriptions    Medication Sig Dispense Auth. Provider   amoxicillin-clavulanate (AUGMENTIN) 875-125 MG tablet Take 1 tablet by mouth every 12 (twelve) hours. 14 tablet Raylene Everts, MD   HYDROcodone-acetaminophen (NORCO/VICODIN) 5-325 MG tablet Take 1-2 tablets by mouth every 6 (six) hours as needed. 10 tablet Raylene Everts, MD     I have reviewed the PDMP during this encounter.   Raylene Everts, MD 01/27/20 [phone]

## 2020-02-07 ENCOUNTER — Other Ambulatory Visit: Payer: Self-pay | Admitting: Family Medicine

## 2020-02-07 ENCOUNTER — Ambulatory Visit
Admission: EM | Admit: 2020-02-07 | Discharge: 2020-02-07 | Disposition: A | Discharge status: Home / Self Care | Payer: Medicaid Other | Attending: Family Medicine | Admitting: Family Medicine

## 2020-02-07 DIAGNOSIS — N898 Other specified noninflammatory disorders of vagina: Secondary | ICD-10-CM | POA: Insufficient documentation

## 2020-02-07 DIAGNOSIS — N760 Acute vaginitis: Secondary | ICD-10-CM

## 2020-02-07 LAB — POCT URINALYSIS DIP (MANUAL ENTRY)
Bilirubin, UA: NEGATIVE
Glucose, UA: 250 mg/dL — AB
Ketones, POC UA: NEGATIVE mg/dL
Leukocytes, UA: NEGATIVE
Nitrite, UA: NEGATIVE
Protein Ur, POC: 100 mg/dL — AB
Spec Grav, UA: >=1.03 — AB (ref 1.010–1.025)
Urobilinogen, UA: 0.2 E.U./dL
pH, UA: 6 (ref 5.0–8.0)

## 2020-02-07 LAB — POCT URINE PREGNANCY: Preg Test, Ur: NEGATIVE

## 2020-02-07 MED ORDER — FLUCONAZOLE 150 MG PO TABS: 150.0000 mg | ORAL_TABLET | Freq: Once | ORAL | 0 refills | Status: AC

## 2020-02-07 NOTE — ED Provider Notes (Signed)
[name]    CSN: [id number] Arrival date & time: 02/07/20  0825      History   Chief Complaint No chief complaint on file.   HPI Kayla Flynn is a 29 y.o. female.   HPI Patient presents today with vaginal itching and irritation following the completion of a course of antibiotics which she took for oral abscess infection.  She reports a few days prior to completing the antibiotic she developed itching and irritation around her vulva.  She denies any dysuria symptoms.  She reports she often gets yeast infections when she takes antibiotics.  Patient has type 2 diabetes. Past Medical History:  Diagnosis Date  . Acute appendicitis 07/18/2012  . Hypertension   . Obesity   . Obesity, morbid, BMI 50 or higher ([place]) 07/18/2012  . Severe preeclampsia 12/10/2014  . Type 2 diabetes mellitus [facility])     Patient Active Problem List   Diagnosis Date Noted  . Hyperlipidemia 12/05/2019  . Benign essential HTN 01/28/2015  . History of syphilis 08/11/2014  . Maternal varicella, non-immune 07/21/2014  . 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 S/P cesarean section 07/14/2014  . Obesity, morbid, BMI 82 ([place]) 07/18/2012  . DM type 2 (diabetes mellitus, type 2) ([place]) 10/12/2010    Past Surgical History:  Procedure Laterality Date  . APPENDECTOMY  07/17/2012  . CESAREAN SECTION N/A 12/18/2014   Procedure: CESAREAN SECTION;  Surgeon: Osborne Oman, MD;  Location: [facility];  Service: Obstetrics;  Laterality: N/A;  . LAPAROSCOPIC APPENDECTOMY N/A 07/17/2012   Procedure: APPENDECTOMY LAPAROSCOPIC;  Surgeon: Zenovia Jarred, MD;  Location: [facility];  Service: General;  Laterality: N/A;    OB History    Gravida  1   Para  1   Term  1   Preterm      AB      Living  1     SAB      TAB      Ectopic      Multiple  0   Live Births  1            Home Medications    Prior to Admission medications   Medication Sig Start Date  End Date Taking? Authorizing Provider  Accu-Chek FastClix Lancets MISC Use to check FSBS twice a day. Dx: E11.65 Z79.4 12/05/19   Nicolette Bang, DO  amoxicillin-clavulanate (AUGMENTIN) 875-125 MG tablet Take 1 tablet by mouth every 12 (twelve) hours. 01/27/20   Raylene Everts, MD  Blood Glucose Monitoring Suppl (ACCU-CHEK GUIDE ME) w/Device KIT Use to check FSBS twice a day. Dx: E11.65 Z79.4 12/05/19   Nicolette Bang, DO  glucose blood (ACCU-CHEK GUIDE) test strip Use to check FSBS twice a day. Dx: E11.65 Z79.4 12/05/19   Nicolette Bang, DO  HYDROcodone-acetaminophen (NORCO/VICODIN) 5-325 MG tablet Take 1-2 tablets by mouth every 6 (six) hours as needed. 01/27/20   Raylene Everts, MD  Insulin Pen Needle (B-D UF III MINI PEN NEEDLES) 31G X 5 MM MISC Use as instructed. Inject into the skin once daily  E11.65 Z79.4 02/12/19   Gildardo Pounds, NP  Lancets Misc. (ACCU-CHEK FASTCLIX LANCET) KIT Use to check FSBS twice a day. Dx: E11.65 Z79.4 12/05/19   Nicolette Bang, DO  liraglutide (VICTOZA) 18 MG/3ML SOPN Inject 1.8 mg into the skin daily. 12/05/19   Nicolette Bang, DO  lisinopril (ZESTRIL) 5 MG tablet Take 1 tablet (5 mg total)  by mouth daily. 02/12/19   Gildardo Pounds, NP  metFORMIN (GLUCOPHAGE) 1000 MG tablet Take 1 tablet (1,000 mg total) by mouth 2 (two) times daily with a meal. Take one tablet (522m)  by mouth twice a day for 7 days. Increase to 2 tablets (1000 mg) by mouth twice a day the following week  E11.65 Z79.4 12/05/19   WNicolette Bang DO    Family History Family History  Problem Relation Age of Onset  . Hypertension Mother   . Diabetes Mother   . Hypertension Maternal Grandmother     Social History Social History   Tobacco Use  . Smoking status: Current Every Day Smoker    Packs/day: 0.25    Years: 1.20    Pack years: 0.30    Types: Cigarettes  . Smokeless tobacco: Former [employer] developer   Quit date: 12/10/2014    Substance Use Topics  . Alcohol use: No    Comment: rarely  . Drug use: No     Allergies   Patient has no known allergies.   Review of Systems Review of Systems Pertinent negatives listed in HPI   Physical Exam Triage Vital Signs ED Triage Vitals [02/07/20 0833]  Enc Vitals Group     BP (!) 155/95     Pulse Rate 88     Resp 20     Temp 98 F (36.7 C)     Temp Source Oral     SpO2 94 %     Weight      Height      Head Circumference      Peak Flow      Pain Score      Pain Loc      Pain Edu?      Excl. in [place]    No data found.  Updated Vital Signs BP (!) 155/95 (BP Location: Left Arm)   Pulse 88   Temp 98 F (36.7 C) (Oral)   Resp 20   SpO2 94%   Visual Acuity Right Eye Distance:   Left Eye Distance:   Bilateral Distance:    Right Eye Near:   Left Eye Near:    Bilateral Near:     Physical Exam General appearance: alert, well developed, well nourished, cooperative  Head: Normocephalic, without obvious abnormality, atraumatic Respiratory: Respirations even and unlabored, normal respiratory rate Heart: rate and rhythm normal. No gallop or murmurs noted on exam  Extremities: No gross deformities Skin: Skin color, texture, turgor normal. No rashes seen  Psych: Appropriate mood and affect. Neurologic: Mental status: Alert, oriented to person, place, and time, thought content appropriate. UC Treatments / Results  Labs (all labs ordered are listed, but only abnormal results are displayed) Labs Reviewed  POCT URINALYSIS DIP (MANUAL ENTRY) - Abnormal; Notable for the following components:      Result Value   Glucose, UA =250 (*)    Spec Grav, UA >=1.030 (*)    Blood, UA trace-intact (*)    Protein Ur, POC =100 (*)    All other components within normal limits  PREGNANCY, URINE  CERVICOVAGINAL ANCILLARY ONLY    EKG   Radiology No results found.  Procedures Procedures (including critical care time)  Medications Ordered in UC Medications - No  data to display  Initial Impression / Assessment and Plan / UC Course  I have reviewed the triage vital signs and the nursing notes.  Pertinent labs & imaging results that were available during my care of the  patient were reviewed by me and considered in my medical decision making (see chart for details).     Patient presents today with vaginitis suspect related to recent antibiotic use.  Will cover with Diflucan 150 mg today may repeat dose in 3 days if symptoms worsen or do not improve. Final Clinical Impressions(s) / UC Diagnoses   Final diagnoses:  Vaginal discharge  Vaginitis and vulvovaginitis   Discharge Instructions   None    ED Prescriptions    None     PDMP not reviewed this encounter.   Scot Jun, [place] 02/07/20 [phone]

## 2020-02-07 NOTE — ED Triage Notes (Signed)
Pt c/o thick white vaginal discharge with slight odor x3.

## 2020-02-11 LAB — MOLECULAR ANCILLARY ONLY
Bacterial Vaginitis (gardnerella): POSITIVE — AB
Candida Glabrata: NEGATIVE
Candida Vaginitis: POSITIVE — AB
Chlamydia: NEGATIVE
Comment: NEGATIVE
Comment: NEGATIVE
Comment: NEGATIVE
Comment: NEGATIVE
Comment: NEGATIVE
Comment: NORMAL
Neisseria Gonorrhea: NEGATIVE
Trichomonas: NEGATIVE

## 2020-03-17 ENCOUNTER — Telehealth: Payer: Self-pay

## 2020-03-17 NOTE — Telephone Encounter (Signed)
Patient contacted the office about updated TB records.

## 2020-03-17 NOTE — Telephone Encounter (Signed)
Results available in pt's MyChart, letter also generated and sent to pt via MyChart, LM informing pt of this and advised to call clinic with any further needs/concerns.

## 2020-07-31 ENCOUNTER — Ambulatory Visit
Admission: EM | Admit: 2020-07-31 | Discharge: 2020-07-31 | Disposition: A | Discharge status: Home / Self Care | Payer: Medicaid Other | Attending: Emergency Medicine | Admitting: Emergency Medicine

## 2020-07-31 ENCOUNTER — Other Ambulatory Visit: Payer: Self-pay

## 2020-07-31 DIAGNOSIS — K0889 Other specified disorders of teeth and supporting structures: Secondary | ICD-10-CM | POA: Diagnosis not present

## 2020-07-31 MED ORDER — AMOXICILLIN-POT CLAVULANATE 875-125 MG PO TABS: 1.0000 | ORAL_TABLET | Freq: Two times a day (BID) | ORAL | 0 refills | Status: AC

## 2020-07-31 MED ORDER — IBUPROFEN 800 MG PO TABS: 800.0000 mg | ORAL_TABLET | Freq: Three times a day (TID) | ORAL | 0 refills | Status: DC

## 2020-07-31 MED ORDER — FLUCONAZOLE 150 MG PO TABS: 150.0000 mg | ORAL_TABLET | Freq: Once | ORAL | 0 refills | Status: AC

## 2020-07-31 NOTE — ED Provider Notes (Signed)
[name]    CSN: [id number] Arrival date & time: 07/31/20  0843      History   Chief Complaint Chief Complaint  Patient presents with  . Dental Pain    HPI Kayla Flynn is a 30 y.o. female history of hypertension, DM type II presenting today for evaluation of left facial pain.  Reports over the past 3 days has had pain to her left lower jaw.  Reports her wisdom tooth is broken and was never removed from left lower jaw.  Denies any fevers or difficulty swallowing.  Pain radiating to left ear.  Does report that she has been congested recently.  Of note pressure elevated today, has not taken blood pressure medicine.  HPI  Past Medical History:  Diagnosis Date  . Acute appendicitis 07/18/2012  . Hypertension   . Obesity   . Obesity, morbid, BMI 50 or higher ([place]) 07/18/2012  . Severe preeclampsia 12/10/2014  . Type 2 diabetes mellitus [facility])     Patient Active Problem List   Diagnosis Date Noted  . Hyperlipidemia 12/05/2019  . Benign essential HTN 01/28/2015  . History of syphilis 08/11/2014  . Maternal varicella, non-immune 07/21/2014  . 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 S/P cesarean section 07/14/2014  . Obesity, morbid, BMI 82 ([place]) 07/18/2012  . DM type 2 (diabetes mellitus, type 2) ([place]) 10/12/2010    Past Surgical History:  Procedure Laterality Date  . APPENDECTOMY  07/17/2012  . CESAREAN SECTION N/A 12/18/2014   Procedure: CESAREAN SECTION;  Surgeon: Osborne Oman, MD;  Location: [facility];  Service: Obstetrics;  Laterality: N/A;  . LAPAROSCOPIC APPENDECTOMY N/A 07/17/2012   Procedure: APPENDECTOMY LAPAROSCOPIC;  Surgeon: Zenovia Jarred, MD;  Location: [facility];  Service: General;  Laterality: N/A;    OB History    Gravida  1   Para  1   Term  1   Preterm      AB      Living  1     SAB      IAB      Ectopic      Multiple  0   Live Births  1            Home Medications     Prior to Admission medications   Medication Sig Start Date End Date Taking? Authorizing Provider  amoxicillin-clavulanate (AUGMENTIN) 875-125 MG tablet Take 1 tablet by mouth every 12 (twelve) hours for 7 days. 07/31/20 08/07/20 Yes Ariday Brinker C, PA-C  fluconazole (DIFLUCAN) 150 MG tablet Take 1 tablet (150 mg total) by mouth once for 1 dose. 07/31/20 07/31/20 Yes Emmogene Simson C, PA-C  ibuprofen (ADVIL) 800 MG tablet Take 1 tablet (800 mg total) by mouth 3 (three) times daily. 07/31/20  Yes Stonewall Doss C, PA-C  Accu-Chek [employer] Use to check FSBS twice a day. Dx: E11.65 Z79.4 12/05/19   Nicolette Bang, DO  Blood Glucose Monitoring Suppl (ACCU-CHEK GUIDE ME) w/Device KIT Use to check FSBS twice a day. Dx: E11.65 Z79.4 12/05/19   Nicolette Bang, DO  glucose blood (ACCU-CHEK GUIDE) test strip Use to check FSBS twice a day. Dx: E11.65 Z79.4 12/05/19   Nicolette Bang, DO  Insulin Pen Needle (B-D UF III MINI PEN NEEDLES) 31G X 5 MM MISC Use as instructed. Inject into the skin once daily  E11.65 Z79.4 02/12/19   Gildardo Pounds, NP  Lancets Misc. (ACCU-CHEK FASTCLIX LANCET) KIT Use to  check FSBS twice a day. Dx: E11.65 Z79.4 12/05/19   Nicolette Bang, DO  liraglutide (VICTOZA) 18 MG/3ML SOPN Inject 1.8 mg into the skin daily. 12/05/19   Nicolette Bang, DO  lisinopril (ZESTRIL) 5 MG tablet Take 1 tablet (5 mg total) by mouth daily. 02/12/19   Gildardo Pounds, NP  metFORMIN (GLUCOPHAGE) 1000 MG tablet Take 1 tablet (1,000 mg total) by mouth 2 (two) times daily with a meal. Take one tablet (537m)  by mouth twice a day for 7 days. Increase to 2 tablets (1000 mg) by mouth twice a day the following week  E11.65 Z79.4 12/05/19   WNicolette Bang DO    Family History Family History  Problem Relation Age of Onset  . Hypertension Mother   . Diabetes Mother   . Hypertension Maternal Grandmother     Social History Social History    Tobacco Use  . Smoking status: Current Every Day Smoker    Packs/day: 0.25    Years: 1.20    Pack years: 0.30    Types: Cigarettes  . Smokeless tobacco: Former [employer] developer   Quit date: 12/10/2014  Substance Use Topics  . Alcohol use: No    Comment: rarely  . Drug use: No     Allergies   Patient has no known allergies.   Review of Systems Review of Systems  Constitutional: Negative for activity change, appetite change, chills, fatigue and fever.  HENT: Positive for dental problem and ear pain. Negative for congestion, rhinorrhea, sinus pressure, sore throat and trouble swallowing.   Eyes: Negative for discharge and redness.  Respiratory: Negative for cough, chest tightness and shortness of breath.   Cardiovascular: Negative for chest pain.  Gastrointestinal: Negative for abdominal pain, diarrhea, nausea and vomiting.  Musculoskeletal: Negative for myalgias.  Skin: Negative for rash.  Neurological: Negative for dizziness, light-headedness and headaches.     Physical Exam Triage Vital Signs ED Triage Vitals  Enc Vitals Group     BP      Pulse      Resp      Temp      Temp src      SpO2      Weight      Height      Head Circumference      Peak Flow      Pain Score      Pain Loc      Pain Edu?      [place]    No data found.  Updated Vital Signs BP (!) 174/120   Pulse 85   Temp 98.3 F (36.8 C)   Resp 19   SpO2 96%   Visual Acuity Right Eye Distance:   Left Eye Distance:   Bilateral Distance:    Right Eye Near:   Left Eye Near:    Bilateral Near:     Physical Exam Vitals and nursing note reviewed.  Constitutional:      Appearance: She is well-developed.     Comments: No acute distress  HENT:     Head: Normocephalic and atraumatic.     Comments: No obvious facial swelling    Ears:     Comments: Bilateral TMs with cerumen present largely blocking visualization of TM    Nose: Nose normal.     Mouth/Throat:     Comments: Left lower posterior  molar/tooth appears impacted and fractured, surrounding gingival swelling and tenderness, no soft palate swelling, posterior pharynx patent, uvula midline  Eyes:     Conjunctiva/sclera: Conjunctivae normal.  Neck:     Comments: No neck swelling or erythema, full active range of motion of neck Cardiovascular:     Rate and Rhythm: Normal rate.  Pulmonary:     Effort: Pulmonary effort is normal. No respiratory distress.  Abdominal:     General: There is no distension.  Musculoskeletal:        General: Normal range of motion.     Cervical back: Neck supple.  Skin:    General: Skin is warm and dry.  Neurological:     Mental Status: She is alert and oriented to person, place, and time.      UC Treatments / Results  Labs (all labs ordered are listed, but only abnormal results are displayed) Labs Reviewed - No data to display  EKG   Radiology No results found.  Procedures Procedures (including critical care time)  Medications Ordered in UC Medications - No data to display  Initial Impression / Assessment and Plan / UC Course  I have reviewed the triage vital signs and the nursing notes.  Pertinent labs & imaging results that were available during my care of the patient were reviewed by me and considered in my medical decision making (see chart for details).     Treating for dental infection-Augmentin x1 week, anti-inflammatories for pain and warm compresses.  Continue to monitor.  No sign of deep space infection at this time.  Discussed strict return precautions. Patient verbalized understanding and is agreeable with plan.  Final Clinical Impressions(s) / UC Diagnoses   Final diagnoses:  Pain, dental     Discharge Instructions     Begin Augmentin twice daily for the next week Ibuprofen and Tylenol for pain Alternate ice and heat compresses to jaw Follow-up if not improving or worsening    ED Prescriptions    Medication Sig Dispense Auth. Provider   ibuprofen  (ADVIL) 800 MG tablet Take 1 tablet (800 mg total) by mouth 3 (three) times daily. 21 tablet Kayliee Atienza C, PA-C   amoxicillin-clavulanate (AUGMENTIN) 875-125 MG tablet Take 1 tablet by mouth every 12 (twelve) hours for 7 days. 14 tablet Eirik Schueler C, PA-C   fluconazole (DIFLUCAN) 150 MG tablet Take 1 tablet (150 mg total) by mouth once for 1 dose. 2 tablet Dennise Raabe, [place] C, PA-C     PDMP not reviewed this encounter.   Idriss Quackenbush, [place] C, PA-C 07/31/20 1050

## 2020-07-31 NOTE — Discharge Instructions (Signed)
Begin Augmentin twice daily for the next week Ibuprofen and Tylenol for pain Alternate ice and heat compresses to jaw Follow-up if not improving or worsening

## 2020-07-31 NOTE — ED Triage Notes (Signed)
Pt presents with complaints of left sided tooth pain that is radiating into her left ear. Reports the pain is taking her breath away. Reports pain has been there for 3 days.

## 2020-10-16 ENCOUNTER — Other Ambulatory Visit: Payer: Self-pay

## 2020-10-16 ENCOUNTER — Ambulatory Visit (HOSPITAL_COMMUNITY)
Admission: EM | Admit: 2020-10-16 | Discharge: 2020-10-16 | Disposition: A | Discharge status: Home / Self Care | Payer: Medicaid Other | Attending: Internal Medicine | Admitting: Internal Medicine

## 2020-10-16 ENCOUNTER — Encounter (HOSPITAL_COMMUNITY): Payer: Self-pay | Admitting: Emergency Medicine

## 2020-10-16 DIAGNOSIS — F4321 Adjustment disorder with depressed mood: Secondary | ICD-10-CM | POA: Diagnosis present

## 2020-10-16 DIAGNOSIS — M542 Cervicalgia: Secondary | ICD-10-CM | POA: Insufficient documentation

## 2020-10-16 DIAGNOSIS — R0789 Other chest pain: Secondary | ICD-10-CM

## 2020-10-16 DIAGNOSIS — R0602 Shortness of breath: Secondary | ICD-10-CM | POA: Insufficient documentation

## 2020-10-16 DIAGNOSIS — M25511 Pain in right shoulder: Secondary | ICD-10-CM | POA: Diagnosis present

## 2020-10-16 DIAGNOSIS — N921 Excessive and frequent menstruation with irregular cycle: Secondary | ICD-10-CM | POA: Diagnosis present

## 2020-10-16 LAB — COMPREHENSIVE METABOLIC PANEL
ALT: 24 U/L (ref 0–44)
AST: 20 U/L (ref 15–41)
Albumin: 3.5 g/dL (ref 3.5–5.0)
Alkaline Phosphatase: 67 U/L (ref 38–126)
Anion gap: 9 (ref 5–15)
BUN: 8 mg/dL (ref 6–20)
CO2: 26 mmol/L (ref 22–32)
Calcium: 9.2 mg/dL (ref 8.9–10.3)
Chloride: 100 mmol/L (ref 98–111)
Creatinine, Ser: 0.6 mg/dL (ref 0.44–1.00)
GFR, Estimated: >60 mL/min (ref >60)
Glucose, Bld: 252 mg/dL — ABNORMAL HIGH (ref 70–99)
Potassium: 4.2 mmol/L (ref 3.5–5.1)
Sodium: 135 mmol/L (ref 135–145)
Total Bilirubin: 0.3 mg/dL (ref 0.3–1.2)
Total Protein: 6.9 g/dL (ref 6.5–8.1)

## 2020-10-16 LAB — CBC
HCT: 40.1 % (ref 36.0–46.0)
Hemoglobin: 13.1 g/dL (ref 12.0–15.0)
MCH: 31 pg (ref 26.0–34.0)
MCHC: 32.7 g/dL (ref 30.0–36.0)
MCV: 95 fL (ref 80.0–100.0)
Platelets: 337 10*3/uL (ref 150–400)
RBC: 4.22 MIL/uL (ref 3.87–5.11)
RDW: 12.2 % (ref 11.5–15.5)
WBC: 9.2 10*3/uL (ref 4.0–10.5)
nRBC: 0 % (ref 0.0–0.2)

## 2020-10-16 LAB — POCT URINALYSIS DIPSTICK, ED / UC
Bilirubin Urine: NEGATIVE
Glucose, UA: 100 mg/dL — AB
Ketones, ur: NEGATIVE mg/dL
Leukocytes,Ua: NEGATIVE
Nitrite: NEGATIVE
Protein, ur: 30 mg/dL — AB
Specific Gravity, Urine: 1.015 (ref 1.005–1.030)
Urobilinogen, UA: 0.2 mg/dL (ref 0.0–1.0)
pH: 5.5 (ref 5.0–8.0)

## 2020-10-16 LAB — POC URINE PREG, ED: Preg Test, Ur: NEGATIVE

## 2020-10-16 MED ORDER — BACLOFEN 10 MG PO TABS: 10.0000 mg | ORAL_TABLET | Freq: Two times a day (BID) | ORAL | 0 refills | Status: DC | PRN

## 2020-10-16 MED ORDER — MEGESTROL ACETATE 40 MG PO TABS: 40.0000 mg | ORAL_TABLET | Freq: Every day | ORAL | 0 refills | Status: DC

## 2020-10-16 NOTE — Discharge Instructions (Addendum)
We will contact you if your lab work is abnormal.  I have called in a medication called Megace that will help stop your menstrual cycle.  Please take this once daily for 14 days.  Once you start this medication you will likely have a menstrual cycle but hopefully this will be less intense.  It is important that you follow-up with your primary care provider and someone should be contacting you within a week or 2.  If you have any difficulty getting in with them please come back to our clinic for reevaluation.  I have called in baclofen to be used twice daily as needed for back/muscle pain.  This can make you sleepy do not drive or drink alcohol while taking it.  You can use Tylenol for additional pain relief.  Use heat, rest, stretch for additional symptom relief.  If anything worsens please return for reevaluation.

## 2020-10-16 NOTE — ED Provider Notes (Signed)
[facility]    CSN: [id number] Arrival date & time: 10/16/20  4010      History   Chief Complaint Chief Complaint  Patient presents with   Shoulder Pain    HPI Kayla Flynn is a 30 y.o. female.   Patient presents today with a several day history of right neck/shoulder pain.  She denies any known injury or increase in activity prior to symptom onset.  Reports pain is rated 7 on a 0-10 pain scale, localized to right trapezius, described as aching, no aggravating or alleviating factors identified.  She has tried over-the-counter analgesics without improvement of symptoms.  She denies previous injury or surgery to shoulder in the past.  She denies any weakness, numbness, paresthesias.  She does report significant stress as she recently lost her mother.  As a result of this she has had some chest tightness and shortness of breath and is unsure if this is related to an underlying issue or stress.  She denies any active chest pain but reports an episode of chest tightness that occurred for several minutes prior to arriving here.  She is concerned that she may become anemic as she has had irregular menstrual bleeding for the past 3 weeks.  She does have a history of irregular menstrual cycles and is not currently prescribed any OCP.  She denies history of bleeding disorder.  She does not take any blood thinning medications.  She denies any blood in her stool, bruises, blood in her urine.  She denies any history of cardiovascular disease but does have several risk factors including diabetes and hypertension.  Reports a family history of cardiovascular disease as her mother recently died due to cardiovascular disease.  She is interested in having EKG obtained today given clinical presentation.   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, antepartum 07/21/2014   S/P cesarean section 07/14/2014   Obesity, morbid, BMI 82 ([place]) 07/18/2012   DM type 2 (diabetes mellitus, type 2) ([place]) 10/12/2010    Past Surgical History:  Procedure Laterality Date   APPENDECTOMY  07/17/2012   CESAREAN SECTION N/A 12/18/2014   Procedure: CESAREAN SECTION;  Surgeon: Osborne Oman, MD;  Location: [facility];  Service: Obstetrics;  Laterality: N/A;   LAPAROSCOPIC APPENDECTOMY N/A 07/17/2012   Procedure: APPENDECTOMY LAPAROSCOPIC;  Surgeon: Zenovia Jarred, MD;  Location: [facility];  Service: General;  Laterality: N/A;    OB History     Gravida  1   Para  1   Term  1   Preterm      AB      Living  1      SAB      IAB      Ectopic      Multiple  0   Live Births  1            Home Medications    Prior to Admission medications   Medication Sig Start Date End Date Taking? Authorizing Provider  baclofen (LIORESAL) 10 MG tablet Take 1 tablet (  10 mg total) by mouth 2 (two) times daily as needed for muscle spasms. 10/16/20  Yes Jullian Clayson, Derry Skill, PA-C  megestrol (MEGACE) 40 MG tablet Take 1 tablet (40 mg total) by mouth daily. 10/16/20  Yes Ravan Schlemmer, Derry Skill, PA-C  Accu-Chek [employer] Lancets MISC Use to check FSBS twice a day. Dx: E11.65 Z79.4 12/05/19   Nicolette Bang, MD  Blood Glucose Monitoring Suppl (ACCU-CHEK GUIDE ME) w/Device KIT Use to check FSBS twice a day. Dx: E11.65 Z79.4 12/05/19   Nicolette Bang, MD  glucose blood (ACCU-CHEK GUIDE) test strip Use to check FSBS twice a day. Dx: E11.65 Z79.4 12/05/19   Nicolette Bang, MD  ibuprofen (ADVIL) 800 MG tablet Take 1 tablet (800 mg total) by mouth 3 (three) times daily.  07/31/20   Wieters, Hallie C, PA-C  Insulin Pen Needle (B-D UF III MINI PEN NEEDLES) 31G X 5 MM MISC Use as instructed. Inject into the skin once daily  E11.65 Z79.4 02/12/19   Gildardo Pounds, NP  Lancets Misc. (ACCU-CHEK FASTCLIX LANCET) KIT Use to check FSBS twice a day. Dx: E11.65 Z79.4 12/05/19   Nicolette Bang, MD  liraglutide (VICTOZA) 18 MG/3ML SOPN Inject 1.8 mg into the skin daily. 12/05/19   Nicolette Bang, MD  lisinopril (ZESTRIL) 5 MG tablet Take 1 tablet (5 mg total) by mouth daily. 02/12/19   Gildardo Pounds, NP  metFORMIN (GLUCOPHAGE) 1000 MG tablet Take 1 tablet (1,000 mg total) by mouth 2 (two) times daily with a meal. Take one tablet (59m)  by mouth twice a day for 7 days. Increase to 2 tablets (1000 mg) by mouth twice a day the following week  E11.65 Z79.4 12/05/19   WNicolette Bang MD    Family History Family History  Problem Relation Age of Onset   Hypertension Mother    Diabetes Mother    Hypertension Maternal Grandmother     Social History Social History   Tobacco Use   Smoking status: Every Day    Packs/day: 0.25    Years: 1.20    Pack years: 0.30    Types: Cigarettes   Smokeless tobacco: Former    Quit date: 12/10/2014  Substance Use Topics   Alcohol use: No    Comment: rarely   Drug use: No     Allergies   Patient has no known allergies.   Review of Systems Review of Systems  Constitutional:  Positive for activity change. Negative for appetite change, fatigue and fever.  Respiratory:  Positive for chest tightness and shortness of breath. Negative for cough.   Cardiovascular:  Negative for chest pain and palpitations.  Gastrointestinal:  Negative for abdominal pain, diarrhea, nausea and vomiting.  Musculoskeletal:  Positive for arthralgias. Negative for myalgias.  Neurological:  Negative for dizziness, light-headedness and headaches.    Physical Exam Triage Vital Signs ED Triage Vitals [10/16/20 0826]  Enc  Vitals Group     BP (!) 150/95     Pulse Rate 97     Resp 16     Temp 98.4 F (36.9 C)     Temp Source Oral     SpO2 99 %     Weight      Height      Head Circumference      Peak Flow      Pain Score 7     Pain Loc      Pain Edu?      [place] found.  Updated Vital Signs BP (!) 150/95 (BP Location: Right Arm)   Pulse 97   Temp 98.4 F (36.9 C) (Oral)   Resp 16   SpO2 99%   Visual Acuity Right Eye Distance:   Left Eye Distance:   Bilateral Distance:    Right Eye Near:   Left Eye Near:    Bilateral Near:     Physical Exam Vitals reviewed.  Constitutional:      General: She is awake. She is not in acute distress.    Appearance: Normal appearance. She is normal weight. She is not ill-appearing.     Comments: Very pleasant female appears stated age in no acute distress  HENT:     Head: Normocephalic and atraumatic.  Cardiovascular:     Rate and Rhythm: Normal rate and regular rhythm.     Heart sounds: Normal heart sounds, S1 normal and S2 normal. No murmur heard. Pulmonary:     Effort: Pulmonary effort is normal.     Breath sounds: Normal breath sounds. No wheezing, rhonchi or rales.     Comments: Clear to auscultation bilaterally Abdominal:     General: Bowel sounds are normal.     Palpations: Abdomen is soft.     Tenderness: There is no abdominal tenderness. There is no right CVA tenderness, left CVA tenderness, guarding or rebound.     Comments: Benign abdominal exam  Genitourinary:    Comments: Exam deferred Musculoskeletal:     Right shoulder: Tenderness present. No bony tenderness. Normal range of motion. Normal strength.     Comments: Right shoulder/neck: Normal active range of motion at neck and shoulder.  Tenderness palpation along trapezius.  No pain percussion of vertebrae or deformity/step-off noted.  Strength 5/5 bilateral upper extremities.  Negative empty can and drop arm.  Psychiatric:        Behavior: Behavior is cooperative.      UC Treatments / Results  Labs (all labs ordered are listed, but only abnormal results are displayed) Labs Reviewed  POCT URINALYSIS DIPSTICK, ED / UC - Abnormal; Notable for the following components:      Result Value   Glucose, UA 100 (*)    Hgb urine dipstick LARGE (*)    Protein, ur 30 (*)    All other components within normal limits  URINE CULTURE  CBC  COMPREHENSIVE METABOLIC PANEL  POC URINE PREG, ED    EKG   Radiology No results found.  Procedures Procedures (including critical care time)  Medications Ordered in UC Medications - No data to display  Initial Impression / Assessment and Plan / UC Course  I have reviewed the triage vital signs and the nursing notes.  Pertinent labs & imaging results that were available during my care of the patient were reviewed by me and considered in my medical decision making (see chart for details).      EKG obtained showed normal sinus rhythm with ventricular rate of 76 bpm and no ischemic changes.  Compared to 05/14/2015 tracing there are nonspecific ST changes in leads III as well as R progression.  Concern for anemia as contributing to symptoms with recent heavy menstrual bleeding, however, vitals are stable today without indication for emergent evaluation..  Urine pregnancy was negative in clinic today.  CBC and CMP obtained today-results pending.  We will contact patient if she is severely anemic with additional recommendations.  She was started on Megace to help manage heavy menstrual bleeding.  Discussed the importance of  following up with primary care provider and/or OB/GYN we will try to find her primary care provider through PCP assistance.  Discouraged her from using blood thinning medications including NSAIDs regularly; can use Tylenol for pain.  Discussed that shoulder pain is likely related to muscle strain given clinical presentation was started on baclofen with instruction not to drive or drink alcohol while taking  this medication as drowsiness is a common side effect.  Encouraged to use conservative treatment measures including heat, rest, stretch.  Discussed alarm symptoms that warrant emergent evaluation.  Strict return precautions given to which patient expressed understanding.  Final Clinical Impressions(s) / UC Diagnoses   Final diagnoses:  Acute pain of right shoulder  Neck pain  Menorrhagia with irregular cycle  Shortness of breath  Chest tightness  Grief     Discharge Instructions      We will contact you if your lab work is abnormal.  I have called in a medication called Megace that will help stop your menstrual cycle.  Please take this once daily for 14 days.  Once you start this medication you will likely have a menstrual cycle but hopefully this will be less intense.  It is important that you follow-up with your primary care provider and someone should be contacting you within a week or 2.  If you have any difficulty getting in with them please come back to our clinic for reevaluation.  I have called in baclofen to be used twice daily as needed for back/muscle pain.  This can make you sleepy do not drive or drink alcohol while taking it.  You can use Tylenol for additional pain relief.  Use heat, rest, stretch for additional symptom relief.  If anything worsens please return for reevaluation.     ED Prescriptions     Medication Sig Dispense Auth. Provider   megestrol (MEGACE) 40 MG tablet Take 1 tablet (40 mg total) by mouth daily. 14 tablet Roylee Chaffin K, PA-C   baclofen (LIORESAL) 10 MG tablet Take 1 tablet (10 mg total) by mouth 2 (two) times daily as needed for muscle spasms. 20 tablet Jyaire Koudelka, Derry Skill, PA-C      PDMP not reviewed this encounter.   Terrilee Croak, PA-C 10/16/20 3007

## 2020-10-16 NOTE — ED Triage Notes (Addendum)
Right shoulder pain extending into neck starting 3 days ago. C/o significant stress after recent loss of mother. Reports chest tightness this morning, mild shortness of breath, and weakness. Reports heavy vaginal bleeding since 7/25, states she isn't sure if it could've been a miscarriage.

## 2020-10-17 LAB — URINE CULTURE

## 2020-10-19 ENCOUNTER — Ambulatory Visit
Admission: EM | Admit: 2020-10-19 | Discharge: 2020-10-19 | Disposition: A | Discharge status: Home / Self Care | Payer: Medicaid Other | Attending: Urgent Care | Admitting: Urgent Care

## 2020-10-19 ENCOUNTER — Encounter: Payer: Self-pay | Admitting: Emergency Medicine

## 2020-10-19 ENCOUNTER — Other Ambulatory Visit: Payer: Self-pay

## 2020-10-19 DIAGNOSIS — K029 Dental caries, unspecified: Secondary | ICD-10-CM

## 2020-10-19 DIAGNOSIS — E119 Type 2 diabetes mellitus without complications: Secondary | ICD-10-CM

## 2020-10-19 DIAGNOSIS — K047 Periapical abscess without sinus: Secondary | ICD-10-CM

## 2020-10-19 DIAGNOSIS — Z794 Long term (current) use of insulin: Secondary | ICD-10-CM

## 2020-10-19 MED ORDER — LIDOCAINE VISCOUS HCL 2 % MT SOLN: 15.0000 mL | Freq: Four times a day (QID) | OROMUCOSAL | 0 refills | Status: DC | PRN

## 2020-10-19 MED ORDER — AMOXICILLIN-POT CLAVULANATE 875-125 MG PO TABS: 1.0000 | ORAL_TABLET | Freq: Two times a day (BID) | ORAL | 0 refills | Status: DC

## 2020-10-19 MED ORDER — NAPROXEN 500 MG PO TABS: 500.0000 mg | ORAL_TABLET | Freq: Two times a day (BID) | ORAL | 0 refills | Status: DC

## 2020-10-19 NOTE — ED Triage Notes (Signed)
Tooth pain starting on 8/12. Back lower right. Large cavity visible. Swishing cold water helps relieve the pain.

## 2020-10-19 NOTE — ED Provider Notes (Signed)
[place]   MRN: [id number] DOB: 1990/09/17  Subjective:   Kayla Flynn is a 30 y.o. female presenting for 3-day history of acute onset recurrent persistent and severe right lower sided dental pain, gum pain, facial pain.  Patient has a history of needing dental work but has only been able to get dental insurance recently and plans on setting up an appointment soon.  Denies fever, nausea, vomiting, chest pain, shortness of breath.  She does have a history of diabetes, treated with insulin.  No current facility-administered medications for this encounter.  Current Outpatient Medications:    amoxicillin-clavulanate (AUGMENTIN) 875-125 MG tablet, Take 1 tablet by mouth every 12 (twelve) hours., Disp: 20 tablet, Rfl: 0   lidocaine (XYLOCAINE) 2 % solution, Use as directed 15 mLs in the mouth or throat every 6 (six) hours as needed for mouth pain., Disp: 200 mL, Rfl: 0   naproxen (NAPROSYN) 500 MG tablet, Take 1 tablet (500 mg total) by mouth 2 (two) times daily with a meal., Disp: 30 tablet, Rfl: 0   Accu-Chek FastClix Lancets MISC, Use to check FSBS twice a day. Dx: E11.65 Z79.4, Disp: 100 each, Rfl: 4   baclofen (LIORESAL) 10 MG tablet, Take 1 tablet (10 mg total) by mouth 2 (two) times daily as needed for muscle spasms., Disp: 20 tablet, Rfl: 0   Blood Glucose Monitoring Suppl (ACCU-CHEK GUIDE ME) w/Device KIT, Use to check FSBS twice a day. Dx: E11.65 Z79.4, Disp: 1 kit, Rfl: 0   glucose blood (ACCU-CHEK GUIDE) test strip, Use to check FSBS twice a day. Dx: E11.65 Z79.4, Disp: 100 each, Rfl: 4   ibuprofen (ADVIL) 800 MG tablet, Take 1 tablet (800 mg total) by mouth 3 (three) times daily., Disp: 21 tablet, Rfl: 0   Insulin Pen Needle (B-D UF III MINI PEN NEEDLES) 31G X 5 MM MISC, Use as instructed. Inject into the skin once daily  E11.65 Z79.4, Disp: 90 each, Rfl: 3   Lancets Misc. (ACCU-CHEK FASTCLIX LANCET) KIT, Use to check FSBS twice a day. Dx: E11.65 Z79.4, Disp: 1 kit, Rfl:  0   liraglutide (VICTOZA) 18 MG/3ML SOPN, Inject 1.8 mg into the skin daily., Disp: 15 mL, Rfl: 2   lisinopril (ZESTRIL) 5 MG tablet, Take 1 tablet (5 mg total) by mouth daily., Disp: 90 tablet, Rfl: 3   megestrol (MEGACE) 40 MG tablet, Take 1 tablet (40 mg total) by mouth daily., Disp: 14 tablet, Rfl: 0   metFORMIN (GLUCOPHAGE) 1000 MG tablet, Take 1 tablet (1,000 mg total) by mouth 2 (two) times daily with a meal. Take one tablet (534m)  by mouth twice a day for 7 days. Increase to 2 tablets (1000 mg) by mouth twice a day the following week  E11.65 Z79.4, Disp: 180 tablet, Rfl: 1   No Known Allergies  Past Medical History:  Diagnosis Date   Acute appendicitis 07/18/2012   Hypertension    Obesity    Obesity, morbid, BMI 50 or higher ([facility] 07/18/2012   Severe preeclampsia 12/10/2014   Type 2 diabetes mellitus ([facility]      Past Surgical History:  Procedure Laterality Date   APPENDECTOMY  07/17/2012   CESAREAN SECTION N/A 12/18/2014   Procedure: CESAREAN SECTION;  Surgeon: UOsborne Oman MD;  [facility];  Service: Obstetrics;  Laterality: N/A;   LAPAROSCOPIC APPENDECTOMY N/A 07/17/2012   Procedure: APPENDECTOMY LAPAROSCOPIC;  Surgeon: BZenovia Jarred MD;  [facility]  Service: General;  Laterality: N/A;    Family  History  Problem Relation Age of Onset   Hypertension Mother    Diabetes Mother    Hypertension Maternal Grandmother     Social History   Tobacco Use   Smoking status: Every Day    Packs/day: 0.25    Years: 1.20    Pack years: 0.30    Types: Cigarettes   Smokeless tobacco: Former    Quit date: 12/10/2014  Substance Use Topics   Alcohol use: No    Comment: rarely   Drug use: No    ROS   Objective:   Vitals: BP (!) 161/91 (BP Location: Left Arm)   Pulse 98   Temp 98.8 F (37.1 C) (Oral)   Resp 16   SpO2 95%   Physical Exam Constitutional:      General: She is not in acute distress.    Appearance: Normal appearance. She is well-developed.  She is obese. She is not ill-appearing, toxic-appearing or diaphoretic.  HENT:     Head: Normocephalic and atraumatic.     Nose: Nose normal.     Mouth/Throat:     Mouth: Mucous membranes are moist.     Dentition: Abnormal dentition. Does not have dentures. Dental tenderness (worse over the right outer lower posterior side) and dental caries present. No gingival swelling, dental abscesses or gum lesions.      Comments: Multiple dental caries noted for the lower and upper right posterior molars.  The one outlined has a large defect. Eyes:     General: No scleral icterus.    Extraocular Movements: Extraocular movements intact.     Pupils: Pupils are equal, round, and reactive to light.  Cardiovascular:     Rate and Rhythm: Normal rate.  Pulmonary:     Effort: Pulmonary effort is normal.  Skin:    General: Skin is warm and dry.  Neurological:     General: No focal deficit present.     Mental Status: She is alert and oriented to person, place, and time.  Psychiatric:        Mood and Affect: Mood normal.        Behavior: Behavior normal.     Assessment and Plan :   PDMP not reviewed this encounter.  1. Dental infection   2. Dental caries   3. Type 2 diabetes mellitus treated with insulin (HCC)     Start Augmentin for dental infection/abscess, use naproxen for pain and inflammation.  Use viscous lidocaine for local pain relief.  Emphasized need for dental surgeon consult. Counseled patient on potential for adverse effects with medications prescribed/recommended today, strict ER and return-to-clinic precautions discussed, patient verbalized understanding.    Jaynee Eagles, [place] 10/19/20 1405

## 2020-10-19 NOTE — Discharge Instructions (Signed)
Make sure you schedule an appointment with a dentist/dental surgeon as soon as possible.  You may try some of the resources below.     [facility] [phone] extension [phone]  Dr. Civils [phone] [address]  [facility] [phone] [address]  [facility] [phone] extension [phone] [place], [place], [address] First come first serve for the first 10 clients.  May do simple extractions only, no wisdom teeth or surgery.  You may try the second for Thursday of the month starting at 6:30 AM.  [employer] You may call the school to see if they are still helping to provide dental care for emergent cases.  

## 2020-11-27 ENCOUNTER — Other Ambulatory Visit: Payer: Self-pay

## 2020-11-27 ENCOUNTER — Ambulatory Visit
Admission: EM | Admit: 2020-11-27 | Discharge: 2020-11-27 | Disposition: A | Discharge status: Home / Self Care | Payer: Medicaid Other | Attending: Internal Medicine | Admitting: Internal Medicine

## 2020-11-27 DIAGNOSIS — M545 Low back pain, unspecified: Secondary | ICD-10-CM

## 2020-11-27 DIAGNOSIS — S39012A Strain of muscle, fascia and tendon of lower back, initial encounter: Secondary | ICD-10-CM | POA: Diagnosis not present

## 2020-11-27 MED ORDER — IBUPROFEN 600 MG PO TABS: 600.0000 mg | ORAL_TABLET | Freq: Four times a day (QID) | ORAL | 0 refills | Status: DC | PRN

## 2020-11-27 NOTE — Discharge Instructions (Signed)
You have a low back strain.  You have been prescribed ibuprofen to take to decrease pain and inflammation.  Please do not take any additional ibuprofen, Advil, Aleve while taking this medication.  You may take Tylenol as needed as well.  Alternate ice and heat application to affected area.  Follow-up with primary care or urgent care if symptoms persist.

## 2020-11-27 NOTE — ED Triage Notes (Signed)
Pt c/o 8/10 sharp pain mid back lumbar that is worse when pt straitens posture onset last night.

## 2020-11-27 NOTE — ED Provider Notes (Signed)
[name]    CSN: [id number] Arrival date & time: 11/27/20  0915      History   Chief Complaint Chief Complaint  Patient presents with   Back Pain    HPI Kayla Flynn is a 30 y.o. female.   Patient presents with sharp bilateral lower back pain that started yesterday.  Pain is exacerbated by movement.  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  Denies any chronic history of back pain.  Denies any numbness or tingling.  Pain does not radiate.  Denies any urinary burning, urinary frequency, saddle anesthesia, urinary or bowel incontinence.   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, antepartum 07/21/2014   S/P cesarean section 07/14/2014   Obesity, morbid, BMI 82 ([place]) 07/18/2012   DM type 2 (diabetes mellitus, type 2) ([place]) 10/12/2010    Past Surgical History:  Procedure Laterality Date   APPENDECTOMY  07/17/2012   CESAREAN SECTION N/A 12/18/2014   Procedure: CESAREAN SECTION;  Surgeon: Osborne Oman, MD;  Location: [facility];  Service: Obstetrics;  Laterality: N/A;   LAPAROSCOPIC APPENDECTOMY N/A 07/17/2012   Procedure: APPENDECTOMY LAPAROSCOPIC;  Surgeon: Zenovia Jarred, MD;  [facility];  Service: General;  Laterality: N/A;    OB History     Gravida  1   Para  1   Term  1   Preterm      AB      Living  1      SAB      IAB      Ectopic      Multiple  0   Live Births  1            Home Medications    Prior to  Admission medications   Medication Sig Start Date End Date Taking? Authorizing Provider  ibuprofen (ADVIL) 600 MG tablet Take 1 tablet (600 mg total) by mouth every 6 (six) hours as needed for mild pain or moderate pain. 11/27/20  Yes Odis Luster, FNP  Accu-Chek FastClix Lancets MISC Use to check FSBS twice a day. Dx: E11.65 Z79.4 12/05/19   Nicolette Bang, MD  amoxicillin-clavulanate (AUGMENTIN) 875-125 MG tablet Take 1 tablet by mouth every 12 (twelve) hours. 10/19/20   Jaynee Eagles, PA-C  baclofen (LIORESAL) 10 MG tablet Take 1 tablet (10 mg total) by mouth 2 (two) times daily as needed for muscle spasms. 10/16/20   Raspet, Derry Skill, PA-C  Blood Glucose Monitoring Suppl (ACCU-CHEK GUIDE ME) w/Device KIT Use to check FSBS twice a day. Dx: E11.65 Z79.4 12/05/19   Nicolette Bang, MD  glucose blood (ACCU-CHEK GUIDE) test strip Use to check FSBS twice a day. Dx: E11.65 Z79.4 12/05/19   Nicolette Bang, MD  Insulin Pen Needle (B-D UF III MINI PEN NEEDLES) 31G X 5 MM MISC Use as instructed. Inject into the skin once daily  E11.65 Z79.4 02/12/19   Gildardo Pounds, NP  Lancets Misc. (ACCU-CHEK FASTCLIX LANCET) KIT Use to check FSBS twice a day. Dx: E11.65 Z79.4 12/05/19   Nicolette Bang, MD  lidocaine (XYLOCAINE) 2 % solution Use as directed 15 mLs in the mouth or throat every 6 (six) hours as needed for mouth pain. 10/19/20   Jaynee Eagles, PA-C  liraglutide (VICTOZA) 18 MG/3ML SOPN Inject 1.8 mg into the skin daily. 12/05/19   Nicolette Bang, MD  lisinopril (ZESTRIL) 5 MG tablet Take 1 tablet (5 mg total) by mouth daily. 02/12/19   Gildardo Pounds, NP  megestrol (MEGACE) 40 MG tablet Take 1 tablet (40 mg total) by mouth daily. 10/16/20   Raspet, Derry Skill, PA-C  metFORMIN (GLUCOPHAGE) 1000 MG tablet Take 1 tablet (1,000 mg total) by mouth 2 (two) times daily with a meal. Take one tablet (577m)  by mouth twice a day for 7 days. Increase to 2 tablets (1000 mg) by  mouth twice a day the following week  E11.65 Z79.4 12/05/19   WNicolette Bang MD  naproxen (NAPROSYN) 500 MG tablet Take 1 tablet (500 mg total) by mouth 2 (two) times daily with a meal. 10/19/20   MJaynee Eagles PA-C    Family History Family History  Problem Relation Age of Onset   Hypertension Mother    Diabetes Mother    Hypertension Maternal Grandmother     Social History Social History   Tobacco Use   Smoking status: Every Day    Packs/day: 0.25    Years: 1.20    Pack years: 0.30    Types: Cigarettes   Smokeless tobacco: Former    Quit date: 12/10/2014  Substance Use Topics   Alcohol use: No    Comment: rarely   Drug use: No     Allergies   Patient has no known allergies.   Review of Systems Review of Systems Per HPI  Physical Exam Triage Vital Signs ED Triage Vitals [11/27/20 0929]  Enc Vitals Group     BP (!) 144/99     Pulse Rate 92     Resp 18     Temp 98.1 F (36.7 C)     Temp Source Oral     SpO2 97 %     Weight      Height      Head Circumference      Peak Flow      Pain Score 8     Pain Loc      Pain Edu?      Excl. in [place]    No data found.  Updated Vital Signs BP (!) 144/99 (BP Location: Left Arm)   Pulse 92   Temp 98.1 F (36.7 C) (Oral)   Resp 18   SpO2 97%   Visual Acuity Right Eye Distance:   Left Eye Distance:   Bilateral Distance:    Right Eye Near:   Left Eye Near:    Bilateral Near:     Physical Exam Constitutional:      Appearance: Normal appearance.  HENT:     Head: Normocephalic and atraumatic.  Eyes:     Extraocular Movements: Extraocular movements intact.     Conjunctiva/sclera: Conjunctivae normal.  Pulmonary:     Effort: Pulmonary effort is normal.  Musculoskeletal:     Cervical back: Normal.     Thoracic back: Normal.     Lumbar back: Tenderness present. No swelling, edema, deformity, lacerations or bony tenderness. Normal range of motion. Negative right straight leg raise test  and negative  left straight leg raise test.     Comments: Tenderness to palpation to bilateral lumbar regions at paraspinal muscles.  Neurological:     General: No focal deficit present.     Mental Status: She is alert and oriented to person, place, and time. Mental status is at baseline.     Deep Tendon Reflexes: Reflexes are normal and symmetric.  Psychiatric:        Mood and Affect: Mood normal.        Behavior: Behavior normal.        Thought Content: Thought content normal.        Judgment: Judgment normal.     UC Treatments / Results  Labs (all labs ordered are listed, but only abnormal results are displayed) Labs Reviewed - No data to display  EKG   Radiology No results found.  Procedures Procedures (including critical care time)  Medications Ordered in UC Medications - No data to display  Initial Impression / Assessment and Plan / UC Course  I have reviewed the triage vital signs and the nursing notes.  Pertinent labs & imaging results that were available during my care of the patient were reviewed by me and considered in my medical decision making (see chart for details).     Physical exam and symptoms are most consistent with lumbar strain.  Patient was prescribed ibuprofen to take to decrease inflammation and pain.  Patient to alternate ice and heat application.  No red flags seen on exam.  Discussed strict return precautions. Patient verbalized understanding and is agreeable with plan.  Final Clinical Impressions(s) / UC Diagnoses   Final diagnoses:  Lumbar strain, initial encounter  Acute bilateral low back pain without sciatica     Discharge Instructions      You have a low back strain.  You have been prescribed ibuprofen to take to decrease pain and inflammation.  Please do not take any additional ibuprofen, Advil, Aleve while taking this medication.  You may take Tylenol as needed as well.  Alternate ice and heat application to affected area.  Follow-up with  primary care or urgent care if symptoms persist.     ED Prescriptions     Medication Sig Dispense Auth. Provider   ibuprofen (ADVIL) 600 MG tablet Take 1 tablet (600 mg total) by mouth every 6 (six) hours as needed for mild pain or moderate pain. 30 tablet Odis Luster, FNP      PDMP not reviewed this encounter.   Odis Luster, [place] 11/27/20 [phone]

## 2020-12-17 ENCOUNTER — Ambulatory Visit
Admission: EM | Admit: 2020-12-17 | Discharge: 2020-12-17 | Disposition: A | Discharge status: Home / Self Care | Payer: Medicaid Other | Attending: Internal Medicine | Admitting: Internal Medicine

## 2020-12-17 ENCOUNTER — Other Ambulatory Visit: Payer: Self-pay

## 2020-12-17 DIAGNOSIS — Z3202 Encounter for pregnancy test, result negative: Secondary | ICD-10-CM | POA: Diagnosis not present

## 2020-12-17 DIAGNOSIS — N898 Other specified noninflammatory disorders of vagina: Secondary | ICD-10-CM

## 2020-12-17 DIAGNOSIS — Z113 Encounter for screening for infections with a predominantly sexual mode of transmission: Secondary | ICD-10-CM

## 2020-12-17 LAB — POCT URINE PREGNANCY: Preg Test, Ur: NEGATIVE

## 2020-12-17 NOTE — ED Triage Notes (Signed)
Pt c/o vaginal itching, discharge (thick, white, moderate amount). Denies hematuria, frequency, pelvic or lower back pain. Onset 2 days ago.

## 2020-12-17 NOTE — ED Provider Notes (Addendum)
[name]    CSN: [id number] Arrival date & time: 12/17/20  0804      History   Chief Complaint Chief Complaint  Patient presents with   Vaginal Itching    HPI Kayla Flynn is a 30 y.o. female.   Patient presents with vaginal discharge and vaginal itching that started approximately 1 to 2 days ago.  Vaginal discharge is white in color.  Patient denies any urinary burning, urinary frequency, pelvic pain, abdominal pain, fever, back pain, irregular vaginal bleeding.  Last menstrual cycle was on 11/30/2020.  Denies any known exposure to STD.  Patient does not use any birth control protection during sexual intercourse. Denies any recent antibiotic treatment.   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, antepartum 07/21/2014   S/P cesarean section 07/14/2014   Obesity, morbid, BMI 82 ([place]) 07/18/2012   DM type 2 (diabetes mellitus, type 2) ([place]) 10/12/2010    Past Surgical History:  Procedure Laterality Date   APPENDECTOMY  07/17/2012   CESAREAN SECTION N/A 12/18/2014   Procedure: CESAREAN SECTION;  Surgeon: Osborne Oman, MD;  Location: [facility];  Service: Obstetrics;  Laterality: N/A;   LAPAROSCOPIC APPENDECTOMY N/A 07/17/2012   Procedure: APPENDECTOMY LAPAROSCOPIC;  Surgeon: Zenovia Jarred, MD;  [facility];  Service: General;  Laterality: N/A;    OB History     Gravida  1   Para  1   Term  1   Preterm      AB      Living  1      SAB      IAB      Ectopic      Multiple  0   Live Births   1            Home Medications    Prior to Admission medications   Medication Sig Start Date End Date Taking? Authorizing Provider  Accu-Chek FastClix Lancets MISC Use to check FSBS twice a day. Dx: E11.65 Z79.4 12/05/19   Nicolette Bang, MD  amoxicillin-clavulanate (AUGMENTIN) 875-125 MG tablet Take 1 tablet by mouth every 12 (twelve) hours. 10/19/20   Jaynee Eagles, PA-C  baclofen (LIORESAL) 10 MG tablet Take 1 tablet (10 mg total) by mouth 2 (two) times daily as needed for muscle spasms. 10/16/20   Raspet, Derry Skill, PA-C  Blood Glucose Monitoring Suppl (ACCU-CHEK GUIDE ME) w/Device KIT Use to check FSBS twice a day. Dx: E11.65 Z79.4 12/05/19   Nicolette Bang, MD  glucose blood (ACCU-CHEK GUIDE) test strip Use to check FSBS twice a day. Dx: E11.65 Z79.4 12/05/19   Nicolette Bang, MD  ibuprofen (ADVIL) 600 MG tablet Take 1 tablet (600 mg total) by mouth every 6 (six) hours as needed for mild pain or moderate pain. 11/27/20   Odis Luster, FNP  Insulin Pen Needle (B-D UF III MINI PEN NEEDLES) 31G X 5 MM MISC Use as instructed. Inject into the skin once daily  E11.65 Z79.4 02/12/19   Gildardo Pounds,  NP  Lancets Misc. (ACCU-CHEK FASTCLIX LANCET) KIT Use to check FSBS twice a day. Dx: E11.65 Z79.4 12/05/19   Nicolette Bang, MD  lidocaine (XYLOCAINE) 2 % solution Use as directed 15 mLs in the mouth or throat every 6 (six) hours as needed for mouth pain. 10/19/20   Jaynee Eagles, PA-C  liraglutide (VICTOZA) 18 MG/3ML SOPN Inject 1.8 mg into the skin daily. 12/05/19   Nicolette Bang, MD  lisinopril (ZESTRIL) 5 MG tablet Take 1 tablet (5 mg total) by mouth daily. 02/12/19   Gildardo Pounds, NP  megestrol (MEGACE) 40 MG tablet Take 1 tablet (40 mg total) by mouth daily. 10/16/20   Raspet, Derry Skill, PA-C  metFORMIN (GLUCOPHAGE) 1000 MG tablet Take 1 tablet (1,000 mg total) by mouth 2 (two) times daily with a meal. Take one tablet (569m)  by mouth twice a day  for 7 days. Increase to 2 tablets (1000 mg) by mouth twice a day the following week  E11.65 Z79.4 12/05/19   WNicolette Bang MD  naproxen (NAPROSYN) 500 MG tablet Take 1 tablet (500 mg total) by mouth 2 (two) times daily with a meal. 10/19/20   MJaynee Eagles PA-C    Family History Family History  Problem Relation Age of Onset   Hypertension Mother    Diabetes Mother    Hypertension Maternal Grandmother     Social History Social History   Tobacco Use   Smoking status: Every Day    Packs/day: 0.25    Years: 1.20    Pack years: 0.30    Types: Cigarettes   Smokeless tobacco: Former    Quit date: 12/10/2014  Substance Use Topics   Alcohol use: No    Comment: rarely   Drug use: No     Allergies   Patient has no known allergies.   Review of Systems Review of Systems Per HPI  Physical Exam Triage Vital Signs ED Triage Vitals  Enc Vitals Group     BP 12/17/20 0817 (!) 159/111     Pulse Rate 12/17/20 0816 79     Resp 12/17/20 0816 18     Temp 12/17/20 0816 98.1 F (36.7 C)     Temp Source 12/17/20 0816 Oral     SpO2 12/17/20 0816 96 %     Weight --      Height --      Head Circumference --      Peak Flow --      Pain Score 12/17/20 0817 0     Pain Loc --      Pain Edu? --      Excl. in [place]    No data found.  Updated Vital Signs BP (!) 159/111 (BP Location: Left Arm)   Pulse 79   Temp 98.1 F (36.7 C) (Oral)   Resp 18   SpO2 96%   Visual Acuity Right Eye Distance:   Left Eye Distance:   Bilateral Distance:    Right Eye Near:   Left Eye Near:    Bilateral Near:     Physical Exam Constitutional:      General: She is not in acute distress.    Appearance: Normal appearance. She is not toxic-appearing or diaphoretic.  HENT:     Head: Normocephalic and atraumatic.  Eyes:     Extraocular Movements: Extraocular movements intact.     Conjunctiva/sclera: Conjunctivae normal.  Pulmonary:     Effort: Pulmonary effort is normal.   Genitourinary:  Comments: Deferred with shared decision making.  Self swab performed. Neurological:     General: No focal deficit present.     Mental Status: She is alert and oriented to person, place, and time. Mental status is at baseline.  Psychiatric:        Mood and Affect: Mood normal.        Behavior: Behavior normal.        Thought Content: Thought content normal.        Judgment: Judgment normal.     UC Treatments / Results  Labs (all labs ordered are listed, but only abnormal results are displayed) Labs Reviewed  POCT URINE PREGNANCY  CERVICOVAGINAL ANCILLARY ONLY    EKG   Radiology No results found.  Procedures Procedures (including critical care time)  Medications Ordered in UC Medications - No data to display  Initial Impression / Assessment and Plan / UC Course  I have reviewed the triage vital signs and the nursing notes.  Pertinent labs & imaging results that were available during my care of the patient were reviewed by me and considered in my medical decision making (see chart for details).     Cervicovaginal swab is pending to determine cause of vaginal discharge.  Will await results for treatment.  Patient advised to refrain from activity until test results are complete.  Urine pregnancy was negative. Patient has elevated blood pressure reading today that is consistent with previous visits. No signs of hypertensive urgency. Discussed strict return precautions. Patient verbalized understanding and is agreeable with plan.  Final Clinical Impressions(s) / UC Diagnoses   Final diagnoses:  Vaginal discharge  Screening examination for venereal disease  Pregnancy test negative     Discharge Instructions      Your vaginal swab is pending.  We will call results and treat as appropriate.  Please refrain from sexual activity until test results and treatment are complete.     ED Prescriptions   None    PDMP not reviewed this encounter.    Odis Luster, FNP 12/17/20 [place], [place], FNP 12/17/20 [phone]

## 2020-12-17 NOTE — Discharge Instructions (Signed)
Your vaginal swab is pending.  We will call results and treat as appropriate.  Please refrain from sexual activity until test results and treatment are complete.

## 2020-12-18 LAB — CERVICOVAGINAL ANCILLARY ONLY
Bacterial Vaginitis (gardnerella): POSITIVE — AB
Candida Glabrata: NEGATIVE
Candida Vaginitis: POSITIVE — AB
Chlamydia: NEGATIVE
Comment: NEGATIVE
Comment: NEGATIVE
Comment: NEGATIVE
Comment: NEGATIVE
Comment: NEGATIVE
Comment: NORMAL
Neisseria Gonorrhea: NEGATIVE
Trichomonas: NEGATIVE

## 2020-12-21 ENCOUNTER — Telehealth (HOSPITAL_COMMUNITY): Payer: Self-pay | Admitting: Emergency Medicine

## 2020-12-21 MED ORDER — METRONIDAZOLE 500 MG PO TABS: 500.0000 mg | ORAL_TABLET | Freq: Two times a day (BID) | ORAL | 0 refills | Status: DC

## 2020-12-21 MED ORDER — FLUCONAZOLE 150 MG PO TABS: 150.0000 mg | ORAL_TABLET | Freq: Once | ORAL | 0 refills | Status: AC

## 2020-12-28 ENCOUNTER — Ambulatory Visit
Admission: EM | Admit: 2020-12-28 | Discharge: 2020-12-28 | Disposition: A | Discharge status: Home / Self Care | Payer: Medicaid Other | Attending: Physician Assistant | Admitting: Physician Assistant

## 2020-12-28 ENCOUNTER — Other Ambulatory Visit: Payer: Self-pay

## 2020-12-28 DIAGNOSIS — Z20822 Contact with and (suspected) exposure to covid-19: Secondary | ICD-10-CM

## 2020-12-28 DIAGNOSIS — J069 Acute upper respiratory infection, unspecified: Secondary | ICD-10-CM | POA: Diagnosis not present

## 2020-12-28 NOTE — ED Provider Notes (Signed)
[name]    CSN: [id number] Arrival date & time: 12/28/20  0806      History   Chief Complaint Chief Complaint  Patient presents with   Nasal Congestion    HPI Kayla Flynn is a 30 y.o. female.   Patient here today for evaluation of nasal congestion and drainage, mild sore throat, postnasal drip and sneezing that started 2 days ago.  She did have night sweats last night but has not had measured temperature to determine if she has had fever.  She denies any vomiting or diarrhea but has had some nausea.  She has tried taking Tylenol which has helped somewhat but has not had resolution of symptoms with same.   The history is provided by the patient.   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, antepartum 07/21/2014   S/P cesarean section 07/14/2014   Obesity, morbid, BMI 82 ([place]) 07/18/2012   DM type 2 (diabetes mellitus, type 2) ([place]) 10/12/2010    Past Surgical History:  Procedure Laterality Date   APPENDECTOMY  07/17/2012   CESAREAN SECTION N/A 12/18/2014   Procedure: CESAREAN SECTION;  Surgeon: Osborne Oman, MD;  Location: [facility];  Service: Obstetrics;  Laterality: N/A;   LAPAROSCOPIC APPENDECTOMY N/A 07/17/2012   Procedure: APPENDECTOMY LAPAROSCOPIC;  Surgeon: Zenovia Jarred, MD;  Location: [facility];  Service: General;  Laterality: N/A;    OB History     Gravida  1   Para  1   Term  1   Preterm      AB      Living  1      SAB      IAB      Ectopic      Multiple  0   Live Births  1            Home  Medications    Prior to Admission medications   Medication Sig Start Date End Date Taking? Authorizing Provider  Accu-Chek FastClix Lancets MISC Use to check FSBS twice a day. Dx: E11.65 Z79.4 12/05/19   Nicolette Bang, MD  baclofen (LIORESAL) 10 MG tablet Take 1 tablet (10 mg total) by mouth 2 (two) times daily as needed for muscle spasms. 10/16/20   Raspet, Derry Skill, PA-C  Blood Glucose Monitoring Suppl (ACCU-CHEK GUIDE ME) w/Device KIT Use to check FSBS twice a day. Dx: E11.65 Z79.4 12/05/19   Nicolette Bang, MD  glucose blood (ACCU-CHEK GUIDE) test strip Use to check FSBS twice a day. Dx: E11.65 Z79.4 12/05/19   Nicolette Bang, MD  ibuprofen (ADVIL) 600 MG tablet Take 1 tablet (600 mg total) by mouth every 6 (six) hours as needed for mild pain or moderate pain. 11/27/20   Teodora Medici, FNP  Insulin Pen Needle (B-D UF III MINI PEN NEEDLES) 31G X 5 MM MISC Use as instructed. Inject into the skin once daily  E11.65 Z79.4 02/12/19   Gildardo Pounds, NP  Lancets Misc. (ACCU-CHEK FASTCLIX LANCET) KIT Use to check FSBS twice a  day. Dx: E11.65 Z79.4 12/05/19   Nicolette Bang, MD  liraglutide (VICTOZA) 18 MG/3ML SOPN Inject 1.8 mg into the skin daily. 12/05/19   Nicolette Bang, MD  lisinopril (ZESTRIL) 5 MG tablet Take 1 tablet (5 mg total) by mouth daily. 02/12/19   Gildardo Pounds, NP  metFORMIN (GLUCOPHAGE) 1000 MG tablet Take 1 tablet (1,000 mg total) by mouth 2 (two) times daily with a meal. Take one tablet (527m)  by mouth twice a day for 7 days. Increase to 2 tablets (1000 mg) by mouth twice a day the following week  E11.65 Z79.4 12/05/19   WNicolette Bang MD    Family History Family History  Problem Relation Age of Onset   Hypertension Mother    Diabetes Mother    Hypertension Maternal Grandmother     Social History Social History   Tobacco Use   Smoking status: Every Day    Packs/day: 0.25    Years: 1.20    Pack years: 0.30     Types: Cigarettes   Smokeless tobacco: Former    Quit date: 12/10/2014  Substance Use Topics   Alcohol use: No    Comment: rarely   Drug use: No     Allergies   Patient has no known allergies.   Review of Systems Review of Systems  Constitutional:  Negative for chills and fever.  HENT:  Positive for congestion, sinus pressure and sore throat. Negative for ear pain.   Eyes:  Negative for discharge and redness.  Respiratory:  Positive for cough. Negative for shortness of breath and wheezing.   Gastrointestinal:  Positive for nausea. Negative for abdominal pain, diarrhea and vomiting.    Physical Exam Triage Vital Signs ED Triage Vitals  Enc Vitals Group     BP      Pulse      Resp      Temp      Temp src      SpO2      Weight      Height      Head Circumference      Peak Flow      Pain Score      Pain Loc      Pain Edu?      [place]cl. in [place]    No data found.  Updated Vital Signs BP (!) 159/113 (BP Location: Left Wrist)   Pulse 90   Temp 98.9 F (37.2 C) (Oral)   Resp 18   LMP 11/26/2020   SpO2 94%      Physical Exam Vitals and nursing note reviewed.  Constitutional:      General: She is not in acute distress.    Appearance: Normal appearance. She is not ill-appearing.  HENT:     Head: Normocephalic and atraumatic.     Nose: Congestion present.     Mouth/Throat:     Mouth: Mucous membranes are moist.     Pharynx: No oropharyngeal exudate or posterior oropharyngeal erythema.  Eyes:     Conjunctiva/sclera: Conjunctivae normal.  Cardiovascular:     Rate and Rhythm: Normal rate and regular rhythm.     Heart sounds: Normal heart sounds. No murmur heard. Pulmonary:     Effort: Pulmonary effort is normal. No respiratory distress.     Breath sounds: Normal breath sounds. No wheezing, rhonchi or rales.  Skin:    General: Skin is warm and dry.  Neurological:     Mental Status: She is alert.  Psychiatric:  Mood and Affect: Mood normal.         Thought Content: Thought content normal.     UC Treatments / Results  Labs (all labs ordered are listed, but only abnormal results are displayed) Labs Reviewed  NOVEL CORONAVIRUS, NAA    EKG   Radiology No results found.  Procedures Procedures (including critical care time)  Medications Ordered in UC Medications - No data to display  Initial Impression / Assessment and Plan / UC Course  I have reviewed the triage vital signs and the nursing notes.  Pertinent labs & imaging results that were available during my care of the patient were reviewed by me and considered in my medical decision making (see chart for details).  Suspect likely viral etiology of symptoms and will screen for COVID.  Otherwise discussed symptomatic treatment and rest. Encouraged follow up with any further concerns.   Final Clinical Impressions(s) / UC Diagnoses   Final diagnoses:  Encounter for screening laboratory testing for COVID-19 virus  Acute upper respiratory infection     Discharge Instructions      Continue symptomatic treatment. Follow up with any concerns.      ED Prescriptions   None    PDMP not reviewed this encounter.   Francene Finders, PA-C 12/28/20 [phone]

## 2020-12-28 NOTE — ED Triage Notes (Signed)
Pt c/o nasal congestion, scratchy throat, cough, and sneezing since Saturday.

## 2020-12-28 NOTE — Discharge Instructions (Signed)
Continue symptomatic treatment. Follow up with any concerns.  

## 2020-12-29 LAB — SARS-COV-2, NAA 2 DAY TAT

## 2020-12-29 LAB — NOVEL CORONAVIRUS, NAA: SARS-CoV-2, NAA: NOT DETECTED

## 2021-01-11 ENCOUNTER — Ambulatory Visit
Admission: EM | Admit: 2021-01-11 | Discharge: 2021-01-11 | Disposition: A | Discharge status: Home / Self Care | Payer: Medicaid Other | Attending: Physician Assistant | Admitting: Physician Assistant

## 2021-01-11 ENCOUNTER — Other Ambulatory Visit: Payer: Self-pay

## 2021-01-11 DIAGNOSIS — J029 Acute pharyngitis, unspecified: Secondary | ICD-10-CM

## 2021-01-11 LAB — POCT RAPID STREP A (OFFICE): Rapid Strep A Screen: NEGATIVE

## 2021-01-11 NOTE — ED Provider Notes (Signed)
[name]    CSN: [id number] Arrival date & time: 01/11/21  1508      History   Chief Complaint Chief Complaint  Patient presents with   Sore Throat    HPI Kayla Flynn is a 30 y.o. female.   Patient here today for evaluation of sore throat that started a few days ago.  She has had some mild nasal congestion as well.  She has not had fever that she is aware of.  She does report pain is worse with swallowing.  She has not had any vomiting or diarrhea.  Has tried Tylenol without significant relief.  The history is provided by the patient.   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, antepartum 07/21/2014   S/P cesarean section 07/14/2014   Obesity, morbid, BMI 82 ([place]) 07/18/2012   DM type 2 (diabetes mellitus, type 2) ([place]) 10/12/2010    Past Surgical History:  Procedure Laterality Date   APPENDECTOMY  07/17/2012   CESAREAN SECTION N/A 12/18/2014   Procedure: CESAREAN SECTION;  Surgeon: Osborne Oman, MD;  Location: [facility];  Service: Obstetrics;  Laterality: N/A;   LAPAROSCOPIC APPENDECTOMY N/A 07/17/2012   Procedure: APPENDECTOMY LAPAROSCOPIC;  Surgeon: Zenovia Jarred, MD;  Location: [facility];  Service: General;  Laterality: N/A;    OB History     Gravida  1   Para  1   Term  1   Preterm      AB      Living  1      SAB      IAB      Ectopic      Multiple  0   Live Births  1            Home Medications    Prior to Admission medications   Medication Sig Start Date End Date Taking? Authorizing Provider   Accu-Chek FastClix Lancets MISC Use to check FSBS twice a day. Dx: E11.65 Z79.4 12/05/19   Nicolette Bang, MD  baclofen (LIORESAL) 10 MG tablet Take 1 tablet (10 mg total) by mouth 2 (two) times daily as needed for muscle spasms. 10/16/20   Raspet, Derry Skill, PA-C  Blood Glucose Monitoring Suppl (ACCU-CHEK GUIDE ME) w/Device KIT Use to check FSBS twice a day. Dx: E11.65 Z79.4 12/05/19   Nicolette Bang, MD  glucose blood (ACCU-CHEK GUIDE) test strip Use to check FSBS twice a day. Dx: E11.65 Z79.4 12/05/19   Nicolette Bang, MD  ibuprofen (ADVIL) 600 MG tablet Take 1 tablet (600 mg total) by mouth every 6 (six) hours as needed for mild pain or moderate pain. 11/27/20   Teodora Medici, FNP  Insulin Pen Needle (B-D UF III MINI PEN NEEDLES) 31G X 5 MM MISC Use as instructed. Inject into the skin once daily  E11.65 Z79.4 02/12/19   Gildardo Pounds, NP  Lancets Misc. (ACCU-CHEK FASTCLIX LANCET) KIT Use to check FSBS twice a day. Dx: E11.65 Z79.4 12/05/19   Nicolette Bang, MD  liraglutide ([place]) 18  MG/3ML SOPN Inject 1.8 mg into the skin daily. 12/05/19   Nicolette Bang, MD  lisinopril (ZESTRIL) 5 MG tablet Take 1 tablet (5 mg total) by mouth daily. 02/12/19   Gildardo Pounds, NP  metFORMIN (GLUCOPHAGE) 1000 MG tablet Take 1 tablet (1,000 mg total) by mouth 2 (two) times daily with a meal. Take one tablet (510m)  by mouth twice a day for 7 days. Increase to 2 tablets (1000 mg) by mouth twice a day the following week  E11.65 Z79.4 12/05/19   WNicolette Bang MD    Family History Family History  Problem Relation Age of Onset   Hypertension Mother    Diabetes Mother    Hypertension Maternal Grandmother     Social History Social History   Tobacco Use   Smoking status: Every Day    Packs/day: 0.25    Years: 1.20    Pack years: 0.30    Types: Cigarettes   Smokeless tobacco: Former    Quit date: 12/10/2014  Substance Use Topics   Alcohol use:  No    Comment: rarely   Drug use: No     Allergies   Patient has no known allergies.   Review of Systems Review of Systems  Constitutional:  Negative for chills and fever.  HENT:  Positive for congestion and sore throat. Negative for ear pain and sinus pressure.   Eyes:  Negative for discharge and redness.  Respiratory:  Negative for cough, shortness of breath and wheezing.   Gastrointestinal:  Negative for abdominal pain, diarrhea, nausea and vomiting.    Physical Exam Triage Vital Signs ED Triage Vitals  Enc Vitals Group     BP      Pulse      Resp      Temp      Temp src      SpO2      Weight      Height      Head Circumference      Peak Flow      Pain Score      Pain Loc      Pain Edu?      [place]cl[place] in [place]    No data found.  Updated Vital Signs BP (!) 189/109 (BP Location: Left Arm)   Pulse 93   Temp 98.3 F (36.8 C) (Oral)   Resp 18   LMP 01/06/2021   SpO2 96%      Physical Exam Vitals and nursing note reviewed.  Constitutional:      General: She is not in acute distress.    Appearance: Normal appearance. She is not ill-appearing.  HENT:     Head: Normocephalic and atraumatic.     Right Ear: Tympanic membrane normal.     Left Ear: Tympanic membrane normal.     Nose: No congestion.     Mouth/Throat:     Mouth: Mucous membranes are moist.     Pharynx: Oropharyngeal exudate and posterior oropharyngeal erythema present.  Eyes:     Conjunctiva/sclera: Conjunctivae normal.  Cardiovascular:     Rate and Rhythm: Normal rate and regular rhythm.     Heart sounds: Normal heart sounds. No murmur heard. Pulmonary:     Effort: Pulmonary effort is normal. No respiratory distress.     Breath sounds: Normal breath sounds. No wheezing, rhonchi or rales.  Skin:    General: Skin is warm and dry.  Neurological:     Mental Status: She is alert.  Psychiatric:  Mood and Affect: Mood normal.        Thought Content: Thought content normal.     UC  Treatments / Results  Labs (all labs ordered are listed, but only abnormal results are displayed) Labs Reviewed  CULTURE, GROUP A STREP [facility])  POCT RAPID STREP A (OFFICE)    EKG   Radiology No results found.  Procedures Procedures (including critical care time)  Medications Ordered in UC Medications - No data to display  Initial Impression / Assessment and Plan / UC Course  I have reviewed the triage vital signs and the nursing notes.  Pertinent labs & imaging results that were available during my care of the patient were reviewed by me and considered in my medical decision making (see chart for details).  Strep test negative in office.  Will order throat culture for further evaluation.  Discussed likely viral etiology of symptoms and recommended symptomatic treatment.  Encouraged follow-up with any further concerns.  Final Clinical Impressions(s) / UC Diagnoses   Final diagnoses:  Acute pharyngitis, unspecified etiology   Discharge Instructions   None    ED Prescriptions   None    PDMP not reviewed this encounter.   Francene Finders, PA-C 01/11/21 1737

## 2021-01-11 NOTE — ED Triage Notes (Signed)
Pt c/o sore throat with pain on swallowing since Friday. Hx of strep throat.

## 2021-01-14 LAB — CULTURE, GROUP A STREP (THRC)

## 2021-01-22 ENCOUNTER — Other Ambulatory Visit: Payer: Self-pay

## 2021-01-22 ENCOUNTER — Ambulatory Visit
Admission: RE | Admit: 2021-01-22 | Discharge: 2021-01-22 | Disposition: A | Discharge status: Home / Self Care | Payer: Medicaid Other | Source: Ambulatory Visit | Attending: Physician Assistant | Admitting: Physician Assistant

## 2021-01-22 VITALS — BP 160/122 | HR 85 | Temp 98.2°F | Resp 16

## 2021-01-22 DIAGNOSIS — B3731 Acute candidiasis of vulva and vagina: Secondary | ICD-10-CM | POA: Insufficient documentation

## 2021-01-22 LAB — POCT URINALYSIS DIP (MANUAL ENTRY)
Bilirubin, UA: NEGATIVE
Glucose, UA: 500 mg/dL — AB
Ketones, POC UA: NEGATIVE mg/dL
Leukocytes, UA: NEGATIVE
Nitrite, UA: NEGATIVE
Protein Ur, POC: 30 mg/dL — AB
Spec Grav, UA: >=1.03 — AB (ref 1.010–1.025)
Urobilinogen, UA: 0.2 E.U./dL
pH, UA: 6 (ref 5.0–8.0)

## 2021-01-22 LAB — POCT FASTING CBG KUC MANUAL ENTRY: POCT Glucose (KUC): 255 mg/dL — AB (ref 70–99)

## 2021-01-22 MED ORDER — FLUCONAZOLE 150 MG PO TABS: 150.0000 mg | ORAL_TABLET | Freq: Every day | ORAL | 0 refills | Status: DC

## 2021-01-22 NOTE — ED Triage Notes (Addendum)
Vaginal discomfort over last 3 days. States recently she's been getting recurrent yeast infections, and now feels like it's spread to her rectal area. Reports both itching and pain.   Also states she feels like she's not peeing as much as usual. States it does burn when she urinates, and is also is concerned about the possibility of a UTI. Denies fever, lower abdominal pain. Is an uncontrolled DM II, does not own a glucometer, needs a PCP.

## 2021-01-22 NOTE — Discharge Instructions (Signed)
Return if any problems.

## 2021-01-25 LAB — CERVICOVAGINAL ANCILLARY ONLY
Candida Glabrata: NEGATIVE
Candida Vaginitis: POSITIVE — AB
Chlamydia: NEGATIVE
Comment: NEGATIVE
Comment: NEGATIVE
Comment: NEGATIVE
Comment: NEGATIVE
Comment: NORMAL
Neisseria Gonorrhea: NEGATIVE
Trichomonas: NEGATIVE

## 2021-01-25 NOTE — ED Provider Notes (Signed)
[facility]    CSN: [id number] Arrival date & time: 01/22/21  1220      History   Chief Complaint Chief Complaint  Patient presents with   Vaginitis   Dysuria    HPI Kayla Flynn is a 30 y.o. female.   Pt complains of feeling like she has a yeast infection   The history is provided by the patient. No language interpreter was used.  Dysuria Pain quality:  Aching Progression:  Worsening Recent urinary tract infections: no   Relieved by:  Nothing Ineffective treatments:  None tried Risk factors: no sexually transmitted infections    Past Medical History:  Diagnosis Date   Acute appendicitis 07/18/2012   Hypertension    Obesity    Obesity, morbid, BMI 50 or higher ([place]) 07/18/2012   Severe preeclampsia 12/10/2014   Type 2 diabetes mellitus [facility])     Patient Active Problem List   Diagnosis Date Noted   Hyperlipidemia 12/05/2019   Benign essential HTN 01/28/2015   History of syphilis 08/11/2014   Maternal varicella, non-immune 07/21/2014   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, antepartum 07/21/2014   S/P cesarean section 07/14/2014   Obesity, morbid, BMI 82 ([place]) 07/18/2012   DM type 2 (diabetes mellitus, type 2) ([place]) 10/12/2010    Past Surgical History:  Procedure Laterality Date   APPENDECTOMY  07/17/2012   CESAREAN SECTION N/A 12/18/2014   Procedure: CESAREAN SECTION;  Surgeon: Osborne Oman, MD;  Location: [facility];  Service: Obstetrics;  Laterality: N/A;   LAPAROSCOPIC APPENDECTOMY N/A 07/17/2012   Procedure: APPENDECTOMY LAPAROSCOPIC;  Surgeon: Zenovia Jarred, MD;  Location: [facility];  Service: General;  Laterality: N/A;    OB History     Gravida  1   Para  1   Term  1   Preterm      AB      Living  1      SAB      IAB      Ectopic      Multiple  0   Live Births  1            Home Medications    Prior to Admission medications   Medication Sig Start Date End Date Taking? Authorizing Provider   fluconazole (DIFLUCAN) 150 MG tablet Take 1 tablet (150 mg total) by mouth daily. 01/22/21  Yes Caryl Ada K, PA-C  Accu-Chek FastClix Lancets MISC Use to check FSBS twice a day. Dx: E11.65 Z79.4 12/05/19   Nicolette Bang, MD  baclofen (LIORESAL) 10 MG tablet Take 1 tablet (10 mg total) by mouth 2 (two) times daily as needed for muscle spasms. 10/16/20   Raspet, Derry Skill, PA-C  Blood Glucose Monitoring Suppl (ACCU-CHEK GUIDE ME) w/Device KIT Use to check FSBS twice a day. Dx: E11.65 Z79.4 12/05/19   Nicolette Bang, MD  glucose blood (ACCU-CHEK GUIDE) test strip Use to check FSBS twice a day. Dx: E11.65 Z79.4 12/05/19   Nicolette Bang, MD  ibuprofen (ADVIL) 600 MG tablet Take 1 tablet (600 mg total) by mouth every 6 (six) hours as needed for mild pain or moderate pain. 11/27/20   Teodora Medici, FNP  Insulin Pen Needle (B-D UF III MINI PEN NEEDLES) 31G X 5 MM MISC Use as instructed. Inject into the skin once daily  E11.65 Z79.4 02/12/19   Gildardo Pounds, NP  Lancets Misc. (ACCU-CHEK FASTCLIX LANCET) KIT Use to check FSBS twice a day. Dx: E11.65  Z79.4 12/05/19   Nicolette Bang, MD  liraglutide (VICTOZA) 18 MG/3ML SOPN Inject 1.8 mg into the skin daily. 12/05/19   Nicolette Bang, MD  lisinopril (ZESTRIL) 5 MG tablet Take 1 tablet (5 mg total) by mouth daily. 02/12/19   Gildardo Pounds, NP  metFORMIN (GLUCOPHAGE) 1000 MG tablet Take 1 tablet (1,000 mg total) by mouth 2 (two) times daily with a meal. Take one tablet (532m)  by mouth twice a day for 7 days. Increase to 2 tablets (1000 mg) by mouth twice a day the following week  E11.65 Z79.4 12/05/19   WNicolette Bang MD    Family History Family History  Problem Relation Age of Onset   Hypertension Mother    Diabetes Mother    Hypertension Maternal Grandmother     Social History Social History   Tobacco Use   Smoking status: Every Day    Packs/day: 0.25    Years: 1.20    Pack years:  0.30    Types: Cigarettes   Smokeless tobacco: Former    Quit date: 12/10/2014  Substance Use Topics   Alcohol use: No    Comment: rarely   Drug use: No     Allergies   Patient has no known allergies.   Review of Systems Review of Systems  Genitourinary:  Positive for dysuria.  All other systems reviewed and are negative.   Physical Exam Triage Vital Signs ED Triage Vitals  Enc Vitals Group     BP 01/22/21 1330 (!) 160/122     Pulse Rate 01/22/21 1330 85     Resp 01/22/21 1330 16     Temp 01/22/21 1330 98.2 F (36.8 C)     Temp Source 01/22/21 1330 Oral     SpO2 01/22/21 1330 97 %     Weight --      Height --      Head Circumference --      Peak Flow --      Pain Score 01/22/21 1334 9     Pain Loc --      Pain Edu? --      [place]    No data found.  Updated Vital Signs BP (!) 160/122 (BP Location: Left Arm)   Pulse 85   Temp 98.2 F (36.8 C) (Oral)   Resp 16   LMP 01/06/2021   SpO2 97%   Visual Acuity Right Eye Distance:   Left Eye Distance:   Bilateral Distance:    Right Eye Near:   Left Eye Near:    Bilateral Near:     Physical Exam Vitals and nursing note reviewed.  Constitutional:      Appearance: She is well-developed.  HENT:     Head: Normocephalic.  Cardiovascular:     Rate and Rhythm: Normal rate.  Pulmonary:     Effort: Pulmonary effort is normal.  Abdominal:     General: There is no distension.  Musculoskeletal:        General: Normal range of motion.     Cervical back: Normal range of motion.  Neurological:     Mental Status: She is alert and oriented to person, place, and time.     UC Treatments / Results  Labs (all labs ordered are listed, but only abnormal results are displayed) Labs Reviewed  POCT URINALYSIS DIP (MANUAL ENTRY) - Abnormal; Notable for the following components:      Result Value   Glucose, UA =500 (*)  Spec Grav, UA >=1.030 (*)    Blood, UA moderate (*)    Protein Ur, POC =30 (*)    All  other components within normal limits  POCT FASTING CBG [facility] MANUAL ENTRY - Abnormal; Notable for the following components:   POCT Glucose ([facility]) 255 (*)    All other components within normal limits  CERVICOVAGINAL ANCILLARY ONLY    EKG   Radiology No results found.  Procedures Procedures (including critical care time)  Medications Ordered in UC Medications - No data to display  Initial Impression / Assessment and Plan / UC Course  I have reviewed the triage vital signs and the nursing notes.  Pertinent labs & imaging results that were available during my care of the patient were reviewed by me and considered in my medical decision making (see chart for details).     MDM:  gc and ct wet prep ordered.  Pt given rx for diflucan Final Clinical Impressions(s) / UC Diagnoses   Final diagnoses:  Yeast vaginitis     Discharge Instructions      Return if any problems.     ED Prescriptions     Medication Sig Dispense Auth. Provider   fluconazole (DIFLUCAN) 150 MG tablet Take 1 tablet (150 mg total) by mouth daily. 3 tablet Fransico Meadow, [place]      PDMP not reviewed this encounter. An After Visit Summary was printed and given to the patient.  An After Visit Summary was printed and given to the patient.    Fransico Meadow, [place] 01/25/21 1008

## 2021-02-19 ENCOUNTER — Ambulatory Visit
Admission: EM | Admit: 2021-02-19 | Discharge: 2021-02-19 | Disposition: A | Discharge status: Home / Self Care | Payer: Medicaid Other | Attending: Internal Medicine | Admitting: Internal Medicine

## 2021-02-19 ENCOUNTER — Other Ambulatory Visit: Payer: Self-pay

## 2021-02-19 DIAGNOSIS — N898 Other specified noninflammatory disorders of vagina: Secondary | ICD-10-CM | POA: Insufficient documentation

## 2021-02-19 DIAGNOSIS — B3731 Acute candidiasis of vulva and vagina: Secondary | ICD-10-CM | POA: Insufficient documentation

## 2021-02-19 DIAGNOSIS — Z113 Encounter for screening for infections with a predominantly sexual mode of transmission: Secondary | ICD-10-CM | POA: Insufficient documentation

## 2021-02-19 MED ORDER — FLUCONAZOLE 150 MG PO TABS: 150.0000 mg | ORAL_TABLET | ORAL | 0 refills | Status: DC

## 2021-02-19 NOTE — ED Triage Notes (Addendum)
2 day h/o vaginal discharge and onset last night of vaginal discharge. No meds taken. No urinary sxs.

## 2021-02-19 NOTE — Discharge Instructions (Signed)
Diflucan has been sent to treat possible yeast infection.  Your vaginal swab is pending.

## 2021-02-19 NOTE — ED Provider Notes (Signed)
[name]    CSN: [id number] Arrival date & time: 02/19/21  1502      History   Chief Complaint Chief Complaint  Patient presents with   Vaginal Discharge    HPI Kayla Flynn is a 30 y.o. female.   Patient presents with thick, white vaginal discharge and vaginal itching that started approximately 2 days ago.  Patient reports that she has recurrent yeast infections due to her diabetes.  She last had a yeast infection in November and was treated successfully with Diflucan.  She denies any urinary burning, urinary frequency, hematuria, irregular vaginal bleeding, back pain, fever, abdominal pain.   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, antepartum 07/21/2014   S/P cesarean section 07/14/2014   Obesity, morbid, BMI 82 ([place]) 07/18/2012   DM type 2 (diabetes mellitus, type 2) ([place]) 10/12/2010    Past Surgical History:  Procedure Laterality Date   APPENDECTOMY  07/17/2012   CESAREAN SECTION N/A 12/18/2014   Procedure: CESAREAN SECTION;  Surgeon: Osborne Oman, MD;  Location: [facility] [place] [facility];  Service: Obstetrics;  Laterality: N/A;   LAPAROSCOPIC APPENDECTOMY N/A 07/17/2012   Procedure: APPENDECTOMY LAPAROSCOPIC;  Surgeon: Zenovia Jarred, MD;  Location: [facility];  Service: General;  Laterality: N/A;    OB History     Gravida  1   Para  1   Term  1   Preterm      AB      Living  1      SAB      IAB      Ectopic      Multiple  0   Live Births  1            Home Medications    Prior to Admission  medications   Medication Sig Start Date End Date Taking? Authorizing Provider  fluconazole (DIFLUCAN) 150 MG tablet Take 1 tablet (150 mg total) by mouth every 3 (three) days. Take first pill today.  If no resolution of symptoms with first pill, you may take second pill in 3 days.  If no resolution of symptoms with second pill, you may take third pill 3 days after second pill. 02/19/21  Yes Calliope Delangel, Michele Rockers, FNP  Accu-Chek FastClix Lancets MISC Use to check FSBS twice a day. Dx: E11.65 Z79.4 12/05/19   Nicolette Bang, MD  baclofen (LIORESAL) 10 MG tablet Take 1 tablet (10 mg total) by mouth 2 (two) times daily as needed for muscle spasms. 10/16/20   Raspet, Derry Skill, PA-C  Blood Glucose Monitoring Suppl (ACCU-CHEK GUIDE ME) w/Device KIT Use to check FSBS twice a day. Dx: E11.65 Z79.4 12/05/19   Nicolette Bang, MD  glucose blood (ACCU-CHEK GUIDE) test strip Use to check FSBS twice a day. Dx: E11.65 Z79.4 12/05/19   Nicolette Bang, MD  ibuprofen (ADVIL) 600 MG tablet Take 1 tablet (600 mg total) by mouth every 6 (six) hours as needed for mild pain or moderate pain. 11/27/20   Teodora Medici, FNP  Insulin Pen Needle (B-D UF III MINI PEN NEEDLES) 31G X 5 MM MISC Use as instructed. Inject into the skin once daily  E11.65 Z79.4 02/12/19   Gildardo Pounds, NP  Lancets Misc. (ACCU-CHEK FASTCLIX LANCET) KIT Use to check FSBS twice a day. Dx: E11.65 Z79.4 12/05/19   Nicolette Bang, MD  liraglutide (VICTOZA) 18 MG/3ML SOPN Inject 1.8 mg into the skin daily. 12/05/19   Nicolette Bang, MD  lisinopril (ZESTRIL) 5 MG tablet Take 1 tablet (5 mg total) by mouth daily. 02/12/19   Gildardo Pounds, NP  metFORMIN (GLUCOPHAGE) 1000 MG tablet Take 1 tablet (1,000 mg total) by mouth 2 (two) times daily with a meal. Take one tablet (567m)  by mouth twice a day for 7 days. Increase to 2 tablets (1000 mg) by mouth twice a day the following week  E11.65 Z79.4 12/05/19   WNicolette Bang MD    Family History Family History  Problem Relation Age of Onset   Hypertension Mother    Diabetes Mother    Hypertension Maternal Grandmother     Social History Social History   Tobacco Use   Smoking status: Every Day    Packs/day: 0.25    Years: 1.20    Pack years: 0.30    Types: Cigarettes   Smokeless tobacco: Former    Quit date: 12/10/2014  Substance Use Topics   Alcohol use: No    Comment: rarely   Drug use: No     Allergies   Patient has no known allergies.   Review of Systems Review of Systems Per HPI  Physical Exam Triage Vital Signs ED Triage Vitals  Enc Vitals Group     BP 02/19/21 1638 (!) 165/90     Pulse Rate 02/19/21 1638 90     Resp 02/19/21 1638 18     Temp 02/19/21 1638 98.3 F (36.8 C)     Temp Source 02/19/21 1638 Oral     SpO2 02/19/21 1638 96 %     Weight --      Height --      Head Circumference --      Peak Flow --      Pain Score 02/19/21 1641 0     Pain Loc --      Pain Edu? --      [place]    No data found.  Updated Vital Signs BP (!) 165/90 (BP Location: Left Arm)    Pulse 90    Temp 98.3 F (36.8 C) (Oral)    Resp 18    LMP 01/05/2021 (Exact Date)    SpO2 96%   Visual Acuity Right Eye Distance:   Left Eye Distance:   Bilateral Distance:    Right Eye Near:   Left Eye Near:    Bilateral Near:     Physical Exam Constitutional:      General: She is not in acute distress.    Appearance: Normal appearance. She is not toxic-appearing or diaphoretic.  HENT:     Head: Normocephalic and atraumatic.  Eyes:     Extraocular Movements: Extraocular movements intact.     Conjunctiva/sclera: Conjunctivae normal.  Pulmonary:     Effort: Pulmonary effort is normal.  Genitourinary:    Comments: Deferred with shared decision making.  Self swab performed. Neurological:     General: No focal deficit present.     Mental Status: She is alert and oriented to person, place, and time. Mental  status is at  baseline.  Psychiatric:        Mood and Affect: Mood normal.        Behavior: Behavior normal.        Thought Content: Thought content normal.        Judgment: Judgment normal.     UC Treatments / Results  Labs (all labs ordered are listed, but only abnormal results are displayed) Labs Reviewed  CERVICOVAGINAL ANCILLARY ONLY    EKG   Radiology No results found.  Procedures Procedures (including critical care time)  Medications Ordered in UC Medications - No data to display  Initial Impression / Assessment and Plan / UC Course  I have reviewed the triage vital signs and the nursing notes.  Pertinent labs & imaging results that were available during my care of the patient were reviewed by me and considered in my medical decision making (see chart for details).     Patient's symptoms seem consistent with vaginal yeast infection.  Will treat with Diflucan.  Vaginal swab pending.  Will change treatment if needed once vaginal swab results are complete.  Patient to refrain from sexual activity until test results and treatment are complete.  Discussed strict return precautions.  Patient verbalized understanding and was agreeable with plan. Final Clinical Impressions(s) / UC Diagnoses   Final diagnoses:  Vaginal yeast infection  Screening examination for venereal disease  Vaginal discharge     Discharge Instructions      Diflucan has been sent to treat possible yeast infection.  Your vaginal swab is pending.    ED Prescriptions     Medication Sig Dispense Auth. Provider   fluconazole (DIFLUCAN) 150 MG tablet Take 1 tablet (150 mg total) by mouth every 3 (three) days. Take first pill today.  If no resolution of symptoms with first pill, you may take second pill in 3 days.  If no resolution of symptoms with second pill, you may take third pill 3 days after second pill. 3 tablet [place], Michele Rockers, [place]      PDMP not reviewed this encounter.   Teodora Medici, [place] 02/19/21  [phone]

## 2021-02-22 ENCOUNTER — Telehealth (HOSPITAL_COMMUNITY): Payer: Self-pay | Admitting: Emergency Medicine

## 2021-02-22 LAB — CERVICOVAGINAL ANCILLARY ONLY
Bacterial Vaginitis (gardnerella): POSITIVE — AB
Candida Glabrata: NEGATIVE
Candida Vaginitis: POSITIVE — AB
Chlamydia: NEGATIVE
Comment: NEGATIVE
Comment: NEGATIVE
Comment: NEGATIVE
Comment: NEGATIVE
Comment: NEGATIVE
Comment: NORMAL
Neisseria Gonorrhea: NEGATIVE
Trichomonas: NEGATIVE

## 2021-02-22 MED ORDER — METRONIDAZOLE 500 MG PO TABS: 500.0000 mg | ORAL_TABLET | Freq: Two times a day (BID) | ORAL | 0 refills | Status: DC

## 2021-03-10 ENCOUNTER — Ambulatory Visit
Admission: EM | Admit: 2021-03-10 | Discharge: 2021-03-10 | Disposition: A | Discharge status: Home / Self Care | Payer: Medicaid Other | Attending: Internal Medicine | Admitting: Internal Medicine

## 2021-03-10 ENCOUNTER — Other Ambulatory Visit: Payer: Self-pay

## 2021-03-10 DIAGNOSIS — K047 Periapical abscess without sinus: Secondary | ICD-10-CM

## 2021-03-10 DIAGNOSIS — K0889 Other specified disorders of teeth and supporting structures: Secondary | ICD-10-CM

## 2021-03-10 MED ORDER — AMOXICILLIN-POT CLAVULANATE 875-125 MG PO TABS: 1.0000 | ORAL_TABLET | Freq: Two times a day (BID) | ORAL | 0 refills | Status: DC

## 2021-03-10 MED ORDER — LIDOCAINE VISCOUS HCL 2 % MT SOLN: 15.0000 mL | OROMUCOSAL | 0 refills | Status: DC | PRN

## 2021-03-10 NOTE — Discharge Instructions (Signed)
You have been prescribed an antibiotic and lidocaine solution for dental pain and infection.  Please follow-up with dentist soon as possible for further evaluation and management.

## 2021-03-10 NOTE — ED Triage Notes (Signed)
Onset yesterday morning of bottom left tooth pain. Has been taking tylenol without relief. Notes pain with chewing. Pt has not been able to contact as dentist yet.

## 2021-03-10 NOTE — ED Provider Notes (Signed)
[name]    CSN: [id number] Arrival date & time: 03/10/21  1618      History   Chief Complaint Chief Complaint  Patient presents with   Dental Pain    HPI Kayla Flynn is a 31 y.o. female.   Patient presents with left lower tooth pain that started yesterday.  Denies any apparent injury to the area or any drainage.  Denies any fevers.  Patient reports that she is trying to get a hold of a dentist but is having difficulty due to insurance issues.  Denies fevers or swelling.   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, antepartum 07/21/2014   S/P cesarean section 07/14/2014   Obesity, morbid, BMI 82 ([place]) 07/18/2012   DM type 2 (diabetes mellitus, type 2) ([place]) 10/12/2010    Past Surgical History:  Procedure Laterality Date   APPENDECTOMY  07/17/2012   CESAREAN SECTION N/A 12/18/2014   Procedure: CESAREAN SECTION;  Surgeon: Osborne Oman, MD;  Location: [facility];  Service: Obstetrics;  Laterality: N/A;   LAPAROSCOPIC APPENDECTOMY N/A 07/17/2012   Procedure: APPENDECTOMY LAPAROSCOPIC;  Surgeon: Zenovia Jarred, MD;  Location: [facility];  Service: General;  Laterality: N/A;    OB History     Gravida  1   Para  1   Term  1   Preterm      AB      Living  1      SAB      IAB      Ectopic      Multiple  0   Live Births  1            Home Medications    Prior to Admission medications   Medication Sig Start Date End Date Taking? Authorizing Provider  amoxicillin-clavulanate (AUGMENTIN) 875-125 MG  tablet Take 1 tablet by mouth every 12 (twelve) hours. 03/10/21  Yes Lavera Vandermeer, Hildred Alamin E, FNP  lidocaine (XYLOCAINE) 2 % solution Use as directed 15 mLs in the mouth or throat as needed for mouth pain. Swish and spit 03/10/21  Yes Sarinah Doetsch, Michele Rockers, FNP  Accu-Chek [employer] Use to check FSBS twice a day. Dx: E11.65 Z79.4 12/05/19   Nicolette Bang, MD  baclofen (LIORESAL) 10 MG tablet Take 1 tablet (10 mg total) by mouth 2 (two) times daily as needed for muscle spasms. 10/16/20   Raspet, Derry Skill, PA-C  Blood Glucose Monitoring Suppl (ACCU-CHEK GUIDE ME) w/Device KIT Use to check FSBS twice a day. Dx: E11.65 Z79.4 12/05/19   Nicolette Bang, MD  fluconazole (DIFLUCAN) 150 MG tablet Take 1 tablet (150 mg total) by mouth every 3 (three) days. Take first pill today.  If no resolution of symptoms with first pill, you may take second pill in 3 days.  If no resolution of symptoms with second pill, you may take third pill 3 days after second pill. 02/19/21   Teodora Medici, FNP  glucose blood (ACCU-CHEK GUIDE) test strip Use to check FSBS twice a day.  Dx: E11.65 Z79.4 12/05/19   Nicolette Bang, MD  ibuprofen (ADVIL) 600 MG tablet Take 1 tablet (600 mg total) by mouth every 6 (six) hours as needed for mild pain or moderate pain. 11/27/20   Teodora Medici, FNP  Insulin Pen Needle (B-D UF III MINI PEN NEEDLES) 31G X 5 MM MISC Use as instructed. Inject into the skin once daily  E11.65 Z79.4 02/12/19   Gildardo Pounds, NP  Lancets Misc. (ACCU-CHEK FASTCLIX LANCET) KIT Use to check FSBS twice a day. Dx: E11.65 Z79.4 12/05/19   Nicolette Bang, MD  liraglutide (VICTOZA) 18 MG/3ML SOPN Inject 1.8 mg into the skin daily. 12/05/19   Nicolette Bang, MD  lisinopril (ZESTRIL) 5 MG tablet Take 1 tablet (5 mg total) by mouth daily. 02/12/19   Gildardo Pounds, NP  metFORMIN (GLUCOPHAGE) 1000 MG tablet Take 1 tablet (1,000 mg total) by mouth 2 (two) times daily with a meal. Take one  tablet (582m)  by mouth twice a day for 7 days. Increase to 2 tablets (1000 mg) by mouth twice a day the following week  E11.65 Z79.4 12/05/19   WNicolette Bang MD  metroNIDAZOLE (FLAGYL) 500 MG tablet Take 1 tablet (500 mg total) by mouth 2 (two) times daily. 02/22/21   Lamptey,Myrene Galas MD    Family History Family History  Problem Relation Age of Onset   Hypertension Mother    Diabetes Mother    Hypertension Maternal Grandmother     Social History Social History   Tobacco Use   Smoking status: Every Day    Packs/day: 0.25    Years: 1.20    Pack years: 0.30    Types: Cigarettes   Smokeless tobacco: Former    Quit date: 12/10/2014  Substance Use Topics   Alcohol use: No    Comment: rarely   Drug use: No     Allergies   Patient has no known allergies.   Review of Systems Review of Systems Per HPI  Physical Exam Triage Vital Signs ED Triage Vitals  Enc Vitals Group     BP 03/10/21 1708 (!) 169/121     Pulse Rate 03/10/21 1708 98     Resp 03/10/21 1708 18     Temp 03/10/21 1708 98.3 F (36.8 C)     Temp Source 03/10/21 1708 Oral     SpO2 03/10/21 1708 95 %     Weight --      Height --      Head Circumference --      Peak Flow --      Pain Score 03/10/21 1710 8     Pain Loc --      Pain Edu? --      Excl. in [place] --    No data found.  Updated Vital Signs BP (!) 169/121 (BP Location: Left Arm)    Pulse 98    Temp 98.3 F (36.8 C) (Oral)    Resp 18    SpO2 95%   Visual Acuity Right Eye Distance:   Left Eye Distance:   Bilateral Distance:    Right Eye Near:   Left Eye Near:    Bilateral Near:     Physical Exam Constitutional:      General: She is not in acute distress.    Appearance: Normal appearance. She is not toxic-appearing or diaphoretic.  HENT:     Head: Normocephalic and atraumatic.     Mouth/Throat:  Lips: Pink.     Mouth: Mucous membranes are moist.     Dentition: Abnormal dentition. Dental tenderness and gingival  swelling present. No dental abscesses.     Comments: Patient has erythema and gingival swelling located to left lower back tooth.  No obvious abscess.  No drainage noted. Eyes:     Extraocular Movements: Extraocular movements intact.     Conjunctiva/sclera: Conjunctivae normal.  Pulmonary:     Effort: Pulmonary effort is normal.  Neurological:     General: No focal deficit present.     Mental Status: She is alert and oriented to person, place, and time. Mental status is at baseline.  Psychiatric:        Mood and Affect: Mood normal.        Behavior: Behavior normal.        Thought Content: Thought content normal.        Judgment: Judgment normal.     UC Treatments / Results  Labs (all labs ordered are listed, but only abnormal results are displayed) Labs Reviewed - No data to display  EKG   Radiology No results found.  Procedures Procedures (including critical care time)  Medications Ordered in UC Medications - No data to display  Initial Impression / Assessment and Plan / UC Course  I have reviewed the triage vital signs and the nursing notes.  Pertinent labs & imaging results that were available during my care of the patient were reviewed by me and considered in my medical decision making (see chart for details).     Will treat dental infection with Augmentin antibiotic.  Viscous lidocaine also prescribed to help alleviate patient's pain.  Discussed supportive care and pain management with patient.  Advised patient to follow-up with a dentist for further evaluation and management.  Patient verbalized understanding and was agreeable with plan. Final Clinical Impressions(s) / UC Diagnoses   Final diagnoses:  Dental infection  Pain, dental     Discharge Instructions      You have been prescribed an antibiotic and lidocaine solution for dental pain and infection.  Please follow-up with dentist soon as possible for further evaluation and management.    ED  Prescriptions     Medication Sig Dispense Auth. Provider   amoxicillin-clavulanate (AUGMENTIN) 875-125 MG tablet Take 1 tablet by mouth every 12 (twelve) hours. 14 tablet [place], [place] E, [place]   lidocaine (XYLOCAINE) 2 % solution Use as directed 15 mLs in the mouth or throat as needed for mouth pain. Swish and spit 100 mL Teodora Medici, Luis M. [place]      PDMP not reviewed this encounter.   Teodora Medici, [place] 03/10/21 [phone]

## 2021-03-17 ENCOUNTER — Other Ambulatory Visit: Payer: Self-pay

## 2021-03-17 ENCOUNTER — Ambulatory Visit
Admission: EM | Admit: 2021-03-17 | Discharge: 2021-03-17 | Disposition: A | Discharge status: Home / Self Care | Payer: Medicaid Other | Attending: Physician Assistant | Admitting: Physician Assistant

## 2021-03-17 DIAGNOSIS — R1030 Lower abdominal pain, unspecified: Secondary | ICD-10-CM | POA: Diagnosis not present

## 2021-03-17 DIAGNOSIS — N760 Acute vaginitis: Secondary | ICD-10-CM | POA: Insufficient documentation

## 2021-03-17 LAB — POCT URINALYSIS DIP (MANUAL ENTRY)
Bilirubin, UA: NEGATIVE
Blood, UA: NEGATIVE
Glucose, UA: 500 mg/dL — AB
Leukocytes, UA: NEGATIVE
Nitrite, UA: NEGATIVE
Protein Ur, POC: 100 mg/dL — AB
Spec Grav, UA: >=1.03 — AB (ref 1.010–1.025)
Urobilinogen, UA: 0.2 E.U./dL
pH, UA: 5.5 (ref 5.0–8.0)

## 2021-03-17 MED ORDER — FLUCONAZOLE 150 MG PO TABS: 150.0000 mg | ORAL_TABLET | Freq: Once | ORAL | 0 refills | Status: AC

## 2021-03-17 NOTE — ED Triage Notes (Signed)
Pt c/o sharp pain in abd that radiates to vaginal with lower back pain onset yesterday. Associated vaginal dc. States tested(+) for BV and yeast, tx with diflucan and flagyl states no symptom resolution after tx.

## 2021-03-17 NOTE — ED Provider Notes (Signed)
[name]    CSN: [id number] Arrival date & time: 03/17/21  1531      History   Chief Complaint Chief Complaint  Patient presents with   Abdominal Pain    HPI Kayla Flynn is a 31 y.o. female.   Here today for evaluation of vaginal discharge, sharp abdominal pain that radiates into her vaginal area.  She states she was recently treated for yeast and BV but states symptoms have not fully cleared with treatment.  She was recently on antibiotic therapy for dental pain.   The history is provided by the patient.  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, antepartum 07/21/2014   S/P cesarean section 07/14/2014   Obesity, morbid, BMI 82 ([place]) 07/18/2012   DM type 2 (diabetes mellitus, type 2) ([place]) 10/12/2010    Past Surgical History:  Procedure Laterality Date   APPENDECTOMY  07/17/2012   CESAREAN SECTION N/A 12/18/2014   Procedure: CESAREAN SECTION;  Surgeon: Osborne Oman, MD;  Location: [place] [facility];  Service: Obstetrics;  Laterality: N/A;   LAPAROSCOPIC APPENDECTOMY N/A 07/17/2012   Procedure: APPENDECTOMY LAPAROSCOPIC;  Surgeon: Zenovia Jarred, MD;  Location: [facility];  Service: General;  Laterality: N/A;    OB History     Gravida  1   Para  1   Term  1   Preterm      AB      Living  1      SAB      IAB      Ectopic      Multiple  0   Live Births  1            Home  Medications    Prior to Admission medications   Medication Sig Start Date End Date Taking? Authorizing Provider  fluconazole (DIFLUCAN) 150 MG tablet Take 1 tablet (150 mg total) by mouth once for 1 dose. 03/17/21 03/17/21 Yes Francene Finders, PA-C  Accu-Chek FastClix Lancets MISC Use to check FSBS twice a day. Dx: E11.65 Z79.4 12/05/19   Nicolette Bang, MD  amoxicillin-clavulanate (AUGMENTIN) 875-125 MG tablet Take 1 tablet by mouth every 12 (twelve) hours. 03/10/21   Teodora Medici, FNP  baclofen (LIORESAL) 10 MG tablet Take 1 tablet (10 mg total) by mouth 2 (two) times daily as needed for muscle spasms. 10/16/20   Raspet, Derry Skill, PA-C  Blood Glucose Monitoring Suppl (ACCU-CHEK GUIDE ME) w/Device KIT Use to check FSBS twice a day. Dx: E11.65 Z79.4 12/05/19   Nicolette Bang, MD  glucose blood (ACCU-CHEK GUIDE) test strip Use to check FSBS twice a day. Dx: E11.65 Z79.4 12/05/19   Nicolette Bang, MD  ibuprofen (ADVIL) 600 MG tablet Take 1 tablet (600 mg total) by mouth every 6 (six) hours as needed for mild pain or moderate pain. 11/27/20   Teodora Medici, FNP  Insulin Pen Needle (  B-D UF III MINI PEN NEEDLES) 31G X 5 MM MISC Use as instructed. Inject into the skin once daily  E11.65 Z79.4 02/12/19   Gildardo Pounds, NP  Lancets Misc. (ACCU-CHEK FASTCLIX LANCET) KIT Use to check FSBS twice a day. Dx: E11.65 Z79.4 12/05/19   Nicolette Bang, MD  lidocaine (XYLOCAINE) 2 % solution Use as directed 15 mLs in the mouth or throat as needed for mouth pain. Swish and spit 03/10/21   [place], Michele Rockers, FNP  liraglutide (VICTOZA) 18 MG/3ML SOPN Inject 1.8 mg into the skin daily. 12/05/19   Nicolette Bang, MD  lisinopril (ZESTRIL) 5 MG tablet Take 1 tablet (5 mg total) by mouth daily. 02/12/19   Gildardo Pounds, NP  metFORMIN (GLUCOPHAGE) 1000 MG tablet Take 1 tablet (1,000 mg total) by mouth 2 (two) times daily with a meal. Take one tablet (568m)  by mouth twice a day for  7 days. Increase to 2 tablets (1000 mg) by mouth twice a day the following week  E11.65 Z79.4 12/05/19   WNicolette Bang MD  metroNIDAZOLE (FLAGYL) 500 MG tablet Take 1 tablet (500 mg total) by mouth 2 (two) times daily. 02/22/21   Lamptey,Myrene Galas MD    Family History Family History  Problem Relation Age of Onset   Hypertension Mother    Diabetes Mother    Hypertension Maternal Grandmother     Social History Social History   Tobacco Use   Smoking status: Every Day    Packs/day: 0.25    Years: 1.20    Pack years: 0.30    Types: Cigarettes   Smokeless tobacco: Former    Quit date: 12/10/2014  Substance Use Topics   Alcohol use: No    Comment: rarely   Drug use: No     Allergies   Patient has no known allergies.   Review of Systems Review of Systems  Constitutional:  Negative for chills and fever.  Respiratory:  Negative for shortness of breath.   Cardiovascular:  Negative for chest pain.  Gastrointestinal:  Positive for abdominal pain. Negative for nausea and vomiting.  Genitourinary:  Negative for dysuria and frequency.  Musculoskeletal:  Negative for back pain.    Physical Exam Triage Vital Signs ED Triage Vitals  Enc Vitals Group     BP 03/17/21 1600 (!) 174/119     Pulse Rate 03/17/21 1600 80     Resp 03/17/21 1600 18     Temp 03/17/21 1600 98.2 F (36.8 C)     Temp Source 03/17/21 1600 Oral     SpO2 03/17/21 1600 98 %     Weight --      Height --      Head Circumference --      Peak Flow --      Pain Score 03/17/21 1601 0     Pain Loc --      Pain Edu? --      [place]cl[place] in [place] --    No data found.  Updated Vital Signs BP (!) 174/119 (BP Location: Left Arm)    Pulse 80    Temp 98.2 F (36.8 C) (Oral)    Resp 18    SpO2 98%     Physical Exam Vitals and nursing note reviewed.  Constitutional:      General: She is not in acute distress.    Appearance: Normal appearance. She is not ill-appearing.  HENT:     Head: Normocephalic and  atraumatic.  Nose: Nose normal.  Cardiovascular:     Rate and Rhythm: Normal rate.  Pulmonary:     Effort: Pulmonary effort is normal. No respiratory distress.  Skin:    General: Skin is warm and dry.  Neurological:     Mental Status: She is alert.  Psychiatric:        Mood and Affect: Mood normal.        Thought Content: Thought content normal.     UC Treatments / Results  Labs (all labs ordered are listed, but only abnormal results are displayed) Labs Reviewed  POCT URINALYSIS DIP (MANUAL ENTRY) - Abnormal; Notable for the following components:      Result Value   Glucose, UA =500 (*)    Ketones, POC UA trace (5) (*)    Spec Grav, UA >=1.030 (*)    Protein Ur, POC =100 (*)    All other components within normal limits  CERVICOVAGINAL ANCILLARY ONLY    EKG   Radiology No results found.  Procedures Procedures (including critical care time)  Medications Ordered in UC Medications - No data to display  Initial Impression / Assessment and Plan / UC Course  I have reviewed the triage vital signs and the nursing notes.  Pertinent labs & imaging results that were available during my care of the patient were reviewed by me and considered in my medical decision making (see chart for details).   Diflucan prescribed for yeast coverage given recent antibiotic therapy. STD screening ordered. Recommend follow up if symptoms do not improve or worsen.   Final Clinical Impressions(s) / UC Diagnoses   Final diagnoses:  Lower abdominal pain  Acute vaginitis   Discharge Instructions   None    ED Prescriptions     Medication Sig Dispense Auth. Provider   fluconazole (DIFLUCAN) 150 MG tablet Take 1 tablet (150 mg total) by mouth once for 1 dose. 1 tablet Francene Finders, PA-C      PDMP not reviewed this encounter.   Francene Finders, PA-C 03/17/21 1735

## 2021-03-18 ENCOUNTER — Telehealth (HOSPITAL_COMMUNITY): Payer: Self-pay | Admitting: Emergency Medicine

## 2021-03-18 LAB — CERVICOVAGINAL ANCILLARY ONLY
Bacterial Vaginitis (gardnerella): POSITIVE — AB
Candida Glabrata: NEGATIVE
Candida Vaginitis: POSITIVE — AB
Chlamydia: NEGATIVE
Comment: NEGATIVE
Comment: NEGATIVE
Comment: NEGATIVE
Comment: NEGATIVE
Comment: NEGATIVE
Comment: NORMAL
Neisseria Gonorrhea: NEGATIVE
Trichomonas: NEGATIVE

## 2021-03-18 MED ORDER — METRONIDAZOLE 500 MG PO TABS: 500.0000 mg | ORAL_TABLET | Freq: Two times a day (BID) | ORAL | 0 refills | Status: DC

## 2021-03-18 MED ORDER — FLUCONAZOLE 150 MG PO TABS: 150.0000 mg | ORAL_TABLET | Freq: Once | ORAL | 0 refills | Status: AC

## 2021-04-06 ENCOUNTER — Encounter: Payer: Self-pay | Admitting: Emergency Medicine

## 2021-04-06 ENCOUNTER — Other Ambulatory Visit: Payer: Self-pay

## 2021-04-06 ENCOUNTER — Ambulatory Visit
Admission: EM | Admit: 2021-04-06 | Discharge: 2021-04-06 | Disposition: A | Discharge status: Home / Self Care | Payer: Medicaid Other | Attending: Urgent Care | Admitting: Urgent Care

## 2021-04-06 DIAGNOSIS — F17200 Nicotine dependence, unspecified, uncomplicated: Secondary | ICD-10-CM

## 2021-04-06 DIAGNOSIS — R0981 Nasal congestion: Secondary | ICD-10-CM

## 2021-04-06 DIAGNOSIS — H9203 Otalgia, bilateral: Secondary | ICD-10-CM

## 2021-04-06 DIAGNOSIS — Z794 Long term (current) use of insulin: Secondary | ICD-10-CM

## 2021-04-06 DIAGNOSIS — E119 Type 2 diabetes mellitus without complications: Secondary | ICD-10-CM

## 2021-04-06 DIAGNOSIS — J069 Acute upper respiratory infection, unspecified: Secondary | ICD-10-CM | POA: Diagnosis not present

## 2021-04-06 MED ORDER — PROMETHAZINE-DM 6.25-15 MG/5ML PO SYRP: 5.0000 mL | ORAL_SOLUTION | Freq: Every evening | ORAL | 0 refills | Status: DC | PRN

## 2021-04-06 MED ORDER — BENZONATATE 100 MG PO CAPS: 100.0000 mg | ORAL_CAPSULE | Freq: Three times a day (TID) | ORAL | 0 refills | Status: DC | PRN

## 2021-04-06 MED ORDER — LEVOCETIRIZINE DIHYDROCHLORIDE 5 MG PO TABS: 5.0000 mg | ORAL_TABLET | Freq: Every evening | ORAL | 0 refills | Status: DC

## 2021-04-06 MED ORDER — IPRATROPIUM BROMIDE 0.03 % NA SOLN: 2.0000 | Freq: Two times a day (BID) | NASAL | 0 refills | Status: DC

## 2021-04-06 NOTE — Discharge Instructions (Addendum)
We will notify you of your test results as they arrive and may take between 48-72 hours.  I encourage you to sign up for MyChart if you have not already done so as this can be the easiest way for us to communicate results to you online or through a phone app.  Generally, we only contact you if it is a positive test result.  In the meantime, if you develop worsening symptoms including fever, chest pain, shortness of breath despite our current treatment plan then please report to the emergency room as this may be a sign of worsening status from possible viral infection.  Otherwise, we will manage this as a viral syndrome. For sore throat or cough try using a honey-based tea. Use 3 teaspoons of honey with juice squeezed from half lemon. Place shaved pieces of ginger into 1/2-1 cup of water and warm over stove top. Then mix the ingredients and repeat every 4 hours as needed. Please take Tylenol 500mg-650mg every 6 hours for aches and pains, fevers. Hydrate very well with at least 2 liters of water. Eat light meals such as soups to replenish electrolytes and soft fruits, veggies. Start an antihistamine like Zyrtec for postnasal drainage, sinus congestion.   

## 2021-04-06 NOTE — ED Triage Notes (Signed)
Patient c/o bilateral ear pain, mainly left ear pain x 2 days, sinus pain and pressure.  Patient has been taken Tylenol and Excedrin w/o much relief.

## 2021-04-06 NOTE — ED Provider Notes (Signed)
[name]   MRN: [id number] DOB: Mar 28, 1990  Subjective:   Kayla Flynn is a 31 y.o. female presenting for 2-day history of recurrent bilateral ear pain, ear pressure worse on the left.  She is also had some sinus pain and sinus pressure, postnasal drainage, throat pain and coughing.  The ear pain can cause some dizziness at times.  Has a history of allergic rhinitis but is not taking her medications the way she should.  Patient has type 2 diabetes treated with insulin, is uncontrolled.  Took Augmentin for a dental infection from 03/10/2021. Has a history of strep as a child, ear wax build up as well.   No current facility-administered medications for this encounter.  Current Outpatient Medications:    Accu-Chek FastClix Lancets MISC, Use to check FSBS twice a day. Dx: E11.65 Z79.4, Disp: 100 each, Rfl: 4   baclofen (LIORESAL) 10 MG tablet, Take 1 tablet (10 mg total) by mouth 2 (two) times daily as needed for muscle spasms., Disp: 20 tablet, Rfl: 0   Blood Glucose Monitoring Suppl (ACCU-CHEK GUIDE ME) w/Device KIT, Use to check FSBS twice a day. Dx: E11.65 Z79.4, Disp: 1 kit, Rfl: 0   glucose blood (ACCU-CHEK GUIDE) test strip, Use to check FSBS twice a day. Dx: E11.65 Z79.4, Disp: 100 each, Rfl: 4   ibuprofen (ADVIL) 600 MG tablet, Take 1 tablet (600 mg total) by mouth every 6 (six) hours as needed for mild pain or moderate pain., Disp: 30 tablet, Rfl: 0   Insulin Pen Needle (B-D UF III MINI PEN NEEDLES) 31G X 5 MM MISC, Use as instructed. Inject into the skin once daily  E11.65 Z79.4, Disp: 90 each, Rfl: 3   Lancets Misc. (ACCU-CHEK FASTCLIX LANCET) KIT, Use to check FSBS twice a day. Dx: E11.65 Z79.4, Disp: 1 kit, Rfl: 0   lidocaine (XYLOCAINE) 2 % solution, Use as directed 15 mLs in the mouth or throat as needed for mouth pain. Swish and spit, Disp: 100 mL, Rfl: 0   liraglutide (VICTOZA) 18 MG/3ML SOPN, Inject 1.8 mg into the skin daily., Disp: 15 mL, Rfl: 2   lisinopril  (ZESTRIL) 5 MG tablet, Take 1 tablet (5 mg total) by mouth daily., Disp: 90 tablet, Rfl: 3   metFORMIN (GLUCOPHAGE) 1000 MG tablet, Take 1 tablet (1,000 mg total) by mouth 2 (two) times daily with a meal. Take one tablet (557m)  by mouth twice a day for 7 days. Increase to 2 tablets (1000 mg) by mouth twice a day the following week  E11.65 Z79.4, Disp: 180 tablet, Rfl: 1   metroNIDAZOLE (FLAGYL) 500 MG tablet, Take 1 tablet (500 mg total) by mouth 2 (two) times daily., Disp: 14 tablet, Rfl: 0   No Known Allergies  Past Medical History:  Diagnosis Date   Acute appendicitis 07/18/2012   Hypertension    Obesity    Obesity, morbid, BMI 50 or higher [place] 07/18/2012   Severe preeclampsia 12/10/2014   Type 2 diabetes mellitus ([facility]      Past Surgical History:  Procedure Laterality Date   APPENDECTOMY  07/17/2012   CESAREAN SECTION N/A 12/18/2014   Procedure: CESAREAN SECTION;  Surgeon: UOsborne Oman MD;  [facility];  Service: Obstetrics;  Laterality: N/A;   LAPAROSCOPIC APPENDECTOMY N/A 07/17/2012   Procedure: APPENDECTOMY LAPAROSCOPIC;  Surgeon: BZenovia Jarred MD;  [facility];  Service: General;  Laterality: N/A;    Family History  Problem Relation Age of Onset   Hypertension Mother  Diabetes Mother    Hypertension Maternal Grandmother     Social History   Tobacco Use   Smoking status: Every Day    Packs/day: 0.25    Years: 1.20    Pack years: 0.30    Types: Cigarettes   Smokeless tobacco: Former    Quit date: 12/10/2014  Substance Use Topics   Alcohol use: No    Comment: rarely   Drug use: No    ROS   Objective:   Vitals: BP (!) 177/95 (BP Location: Right Arm)    Pulse 67    Temp 98.4 F (36.9 C) (Oral)    Ht 4' 9.75" (1.467 m)    Wt (!) 373 lb 0.3 oz (169.2 kg)    LMP 03/29/2021    SpO2 98%    BMI 78.64 kg/m   Physical Exam Constitutional:      General: She is not in acute distress.    Appearance: Normal appearance. She is well-developed. She  is obese. She is not ill-appearing, toxic-appearing or diaphoretic.  HENT:     Head: Normocephalic and atraumatic.     Right Ear: Ear canal and external ear normal. No drainage or tenderness. No middle ear effusion. There is no impacted cerumen. Tympanic membrane is not erythematous.     Left Ear: Ear canal and external ear normal. No drainage or tenderness.  No middle ear effusion. There is no impacted cerumen. Tympanic membrane is not erythematous.     Ears:     Comments: TMs opacified bilaterally.    Nose: Congestion present. No rhinorrhea.     Mouth/Throat:     Mouth: Mucous membranes are moist. No oral lesions.     Pharynx: No pharyngeal swelling, oropharyngeal exudate, posterior oropharyngeal erythema or uvula swelling.     Tonsils: No tonsillar exudate or tonsillar abscesses.     Comments: Significant post-nasal drainage overlying pharynx.  Eyes:     General: No scleral icterus.       Right eye: No discharge.        Left eye: No discharge.     Extraocular Movements: Extraocular movements intact.     Right eye: Normal extraocular motion.     Left eye: Normal extraocular motion.     Conjunctiva/sclera: Conjunctivae normal.  Cardiovascular:     Rate and Rhythm: Normal rate.     Heart sounds: No murmur heard.   No friction rub. No gallop.  Pulmonary:     Effort: Pulmonary effort is normal. No respiratory distress.     Breath sounds: No stridor. No wheezing, rhonchi or rales.  Chest:     Chest wall: No tenderness.  Musculoskeletal:     Cervical back: Normal range of motion and neck supple.  Lymphadenopathy:     Cervical: No cervical adenopathy.  Skin:    General: Skin is warm and dry.  Neurological:     General: No focal deficit present.     Mental Status: She is alert and oriented to person, place, and time.  Psychiatric:        Mood and Affect: Mood normal.        Behavior: Behavior normal.        Thought Content: Thought content normal.        Judgment: Judgment  normal.    Assessment and Plan :   PDMP not reviewed this encounter.  1. Viral URI with cough   2. Sinus congestion   3. Acute ear pain, bilateral   4. Smoker  5. Type 2 diabetes mellitus treated with insulin ([place])     Discussed antibiotic stewardship and that she has already had a course of Augmentin this month and no signs of bacterial infection on exam will defer more antibiotic use.  Patient was agreeable.  Deferred imaging given clear cardiopulmonary exam, hemodynamically stable vital signs. Will manage for viral illness such as viral URI, viral syndrome, viral rhinitis, COVID-19. Recommended supportive care. Offered scripts for symptomatic relief.  Given her uncontrolled diabetes we will also hold off on steroid use which she would benefit from in the context of her smoking and respiratory symptoms.  Advised that she follow-up with her PCP as soon as possible for recheck on her diabetes.  I encouraged patient to continue efforts at quitting smoking.  Testing is pending. Counseled patient on potential for adverse effects with medications prescribed/recommended today, ER and return-to-clinic precautions discussed, patient verbalized understanding.     Jaynee Eagles, PA-C 04/06/21 0900

## 2021-04-07 LAB — SARS-COV-2, NAA 2 DAY TAT

## 2021-04-07 LAB — NOVEL CORONAVIRUS, NAA: SARS-CoV-2, NAA: NOT DETECTED

## 2021-04-12 ENCOUNTER — Other Ambulatory Visit: Payer: Self-pay

## 2021-04-12 ENCOUNTER — Ambulatory Visit (HOSPITAL_COMMUNITY)
Admission: EM | Admit: 2021-04-12 | Discharge: 2021-04-12 | Disposition: A | Discharge status: Home / Self Care | Payer: Medicaid Other

## 2021-04-12 NOTE — ED Triage Notes (Signed)
Pt called from from front lobby with no answer

## 2021-04-12 NOTE — ED Notes (Signed)
Called pt in lobby no response   

## 2021-04-12 NOTE — ED Notes (Signed)
No answer in lobby and no answer on phone.

## 2021-04-27 ENCOUNTER — Ambulatory Visit
Admission: EM | Admit: 2021-04-27 | Discharge: 2021-04-27 | Discharge status: Against Medical Advice | Payer: Medicaid Other

## 2021-04-27 ENCOUNTER — Other Ambulatory Visit: Payer: Self-pay

## 2021-04-27 NOTE — ED Notes (Signed)
Called patient and left message that we have a room available for patient to be seen.

## 2021-04-27 NOTE — ED Notes (Signed)
Pt called and did not answer x 3

## 2021-05-07 ENCOUNTER — Ambulatory Visit
Admission: RE | Admit: 2021-05-07 | Discharge: 2021-05-07 | Disposition: A | Discharge status: Home / Self Care | Payer: Medicaid Other | Source: Ambulatory Visit | Attending: Physician Assistant | Admitting: Physician Assistant

## 2021-05-07 VITALS — BP 141/85 | HR 95 | Temp 98.5°F | Resp 18

## 2021-05-07 DIAGNOSIS — N939 Abnormal uterine and vaginal bleeding, unspecified: Secondary | ICD-10-CM | POA: Diagnosis not present

## 2021-05-07 LAB — POCT URINE PREGNANCY: Preg Test, Ur: NEGATIVE

## 2021-05-07 MED ORDER — NORGESTIM-ETH ESTRAD TRIPHASIC 0.18/0.215/0.25 MG-25 MCG PO TABS: 1.0000 | ORAL_TABLET | Freq: Every day | ORAL | 11 refills | Status: DC

## 2021-05-07 MED ORDER — NORGESTIM-ETH ESTRAD TRIPHASIC 0.18/0.215/0.25 MG-25 MCG PO TABS: 1.0000 | ORAL_TABLET | Freq: Every day | ORAL | 1 refills | Status: DC

## 2021-05-07 NOTE — ED Triage Notes (Signed)
Pt states has "been on my cycle since 04/08/21." Denies ob/gyn.  ?

## 2021-05-07 NOTE — ED Provider Notes (Signed)
[name]    CSN: [id number] Arrival date & time: 05/07/21  1705      History   Chief Complaint Chief Complaint  Patient presents with   Vaginal Bleeding    HPI Kayla Flynn is a 31 y.o. female.   Patient here today for evaluation of vaginal bleeding she has experienced since around February 12.  She reports that it is not unusual for her to have irregular cycles however she reports that typically she does not have prolonged periods as she is currently experiencing.  She notes that bleeding will wax and wane in intensity and volume, and that at times she will think she has completed her cycle,  but then will start bleeding again.  She denies any significant abdominal pain.  She has not had any fever.  She does report feeling somewhat faint today.  She denies any shortness of breath or chest pain.  She denies any concerns for STDs.  She is not currently on any contraceptive medication.  She denies any personal history of blood clots.   The history is provided by the patient.  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/2014   S/P cesarean section 07/14/2014   Obesity, morbid, BMI 82 ([place]) 07/18/2012   DM type 2 (diabetes mellitus, type 2) ([place]) 10/12/2010    Past Surgical History:  Procedure Laterality Date   APPENDECTOMY  07/17/2012   CESAREAN SECTION N/A 12/18/2014   Procedure: CESAREAN SECTION;  Surgeon: Osborne Oman, MD;  Location: [place] [facility];  Service: Obstetrics;  Laterality: N/A;   LAPAROSCOPIC APPENDECTOMY N/A 07/17/2012   Procedure: APPENDECTOMY LAPAROSCOPIC;  Surgeon: Zenovia Jarred, MD;  [facility];  Service: General;  Laterality: N/A;    OB History     Gravida  1   Para  1   Term  1   Preterm      AB      Living  1      SAB      IAB      Ectopic      Multiple  0   Live Births  1            Home Medications    Prior to Admission medications   Medication Sig Start Date End Date Taking? Authorizing Provider  Accu-Chek FastClix Lancets MISC Use to check FSBS twice a day. Dx: E11.65 Z79.4 12/05/19   Nicolette Bang, MD  baclofen (LIORESAL) 10 MG tablet Take 1 tablet (10 mg total) by mouth 2 (two) times daily as needed for muscle spasms. 10/16/20   Raspet, Derry Skill, PA-C  benzonatate (TESSALON) 100 MG capsule Take 1-2 capsules (100-200 mg total) by mouth 3 (three) times daily as needed for cough. 04/06/21   Jaynee Eagles, PA-C  Blood Glucose Monitoring Suppl (ACCU-CHEK GUIDE ME) w/Device KIT Use to check FSBS twice a day. Dx: E11.65 Z79.4 12/05/19   Nicolette Bang,  MD  glucose blood (ACCU-CHEK GUIDE) test strip Use to check FSBS twice a day. Dx: E11.65 Z79.4 12/05/19   Nicolette Bang, MD  ibuprofen (ADVIL) 600 MG tablet Take 1 tablet (600 mg total) by mouth every 6 (six) hours as needed for mild pain or moderate pain. 11/27/20   Teodora Medici, FNP  Insulin Pen Needle (B-D UF III MINI PEN NEEDLES) 31G X 5 MM MISC Use as instructed. Inject into the skin once daily  E11.65 Z79.4 02/12/19   Gildardo Pounds, NP  ipratropium (ATROVENT) 0.03 % nasal spray Place 2 sprays into both nostrils 2 (two) times daily. 04/06/21   Jaynee Eagles, PA-C  Lancets Misc. (ACCU-CHEK FASTCLIX LANCET) KIT Use to check FSBS twice a day. Dx: E11.65 Z79.4 12/05/19   Nicolette Bang, MD  levocetirizine (XYZAL) 5 MG tablet Take 1 tablet (5 mg total) by mouth every  evening. 04/06/21   Jaynee Eagles, PA-C  lidocaine (XYLOCAINE) 2 % solution Use as directed 15 mLs in the mouth or throat as needed for mouth pain. Swish and spit 03/10/21   [place], Michele Rockers, FNP  liraglutide (VICTOZA) 18 MG/3ML SOPN Inject 1.8 mg into the skin daily. 12/05/19   Nicolette Bang, MD  lisinopril (ZESTRIL) 5 MG tablet Take 1 tablet (5 mg total) by mouth daily. 02/12/19   Gildardo Pounds, NP  metFORMIN (GLUCOPHAGE) 1000 MG tablet Take 1 tablet (1,000 mg total) by mouth 2 (two) times daily with a meal. Take one tablet (548m)  by mouth twice a day for 7 days. Increase to 2 tablets (1000 mg) by mouth twice a day the following week  E11.65 Z79.4 12/05/19   WNicolette Bang MD  metroNIDAZOLE (FLAGYL) 500 MG tablet Take 1 tablet (500 mg total) by mouth 2 (two) times daily. 03/18/21   Lamptey,Myrene Galas MD  Norgestimate-Ethinyl Estradiol Triphasic (TRI-LO-SPRINTEC) 0.18/0.215/0.25 MG-25 MCG tab Take 1 tablet by mouth daily. 05/07/21   MFrancene Finders PA-C  promethazine-dextromethorphan (PROMETHAZINE-DM) 6.25-15 MG/5ML syrup Take 5 mLs by mouth at bedtime as needed for cough. 04/06/21   MJaynee Eagles PA-C    Family History Family History  Problem Relation Age of Onset   Hypertension Mother    Diabetes Mother    Hypertension Maternal Grandmother     Social History Social History   Tobacco Use   Smoking status: Every Day    Packs/day: 0.25    Years: 1.20    Pack years: 0.30    Types: Cigarettes   Smokeless tobacco: Former    Quit date: 12/10/2014  Substance Use Topics   Alcohol use: No    Comment: rarely   Drug use: No     Allergies   Patient has no known allergies.   Review of Systems Review of Systems  Constitutional:  Negative for chills and fever.  Eyes:  Negative for discharge and redness.  Gastrointestinal:  Negative for abdominal pain, nausea and vomiting.  Genitourinary:  Positive for vaginal bleeding. Negative for vaginal discharge.    Physical  Exam Triage Vital Signs ED Triage Vitals  Enc Vitals Group     BP      Pulse      Resp      Temp      Temp src      SpO2      Weight      Height      Head Circumference      Peak Flow  Pain Score      Pain Loc      Pain Edu?      Excl. in [place]?    No data found.  Updated Vital Signs BP (!) 141/85 (BP Location: Left Arm)    Pulse 95    Temp 98.5 F (36.9 C) (Oral)    Resp 18    SpO2 100%       Physical Exam Vitals and nursing note reviewed.  Constitutional:      General: She is not in acute distress.    Appearance: Normal appearance. She is not ill-appearing.  HENT:     Head: Normocephalic and atraumatic.  Eyes:     Conjunctiva/sclera: Conjunctivae normal.  Cardiovascular:     Rate and Rhythm: Normal rate.  Pulmonary:     Effort: Pulmonary effort is normal.  Neurological:     Mental Status: She is alert.  Psychiatric:        Mood and Affect: Mood normal.        Behavior: Behavior normal.        Thought Content: Thought content normal.     UC Treatments / Results  Labs (all labs ordered are listed, but only abnormal results are displayed) Labs Reviewed  CBC WITH DIFFERENTIAL/PLATELET  POCT URINE PREGNANCY    EKG   Radiology No results found.  Procedures Procedures (including critical care time)  Medications Ordered in UC Medications - No data to display  Initial Impression / Assessment and Plan / UC Course  I have reviewed the triage vital signs and the nursing notes.  Pertinent labs & imaging results that were available during my care of the patient were reviewed by me and considered in my medical decision making (see chart for details).    Patient here today for evaluation of abnormal uterine bleeding.  Vitals are stable.  Pregnancy test negative.  Will order CBC to rule out anemia given duration of bleeding.  We will start combination birth control pill, and recommended further evaluation with GYN.  Final Clinical Impressions(s) / UC  Diagnoses   Final diagnoses:  Abnormal uterine bleeding (AUB)   Discharge Instructions   None    ED Prescriptions     Medication Sig Dispense Auth. Provider   Norgestimate-Ethinyl Estradiol Triphasic (TRI-LO-SPRINTEC) 0.18/0.215/0.25 MG-25 MCG tab  (Status: Discontinued) Take 1 tablet by mouth daily. 28 tablet Ewell Poe F, PA-C   Norgestimate-Ethinyl Estradiol Triphasic (TRI-LO-SPRINTEC) 0.18/0.215/0.25 MG-25 MCG tab Take 1 tablet by mouth daily. 28 tablet Francene Finders, PA-C      PDMP not reviewed this encounter.   Francene Finders, PA-C 05/07/21 1755

## 2021-05-08 LAB — CBC WITH DIFFERENTIAL/PLATELET
Basophils Absolute: 0 10*3/uL (ref 0.0–0.2)
Basos: 0 %
EOS (ABSOLUTE): 0.2 10*3/uL (ref 0.0–0.4)
Eos: 2 %
Hematocrit: 39.5 % (ref 34.0–46.6)
Hemoglobin: 12.6 g/dL (ref 11.1–15.9)
Immature Grans (Abs): 0 10*3/uL (ref 0.0–0.1)
Immature Granulocytes: 0 %
Lymphocytes Absolute: 3.1 10*3/uL (ref 0.7–3.1)
Lymphs: 34 %
MCH: 29.5 pg (ref 26.6–33.0)
MCHC: 31.9 g/dL (ref 31.5–35.7)
MCV: 93 fL (ref 79–97)
Monocytes Absolute: 0.6 10*3/uL (ref 0.1–0.9)
Monocytes: 7 %
Neutrophils Absolute: 5.2 10*3/uL (ref 1.4–7.0)
Neutrophils: 57 %
Platelets: 347 10*3/uL (ref 150–450)
RBC: 4.27 x10E6/uL (ref 3.77–5.28)
RDW: 11.8 % (ref 11.7–15.4)
WBC: 9.1 10*3/uL (ref 3.4–10.8)

## 2021-05-14 ENCOUNTER — Encounter (HOSPITAL_COMMUNITY): Payer: Self-pay

## 2021-05-14 ENCOUNTER — Ambulatory Visit (HOSPITAL_COMMUNITY)
Admission: RE | Admit: 2021-05-14 | Discharge: 2021-05-14 | Disposition: A | Discharge status: Home / Self Care | Payer: Medicaid Other | Source: Ambulatory Visit | Attending: Student | Admitting: Student

## 2021-05-14 ENCOUNTER — Other Ambulatory Visit: Payer: Self-pay

## 2021-05-14 VITALS — BP 139/90 | HR 96 | Temp 98.2°F | Resp 20

## 2021-05-14 DIAGNOSIS — N939 Abnormal uterine and vaginal bleeding, unspecified: Secondary | ICD-10-CM | POA: Diagnosis not present

## 2021-05-14 DIAGNOSIS — I10 Essential (primary) hypertension: Secondary | ICD-10-CM

## 2021-05-14 NOTE — Discharge Instructions (Addendum)
-  Please [facility] the [facility] at next business day to schedule an appointment with them.  Since you have Medicaid, you should be able to schedule this yourself.  You need an ultrasound to determine what is going on to make you bleed. ?-Please check your blood pressure at home or at the pharmacy. If this continues to be >140/90, follow-up with your primary care provider for further blood pressure management/ medication titration. If you develop chest pain, shortness of breath, vision changes, the worst headache of your life- head straight to the ED or call 911. ?-If your bleeding worsens, and you develop abdominal pain, dizziness-and you cannot see your gynecologist-head to the emergency department. ? ?

## 2021-05-14 NOTE — ED Triage Notes (Signed)
Pt last seen on 05/07/21 for abnormal bleeding tx with oral contraceptives. Pt presents today c/o the same sxs (vaginal bleeding since 04/08/21).  She did not start the birth control due to previous issues with meds. Notes 5 day h/o abdominal cramping and back pain with some intermittent lightheadedness. The night before last, she reports passing a "bigger than a quarter" blood cot.  ?

## 2021-05-14 NOTE — ED Provider Notes (Signed)
[place]    CSN: [id number] Arrival date & time: 05/14/21  1555      History   Chief Complaint Chief Complaint  Patient presents with   Vaginal Bleeding    HPI Kayla Flynn is a 31 y.o. female presenting with abnormal vaginal bleeding.  She has a long history of this, with multiple visits per month to our clinic for it.  Describes few weeks of abdominal cramping and bleeding, she passed 1 clot last night.  Has been evaluated multiple times for this, and been prescribed birth control, but states this makes her suicidal so she has not started it.  She does note some intermittent lightheadedness, CBC wnl 1 week ago. Denies STI risk. States she is not pregnant or breastfeeding.   HPI  Past Medical History:  Diagnosis Date   Acute appendicitis 07/18/2012   Hypertension    Obesity    Obesity, morbid, BMI 50 or higher ([place]) 07/18/2012   Severe preeclampsia 12/10/2014   Type 2 diabetes mellitus 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, antepartum 07/21/2014   S/P cesarean section 07/14/2014   Obesity, morbid, BMI 82 ([place]) 07/18/2012   DM type 2 (diabetes mellitus, type 2) ([place]) 10/12/2010    Past Surgical History:  Procedure Laterality Date   APPENDECTOMY  07/17/2012   CESAREAN SECTION N/A 12/18/2014   Procedure: CESAREAN SECTION;  Surgeon: Osborne Oman, MD;  Location: [place] [facility];  Service: Obstetrics;  Laterality: N/A;   LAPAROSCOPIC APPENDECTOMY N/A 07/17/2012   Procedure: APPENDECTOMY LAPAROSCOPIC;  Surgeon: Zenovia Jarred, MD;  Location: [facility];  Service: General;  Laterality: N/A;    OB History     Gravida  1   Para  1   Term  1   Preterm      AB      Living  1      SAB      IAB      Ectopic      Multiple  0   Live Births   1            Home Medications    Prior to Admission medications   Medication Sig Start Date End Date Taking? Authorizing Provider  Accu-Chek FastClix Lancets MISC Use to check FSBS twice a day. Dx: E11.65 Z79.4 12/05/19   Nicolette Bang, MD  baclofen (LIORESAL) 10 MG tablet Take 1 tablet (10 mg total) by mouth 2 (two) times daily as needed for muscle spasms. 10/16/20   Raspet, Derry Skill, PA-C  benzonatate (TESSALON) 100 MG capsule Take 1-2 capsules (100-200 mg total) by mouth 3 (three) times daily as needed for cough. 04/06/21   Jaynee Eagles, PA-C  Blood Glucose Monitoring Suppl (ACCU-CHEK GUIDE ME) w/Device KIT Use to check FSBS twice a day. Dx: E11.65 Z79.4 12/05/19   Nicolette Bang, MD  glucose blood (ACCU-CHEK GUIDE) test strip Use to check FSBS twice a day. Dx: E11.65 Z79.4 12/05/19   Nicolette Bang, MD  ibuprofen (ADVIL) 600 MG tablet Take 1 tablet (600 mg total) by mouth every 6 (six) hours as needed for mild pain or moderate pain. 11/27/20   Teodora Medici, FNP  Insulin Pen Needle (B-D UF III MINI PEN NEEDLES) 31G X 5 MM MISC Use  as instructed. Inject into the skin once daily  E11.65 Z79.4 02/12/19   Gildardo Pounds, NP  ipratropium (ATROVENT) 0.03 % nasal spray Place 2 sprays into both nostrils 2 (two) times daily. 04/06/21   Jaynee Eagles, PA-C  Lancets Misc. (ACCU-CHEK FASTCLIX LANCET) KIT Use to check FSBS twice a day. Dx: E11.65 Z79.4 12/05/19   Nicolette Bang, MD  levocetirizine (XYZAL) 5 MG tablet Take 1 tablet (5 mg total) by mouth every evening. 04/06/21   Jaynee Eagles, PA-C  lidocaine (XYLOCAINE) 2 % solution Use as directed 15 mLs in the mouth or throat as needed for mouth pain. Swish and spit 03/10/21   Mound, Michele Rockers, FNP  liraglutide (VICTOZA) 18 MG/3ML SOPN Inject 1.8 mg into the skin daily. 12/05/19   Nicolette Bang, MD  lisinopril (ZESTRIL) 5 MG tablet Take 1 tablet (5 mg total) by mouth daily. 02/12/19   Gildardo Pounds, NP   metFORMIN (GLUCOPHAGE) 1000 MG tablet Take 1 tablet (1,000 mg total) by mouth 2 (two) times daily with a meal. Take one tablet (572m)  by mouth twice a day for 7 days. Increase to 2 tablets (1000 mg) by mouth twice a day the following week  E11.65 Z79.4 12/05/19   WNicolette Bang MD  metroNIDAZOLE (FLAGYL) 500 MG tablet Take 1 tablet (500 mg total) by mouth 2 (two) times daily. 03/18/21   Lamptey,Myrene Galas MD  Norgestimate-Ethinyl Estradiol Triphasic (TRI-LO-SPRINTEC) 0.18/0.215/0.25 MG-25 MCG tab Take 1 tablet by mouth daily. 05/07/21   MFrancene Finders PA-C  promethazine-dextromethorphan (PROMETHAZINE-DM) 6.25-15 MG/5ML syrup Take 5 mLs by mouth at bedtime as needed for cough. 04/06/21   MJaynee Eagles PA-C    Family History Family History  Problem Relation Age of Onset   Hypertension Mother    Diabetes Mother    Hypertension Maternal Grandmother     Social History Social History   Tobacco Use   Smoking status: Every Day    Packs/day: 0.25    Years: 1.20    Pack years: 0.30    Types: Cigarettes   Smokeless tobacco: Former    Quit date: 12/10/2014  Substance Use Topics   Alcohol use: No    Comment: rarely   Drug use: No     Allergies   Patient has no known allergies.   Review of Systems Review of Systems  Constitutional:  Negative for appetite change, chills, diaphoresis and fever.  Respiratory:  Negative for shortness of breath.   Cardiovascular:  Negative for chest pain.  Gastrointestinal:  Negative for abdominal pain, blood in stool, constipation, diarrhea, nausea and vomiting.  Genitourinary:  Positive for vaginal bleeding. Negative for decreased urine volume, difficulty urinating, dysuria, flank pain, frequency, genital sores, hematuria, urgency, vaginal discharge and vaginal pain.  Musculoskeletal:  Negative for back pain.  Neurological:  Negative for dizziness, weakness and light-headedness.  All other systems reviewed and are negative.   Physical  Exam Triage Vital Signs ED Triage Vitals  Enc Vitals Group     BP 05/14/21 1613 (!) 183/152     Pulse Rate 05/14/21 1613 96     Resp 05/14/21 1613 20     Temp 05/14/21 1613 98.2 F (36.8 C)     Temp Source 05/14/21 1613 Oral     SpO2 05/14/21 1613 98 %     Weight --      Height --      Head Circumference --      Peak Flow --  Pain Score 05/14/21 1612 7     Pain Loc --      Pain Edu? --      Excl. in [place]? --    No data found.  Updated Vital Signs BP (!) 183/152 (BP Location: Left Arm)    Pulse 96    Temp 98.2 F (36.8 C) (Oral)    Resp 20    SpO2 98%   Visual Acuity Right Eye Distance:   Left Eye Distance:   Bilateral Distance:    Right Eye Near:   Left Eye Near:    Bilateral Near:     Physical Exam Vitals reviewed.  Constitutional:      General: She is not in acute distress.    Appearance: Normal appearance. She is not ill-appearing.  HENT:     Head: Normocephalic and atraumatic.     Mouth/Throat:     Mouth: Mucous membranes are moist.     Comments: Moist mucous membranes Eyes:     Extraocular Movements: Extraocular movements intact.     Pupils: Pupils are equal, round, and reactive to light.  Cardiovascular:     Rate and Rhythm: Normal rate and regular rhythm.     Heart sounds: Normal heart sounds.  Pulmonary:     Effort: Pulmonary effort is normal.     Breath sounds: Normal breath sounds. No wheezing, rhonchi or rales.  Abdominal:     General: Bowel sounds are normal. There is no distension.     Palpations: Abdomen is soft. There is no mass.     Tenderness: There is no abdominal tenderness. There is no right CVA tenderness, left CVA tenderness, guarding or rebound.     Comments: Exam limited due to body habitus.   Genitourinary:    Comments: deferred Skin:    General: Skin is warm.     Capillary Refill: Capillary refill takes less than 2 seconds.     Comments: Good skin turgor  Neurological:     General: No focal deficit present.     Mental  Status: She is alert and oriented to person, place, and time.  Psychiatric:        Mood and Affect: Mood normal.        Behavior: Behavior normal.     UC Treatments / Results  Labs (all labs ordered are listed, but only abnormal results are displayed) Labs Reviewed  CERVICOVAGINAL ANCILLARY ONLY    EKG   Radiology No results found.  Procedures Procedures (including critical care time)  Medications Ordered in UC Medications - No data to display  Initial Impression / Assessment and Plan / UC Course  I have reviewed the triage vital signs and the nursing notes.  Pertinent labs & imaging results that were available during my care of the patient were reviewed by me and considered in my medical decision making (see chart for details).     This patient is a very pleasant 31 y.o. year old female presenting with AUB x weeks. Multiple visits to the urgent care for this, has never seen an ob-gyn. U-preg negative 05/07/21, States she is not pregnant or breastfeeding. Denies STI risk. She is not anemic, CBC wnl 05/07/21.   Discussed that she NEEDS to see an OB/GYN for further evaluation.  I extensively discussed the anatomy of the uterus, vagina, ovaries, and that she needs an ultrasound for further management.  As OCP has made her suicidal in the past, advised against starting this as prescribed last visit with [place].  She would likely benefit from an IUD, which her gynecologist could insert.  As she has family-planning Medicaid, provided her with women's health resources, she can establish care with them herself.  Head to the emergency department if bleeding worsens or worsening abdominal pain or fevers.  BP initially incorrectly recorded as 183/152; 139/90 when I rechecked this myself. No CP, SOB, dizziness, headaches, vision changes. Has not taken her lisinopril yet today.   Final Clinical Impressions(s) / UC Diagnoses   Final diagnoses:  Abnormal uterine bleeding (AUB)  Essential  hypertension     Discharge Instructions      -Please call the women's med Center at next business day to schedule an appointment with them.  Since you have Medicaid, you should be able to schedule this yourself.  You need an ultrasound to determine what is going on to make you bleed. -Please check your blood pressure at home or at the pharmacy. If this continues to be >140/90, follow-up with your primary care provider for further blood pressure management/ medication titration. If you develop chest pain, shortness of breath, vision changes, the worst headache of your life- head straight to the ED or call 911. -If your bleeding worsens, and you develop abdominal pain, dizziness-and you cannot see your gynecologist-head to the emergency department.    ED Prescriptions   None    PDMP not reviewed this encounter.   Hazel Sams, PA-C 05/14/21 1656

## 2021-05-17 ENCOUNTER — Emergency Department (HOSPITAL_BASED_OUTPATIENT_CLINIC_OR_DEPARTMENT_OTHER)
Admit: 2021-05-17 | Discharge: 2021-05-17 | Disposition: A | Discharge status: Home / Self Care | Payer: Medicaid Other | Attending: Emergency Medicine | Admitting: Emergency Medicine

## 2021-05-17 ENCOUNTER — Emergency Department (HOSPITAL_COMMUNITY): Payer: Medicaid Other

## 2021-05-17 ENCOUNTER — Emergency Department (HOSPITAL_COMMUNITY)
Admission: EM | Admit: 2021-05-17 | Discharge: 2021-05-17 | Disposition: A | Discharge status: Home / Self Care | Payer: Medicaid Other | Attending: Emergency Medicine | Admitting: Emergency Medicine

## 2021-05-17 ENCOUNTER — Encounter (HOSPITAL_COMMUNITY): Payer: Self-pay

## 2021-05-17 DIAGNOSIS — R609 Edema, unspecified: Secondary | ICD-10-CM | POA: Diagnosis not present

## 2021-05-17 DIAGNOSIS — N920 Excessive and frequent menstruation with regular cycle: Secondary | ICD-10-CM | POA: Diagnosis not present

## 2021-05-17 DIAGNOSIS — Z794 Long term (current) use of insulin: Secondary | ICD-10-CM | POA: Diagnosis not present

## 2021-05-17 DIAGNOSIS — N921 Excessive and frequent menstruation with irregular cycle: Secondary | ICD-10-CM

## 2021-05-17 DIAGNOSIS — Z79899 Other long term (current) drug therapy: Secondary | ICD-10-CM | POA: Insufficient documentation

## 2021-05-17 DIAGNOSIS — R2242 Localized swelling, mass and lump, left lower limb: Secondary | ICD-10-CM | POA: Diagnosis not present

## 2021-05-17 DIAGNOSIS — N938 Other specified abnormal uterine and vaginal bleeding: Secondary | ICD-10-CM

## 2021-05-17 DIAGNOSIS — N925 Other specified irregular menstruation: Secondary | ICD-10-CM | POA: Diagnosis not present

## 2021-05-17 LAB — BASIC METABOLIC PANEL
Anion gap: 10 (ref 5–15)
BUN: 9 mg/dL (ref 6–20)
CO2: 27 mmol/L (ref 22–32)
Calcium: 9.1 mg/dL (ref 8.9–10.3)
Chloride: 101 mmol/L (ref 98–111)
Creatinine, Ser: 0.63 mg/dL (ref 0.44–1.00)
GFR, Estimated: >60 mL/min (ref >60)
Glucose, Bld: 278 mg/dL — ABNORMAL HIGH (ref 70–99)
Potassium: 3.9 mmol/L (ref 3.5–5.1)
Sodium: 138 mmol/L (ref 135–145)

## 2021-05-17 LAB — URINALYSIS, ROUTINE W REFLEX MICROSCOPIC: RBC / HPF: >50 RBC/hpf — ABNORMAL HIGH (ref 0–5)

## 2021-05-17 LAB — CBC WITH DIFFERENTIAL/PLATELET
Abs Immature Granulocytes: 0.03 10*3/uL (ref 0.00–0.07)
Basophils Absolute: 0 10*3/uL (ref 0.0–0.1)
Basophils Relative: 0 %
Eosinophils Absolute: 0.2 10*3/uL (ref 0.0–0.5)
Eosinophils Relative: 2 %
HCT: 39.9 % (ref 36.0–46.0)
Hemoglobin: 12.9 g/dL (ref 12.0–15.0)
Immature Granulocytes: 0 %
Lymphocytes Relative: 26 %
Lymphs Abs: 2.7 10*3/uL (ref 0.7–4.0)
MCH: 30.8 pg (ref 26.0–34.0)
MCHC: 32.3 g/dL (ref 30.0–36.0)
MCV: 95.2 fL (ref 80.0–100.0)
Monocytes Absolute: 0.7 10*3/uL (ref 0.1–1.0)
Monocytes Relative: 7 %
Neutro Abs: 6.7 10*3/uL (ref 1.7–7.7)
Neutrophils Relative %: 65 %
Platelets: 319 10*3/uL (ref 150–400)
RBC: 4.19 MIL/uL (ref 3.87–5.11)
RDW: 12.3 % (ref 11.5–15.5)
WBC: 10.4 10*3/uL (ref 4.0–10.5)
nRBC: 0 % (ref 0.0–0.2)

## 2021-05-17 LAB — POC URINE PREG, ED: Preg Test, Ur: NEGATIVE

## 2021-05-17 LAB — PROTIME-INR
INR: 1 (ref 0.8–1.2)
Prothrombin Time: 12.7 seconds (ref 11.4–15.2)

## 2021-05-17 NOTE — ED Provider Notes (Signed)
[facility]-EMERGENCY DEPT Provider Note   CSN: [id number] Arrival date & time: 05/17/21  1118     History  Chief Complaint  Patient presents with   Vaginal Bleeding    Kayla Flynn is a 31 y.o. female.  HPI     31 year old female comes in with chief complaint of vaginal bleeding.  Patient indicates that she has been having bleeding now for the last 5 weeks.  The bleeding has been heavy, and not usual for her.  She has been putting in a new pad every 2 hours.  She has been to the urgent care several times.  They have done STD evaluation, checked her CBC but beyond that have advised her to come to the ER.  They did prescribe her with birth control, but patient is apprehensive about taking birth control because of side effects.  Patient also complains of lumps to her left leg.  Home Medications Prior to Admission medications   Medication Sig Start Date End Date Taking? Authorizing Provider  Accu-Chek FastClix Lancets MISC Use to check FSBS twice a day. Dx: E11.65 Z79.4 12/05/19   Arvilla Market, MD  baclofen (LIORESAL) 10 MG tablet Take 1 tablet (10 mg total) by mouth 2 (two) times daily as needed for muscle spasms. 10/16/20   Raspet, Noberto Retort, PA-C  benzonatate (TESSALON) 100 MG capsule Take 1-2 capsules (100-200 mg total) by mouth 3 (three) times daily as needed for cough. 04/06/21   Wallis Bamberg, PA-C  Blood Glucose Monitoring Suppl (ACCU-CHEK GUIDE ME) w/Device KIT Use to check FSBS twice a day. Dx: E11.65 Z79.4 12/05/19   Arvilla Market, MD  glucose blood (ACCU-CHEK GUIDE) test strip Use to check FSBS twice a day. Dx: E11.65 Z79.4 12/05/19   Arvilla Market, MD  ibuprofen (ADVIL) 600 MG tablet Take 1 tablet (600 mg total) by mouth every 6 (six) hours as needed for mild pain or moderate pain. 11/27/20   Gustavus Bryant, FNP  Insulin Pen Needle (B-D UF III MINI PEN NEEDLES) 31G X 5 MM MISC Use as instructed. Inject into the skin once daily   E11.65 Z79.4 02/12/19   Claiborne Rigg, NP  ipratropium (ATROVENT) 0.03 % nasal spray Place 2 sprays into both nostrils 2 (two) times daily. 04/06/21   Wallis Bamberg, PA-C  Lancets Misc. (ACCU-CHEK FASTCLIX LANCET) KIT Use to check FSBS twice a day. Dx: E11.65 Z79.4 12/05/19   Arvilla Market, MD  levocetirizine (XYZAL) 5 MG tablet Take 1 tablet (5 mg total) by mouth every evening. 04/06/21   Wallis Bamberg, PA-C  lidocaine (XYLOCAINE) 2 % solution Use as directed 15 mLs in the mouth or throat as needed for mouth pain. Swish and spit 03/10/21   Mound, Acie Fredrickson, FNP  liraglutide (VICTOZA) 18 MG/3ML SOPN Inject 1.8 mg into the skin daily. 12/05/19   Arvilla Market, MD  lisinopril (ZESTRIL) 5 MG tablet Take 1 tablet (5 mg total) by mouth daily. 02/12/19   Claiborne Rigg, NP  metFORMIN (GLUCOPHAGE) 1000 MG tablet Take 1 tablet (1,000 mg total) by mouth 2 (two) times daily with a meal. Take one tablet (500mg )  by mouth twice a day for 7 days. Increase to 2 tablets (1000 mg) by mouth twice a day the following week  E11.65 Z79.4 12/05/19   Arvilla Market, MD  metroNIDAZOLE (FLAGYL) 500 MG tablet Take 1 tablet (500 mg total) by mouth 2 (two) times daily. 03/18/21   Lamptey, Britta Mccreedy, MD  Norgestimate-Ethinyl Estradiol Triphasic (TRI-LO-SPRINTEC) 0.18/0.215/0.25 MG-25 MCG tab Take 1 tablet by mouth daily. 05/07/21   Tomi Bamberger, PA-C  promethazine-dextromethorphan (PROMETHAZINE-DM) 6.25-15 MG/5ML syrup Take 5 mLs by mouth at bedtime as needed for cough. 04/06/21   Wallis Bamberg, PA-C      Allergies    Patient has no known allergies.    Review of Systems   Review of Systems  Physical Exam Updated Vital Signs BP (!) 146/81 (BP Location: Right Arm)   Pulse 82   Temp 98.3 F (36.8 C) (Oral)   Resp 17   SpO2 97%  Physical Exam  ED Results / Procedures / Treatments   Labs (all labs ordered are listed, but only abnormal results are displayed) Labs Reviewed  URINALYSIS, ROUTINE W  REFLEX MICROSCOPIC - Abnormal; Notable for the following components:      Result Value   Color, Urine RED (*)    APPearance CLOUDY (*)    Glucose, UA   (*)    Value: TEST NOT REPORTED DUE TO COLOR INTERFERENCE OF URINE PIGMENT   Hgb urine dipstick   (*)    Value: TEST NOT REPORTED DUE TO COLOR INTERFERENCE OF URINE PIGMENT   Bilirubin Urine   (*)    Value: TEST NOT REPORTED DUE TO COLOR INTERFERENCE OF URINE PIGMENT   Ketones, ur   (*)    Value: TEST NOT REPORTED DUE TO COLOR INTERFERENCE OF URINE PIGMENT   Protein, ur   (*)    Value: TEST NOT REPORTED DUE TO COLOR INTERFERENCE OF URINE PIGMENT   Nitrite   (*)    Value: TEST NOT REPORTED DUE TO COLOR INTERFERENCE OF URINE PIGMENT   Leukocytes,Ua   (*)    Value: TEST NOT REPORTED DUE TO COLOR INTERFERENCE OF URINE PIGMENT   RBC / HPF >50 (*)    Bacteria, UA FEW (*)    All other components within normal limits  BASIC METABOLIC PANEL - Abnormal; Notable for the following components:   Glucose, Bld 278 (*)    All other components within normal limits  CBC WITH DIFFERENTIAL/PLATELET  PROTIME-INR  POC URINE PREG, ED    EKG None  Radiology VAS [place] LOWER EXTREMITY VENOUS (DVT) (7a-7p)  Result Date: 05/17/2021  Lower Venous DVT Study Patient Name:  Kayla Flynn  Date of Exam:   05/17/2021 Medical Rec #: [id number]    Accession #:    [id number] Date of Birth: 1990-06-28    Patient Gender: F Patient Age:   31 years Exam Location:  [facility] Procedure:      VAS [place] LOWER EXTREMITY VENOUS (DVT) Referring Phys: Janey Genta Keyli Duross --------------------------------------------------------------------------------  Indications: Edema.  Risk Factors: None identified. Limitations: Body habitus and poor ultrasound/tissue interface. Comparison Study: No prior studies. Performing Technologist: Chanda Busing RVT  Examination Guidelines: A complete evaluation includes B-mode imaging, spectral Doppler, color Doppler, and power Doppler as needed of all  accessible portions of each vessel. Bilateral testing is considered an integral part of a complete examination. Limited examinations for reoccurring indications may be performed as noted. The reflux portion of the exam is performed with the patient in reverse Trendelenburg.  +-----+---------------+---------+-----------+----------+--------------+ RIGHTCompressibilityPhasicitySpontaneityPropertiesThrombus Aging +-----+---------------+---------+-----------+----------+--------------+ CFV  Full           Yes      Yes                                 +-----+---------------+---------+-----------+----------+--------------+   +---------+---------------+---------+-----------+----------+--------------+  LEFT     CompressibilityPhasicitySpontaneityPropertiesThrombus Aging +---------+---------------+---------+-----------+----------+--------------+ CFV      Full           Yes      Yes                                 +---------+---------------+---------+-----------+----------+--------------+ SFJ      Full                                                        +---------+---------------+---------+-----------+----------+--------------+ FV Prox  Full                                                        +---------+---------------+---------+-----------+----------+--------------+ FV Mid   Full                                                        +---------+---------------+---------+-----------+----------+--------------+ FV DistalFull                                                        +---------+---------------+---------+-----------+----------+--------------+ PFV      Full                                                        +---------+---------------+---------+-----------+----------+--------------+ POP      Full           Yes      Yes                                 +---------+---------------+---------+-----------+----------+--------------+ PTV      Full                                                         +---------+---------------+---------+-----------+----------+--------------+ PERO     Full                                                        +---------+---------------+---------+-----------+----------+--------------+    Summary: RIGHT: - No evidence of common femoral vein obstruction.  LEFT: - There is no evidence of deep vein thrombosis in the lower extremity. However, portions of this examination were limited- see technologist comments above.  - No cystic structure found in the  popliteal fossa.  *See table(s) above for measurements and observations.    Preliminary     Procedures Procedures    Medications Ordered in ED Medications - No data to display  ED Course/ Medical Decision Making/ A&P                           Medical Decision Making Amount and/or Complexity of Data Reviewed Labs: ordered. Radiology: ordered.  31 year old female comes in with chief complaint of vaginal bleeding.  Patient has had bleeding for the last 5 weeks.  She is also complaining of some left leg swelling.  DDx includes: Ectopic pregnancy Threatened abortion Inevitable abortion Miscarriage Spontaneous abortion Trauma/cerval laceration Placenta previa Placenta abruptio Ruptured uterus  She does not have any significant abdominal tenderness, therefore I doubt this is ectopic pregnancy, miscarriage, ruptured uterus. She denies any high risk sexual behavior and has had STI evaluation at the urgent care recently which was negative.  Best to get ultrasound DVT and ultrasound pelvis to ensure there is no evidence of large ovarian cyst, large fibroids.  Patient already has been prescribed birth control, she can start taking them or follow-up with outpatient gynecologist was contact information has been provided.  Patient's hemoglobin is stable and within normal limits.   Final Clinical Impression(s) / ED Diagnoses Final diagnoses:   Menorrhagia with irregular cycle  Dysfunctional uterine bleeding    Rx / DC Orders ED Discharge Orders     None         Derwood Kaplan, MD 05/17/21 1550

## 2021-05-17 NOTE — Progress Notes (Signed)
Left lower extremity venous duplex has been completed. ?Preliminary results can be found in CV Proc through chart review.  ?Results were given to Dr. Rhunette Croft. ? ?05/17/21 3:34 PM ?Olen Cordial RVT   ?

## 2021-05-17 NOTE — ED Triage Notes (Signed)
Pt arrived via POV, c/o heavy vaginal bleeding x5 weeks with large clots. States she is going through approx 2 pads an hour. States she has felt more fatigue than normal.  ? ?Also c/o HTN, has hx of HTN, out of meds right now. Requesting refill ? ? ?Also c/o "lump" on left leg she would like checked out.  ? ? ?

## 2021-05-17 NOTE — Discharge Instructions (Addendum)
We saw you in the ER for bleeding. ?All the results in the ER are normal, labs and imaging. ?We are not sure what is causing your symptoms. ? ?The workup in the ER is not complete, and is limited to screening for life threatening and emergent conditions only, so please see the gynecologist as an outpatient. Consider starting the birth control pill to control the bleeding. ?

## 2021-07-01 ENCOUNTER — Encounter: Payer: Self-pay | Admitting: Emergency Medicine

## 2021-07-01 ENCOUNTER — Ambulatory Visit
Admission: EM | Admit: 2021-07-01 | Discharge: 2021-07-01 | Disposition: A | Discharge status: Home / Self Care | Payer: Medicaid Other | Attending: Internal Medicine | Admitting: Internal Medicine

## 2021-07-01 DIAGNOSIS — K0889 Other specified disorders of teeth and supporting structures: Secondary | ICD-10-CM | POA: Diagnosis not present

## 2021-07-01 DIAGNOSIS — K047 Periapical abscess without sinus: Secondary | ICD-10-CM | POA: Diagnosis not present

## 2021-07-01 MED ORDER — AMOXICILLIN-POT CLAVULANATE 875-125 MG PO TABS: 1.0000 | ORAL_TABLET | Freq: Two times a day (BID) | ORAL | 0 refills | Status: DC

## 2021-07-01 NOTE — Discharge Instructions (Signed)
You have been prescribed an antibiotic for dental infection.  Please follow-up dentist for further evaluation and management. ?

## 2021-07-01 NOTE — ED Provider Notes (Signed)
?[facility] ? ? ? ?CSN: [id number] ?Arrival date & time: 07/01/21  0935 ? ? ?  ? ?History   ?Chief Complaint ?Chief Complaint  ?Patient presents with  ? Dental Pain  ? ? ?HPI ?Kayla Flynn is a 31 y.o. female.  ? ?Patient presents with left upper dental pain that has been present for approximately 1 week.  Patient used leftover viscous lidocaine as well as over-the-counter pain relievers with minimal improvement.  Pain worsened last night per patient.  Patient has been seen several times in urgent care for dental pain but reports that she has not seen a dentist due to not having dental insurance.  She started a new job in May and is supposed to get dental insurance at that time.  Denies fevers, body aches, chills.  Patient also has elevated blood pressure reading but reports that she has not taken her blood pressure medication today.  Denies chest pain, shortness of breath, headache, dizziness, blurred vision, nausea, vomiting. ? ? ?Dental Pain ? ?Past Medical History:  ?Diagnosis Date  ? Acute appendicitis 07/18/2012  ? Hypertension   ? Obesity   ? Obesity, morbid, BMI 50 or higher ([place]) 07/18/2012  ? Severe preeclampsia 12/10/2014  ? Type 2 diabetes mellitus ([place])   ? ? ?Patient Active Problem List  ? Diagnosis Date Noted  ? Hyperlipidemia 12/05/2019  ? Benign essential HTN 01/28/2015  ? History of syphilis 08/11/2014  ? Maternal varicella, non-immune 07/21/2014  ? 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 S/P cesarean section 07/14/2014  ? Obesity, morbid, BMI 82 ([place]) 07/18/2012  ? DM type 2 (diabetes mellitus, type 2) ([place]) 10/12/2010  ? ? ?Past Surgical History:  ?Procedure Laterality Date  ? APPENDECTOMY  07/17/2012  ? CESAREAN SECTION N/A 12/18/2014  ? Procedure: CESAREAN SECTION;  Surgeon: Osborne Oman, MD;  Location: [facility];  Service: Obstetrics;  Laterality: N/A;  ? LAPAROSCOPIC APPENDECTOMY N/A 07/17/2012  ? Procedure: APPENDECTOMY LAPAROSCOPIC;  Surgeon:  Zenovia Jarred, MD;  Location: [facility];  Service: General;  Laterality: N/A;  ? ? ?OB History   ? ? Gravida  ?1  ? Para  ?1  ? Term  ?1  ? Preterm  ?   ? AB  ?   ? Living  ?1  ?  ? ? SAB  ?   ? IAB  ?   ? Ectopic  ?   ? Multiple  ?0  ? Live Births  ?1  ?   ?  ?  ? ? ? ?Home Medications   ? ?Prior to Admission medications   ?Medication Sig Start Date End Date Taking? Authorizing Provider  ?Accu-Chek FastClix Lancets MISC Use to check FSBS twice a day. Dx: E11.65 Z79.4 12/05/19  Yes Nicolette Bang, MD  ?amoxicillin-clavulanate (AUGMENTIN) 875-125 MG tablet Take 1 tablet by mouth every 12 (twelve) hours. 07/01/21  Yes Delorse Shane, Michele Rockers, FNP  ?baclofen (LIORESAL) 10 MG tablet Take 1 tablet (10 mg total) by mouth 2 (two) times daily as needed for muscle spasms. 10/16/20  Yes Raspet, Junie Panning K, PA-C  ?benzonatate (TESSALON) 100 MG capsule Take 1-2 capsules (100-200 mg total) by mouth 3 (three) times daily as needed for cough. 04/06/21  Yes Jaynee Eagles, PA-C  ?Blood Glucose Monitoring Suppl (ACCU-CHEK GUIDE ME) w/Device KIT Use to check FSBS twice a day. Dx: E11.65 Z79.4 12/05/19  Yes Nicolette Bang, MD  ?glucose blood (ACCU-CHEK GUIDE) test strip Use to check FSBS twice  a day. Dx: E11.65 Z79.4 12/05/19  Yes Nicolette Bang, MD  ?ibuprofen (ADVIL) 600 MG tablet Take 1 tablet (600 mg total) by mouth every 6 (six) hours as needed for mild pain or moderate pain. 11/27/20  Yes Teodora Medici, FNP  ?Insulin Pen Needle (B-D UF III MINI PEN NEEDLES) 31G X 5 MM MISC Use as instructed. Inject into the skin once daily  E11.65 Z79.4 02/12/19  Yes Gildardo Pounds, NP  ?ipratropium (ATROVENT) 0.03 % nasal spray Place 2 sprays into both nostrils 2 (two) times daily. 04/06/21  Yes Jaynee Eagles, PA-C  ?Lancets Misc. (ACCU-CHEK FASTCLIX LANCET) KIT Use to check FSBS twice a day. Dx: E11.65 Z79.4 12/05/19  Yes Nicolette Bang, MD  ?levocetirizine Harlow Ohms) 5 MG tablet Take 1 tablet (5 mg total) by mouth every  evening. 04/06/21  Yes Jaynee Eagles, PA-C  ?lidocaine (XYLOCAINE) 2 % solution Use as directed 15 mLs in the mouth or throat as needed for mouth pain. Swish and spit 03/10/21  Yes Teodora Medici, FNP  ?liraglutide (VICTOZA) 18 MG/3ML SOPN Inject 1.8 mg into the skin daily. 12/05/19  Yes Nicolette Bang, MD  ?lisinopril (ZESTRIL) 5 MG tablet Take 1 tablet (5 mg total) by mouth daily. 02/12/19  Yes Gildardo Pounds, NP  ?metFORMIN (GLUCOPHAGE) 1000 MG tablet Take 1 tablet (1,000 mg total) by mouth 2 (two) times daily with a meal. Take one tablet (565m)  by mouth twice a day for 7 days. Increase to 2 tablets (1000 mg) by mouth twice a day the following week  E11.65 Z79.4 12/05/19  Yes WNicolette Bang MD  ?metroNIDAZOLE (FLAGYL) 500 MG tablet Take 1 tablet (500 mg total) by mouth 2 (two) times daily. 03/18/21  Yes Lamptey, PMyrene Galas MD  ?Norgestimate-Ethinyl Estradiol Triphasic (TRI-LO-SPRINTEC) 0.18/0.215/0.25 MG-25 MCG tab Take 1 tablet by mouth daily. 05/07/21  Yes MFrancene Finders PA-C  ?promethazine-dextromethorphan (PROMETHAZINE-DM) 6.25-15 MG/5ML syrup Take 5 mLs by mouth at bedtime as needed for cough. 04/06/21  Yes MJaynee Eagles PA-C  ? ? ?Family History ?Family History  ?Problem Relation Age of Onset  ? Hypertension Mother   ? Diabetes Mother   ? Hypertension Maternal Grandmother   ? ? ?Social History ?Social History  ? ?Tobacco Use  ? Smoking status: Every Day  ?  Packs/day: 0.25  ?  Years: 1.20  ?  Pack years: 0.30  ?  Types: Cigarettes  ? Smokeless tobacco: Former  ?  Quit date: 12/10/2014  ?Substance Use Topics  ? Alcohol use: No  ?  Comment: rarely  ? Drug use: No  ? ? ? ?Allergies   ?Patient has no known allergies. ? ? ?Review of Systems ?Review of Systems ?Per HPI ? ?Physical Exam ?Triage Vital Signs ?ED Triage Vitals  ?Enc Vitals Group  ?   BP 07/01/21 0944 (!) 186/99  ?   Pulse Rate 07/01/21 0944 68  ?   Resp 07/01/21 0944 20  ?   Temp 07/01/21 0944 97.6 ?F (36.4 ?C)  ?   Temp Source  07/01/21 0944 Oral  ?   SpO2 07/01/21 0944 95 %  ?   Weight 07/01/21 0946 (!) 363 lb (164.7 kg)  ?   Height 07/01/21 0946 _0  (1.499 m)  ?   Head Circumference --   ?   Peak Flow --   ?   Pain Score 07/01/21 0946 10  ?   Pain Loc --   ?   Pain Edu? --   ?  Excl. in [place]? --   ? ?No data found. ? ?Updated Vital Signs ?BP (!) 186/99 (BP Location: Left Arm)   Pulse 68   Temp 97.6 ?F (36.4 ?C) (Oral)   Resp 20   Ht _0  (1.499 m)   Wt (!) 363 lb (164.7 kg)   LMP 07/01/2021   SpO2 95%   BMI 73.32 kg/m?  ? ?Visual Acuity ?Right Eye Distance:   ?Left Eye Distance:   ?Bilateral Distance:   ? ?Right Eye Near:   ?Left Eye Near:    ?Bilateral Near:    ? ?Physical Exam ?Constitutional:   ?   General: She is not in acute distress. ?   Appearance: Normal appearance. She is not toxic-appearing or diaphoretic.  ?HENT:  ?   Head: Normocephalic and atraumatic.  ?   Mouth/Throat:  ?   Lips: Pink.  ?   Dentition: Abnormal dentition. Dental tenderness and gingival swelling present.  ?   Comments: Gingival erythema and swelling located to left upper back gingiva. ?Eyes:  ?   Extraocular Movements: Extraocular movements intact.  ?   Conjunctiva/sclera: Conjunctivae normal.  ?   Pupils: Pupils are equal, round, and reactive to light.  ?Cardiovascular:  ?   Rate and Rhythm: Normal rate and regular rhythm.  ?   Pulses: Normal pulses.  ?   Heart sounds: Normal heart sounds.  ?Pulmonary:  ?   Effort: Pulmonary effort is normal.  ?   Breath sounds: Normal breath sounds.  ?Neurological:  ?   General: No focal deficit present.  ?   Mental Status: She is alert and oriented to person, place, and time. Mental status is at baseline.  ?   Cranial Nerves: Cranial nerves 2-12 are intact.  ?   Sensory: Sensation is intact.  ?   Motor: Motor function is intact.  ?   Coordination: Coordination is intact.  ?   Gait: Gait is intact.  ?Psychiatric:     ?   Mood and Affect: Mood normal.     ?   Behavior: Behavior normal.     ?   Thought Content:  Thought content normal.     ?   Judgment: Judgment normal.  ? ? ? ?UC Treatments / Results  ?Labs ?(all labs ordered are listed, but only abnormal results are displayed) ?Labs Reviewed - No data to display ? ?EKG ? ? ?Radio

## 2021-07-01 NOTE — ED Triage Notes (Signed)
Patient c/o dental pain x 1 week, left upper tooth pain worse last night.  Patient does not have a Education officer, community or dental insurance.  Thinks tooth may be infected. ?

## 2021-10-11 ENCOUNTER — Ambulatory Visit
Admission: RE | Admit: 2021-10-11 | Discharge: 2021-10-11 | Disposition: A | Discharge status: Home / Self Care | Payer: BC Managed Care – PPO | Source: Ambulatory Visit | Attending: Internal Medicine | Admitting: Internal Medicine

## 2021-10-11 VITALS — BP 155/89 | HR 86 | Temp 98.9°F | Resp 18

## 2021-10-11 DIAGNOSIS — B3731 Acute candidiasis of vulva and vagina: Secondary | ICD-10-CM | POA: Insufficient documentation

## 2021-10-11 DIAGNOSIS — E1165 Type 2 diabetes mellitus with hyperglycemia: Secondary | ICD-10-CM | POA: Diagnosis not present

## 2021-10-11 DIAGNOSIS — N898 Other specified noninflammatory disorders of vagina: Secondary | ICD-10-CM | POA: Diagnosis not present

## 2021-10-11 DIAGNOSIS — R300 Dysuria: Secondary | ICD-10-CM | POA: Insufficient documentation

## 2021-10-11 DIAGNOSIS — Z3202 Encounter for pregnancy test, result negative: Secondary | ICD-10-CM | POA: Diagnosis not present

## 2021-10-11 DIAGNOSIS — E1169 Type 2 diabetes mellitus with other specified complication: Secondary | ICD-10-CM | POA: Diagnosis not present

## 2021-10-11 LAB — POCT URINALYSIS DIP (MANUAL ENTRY)
Bilirubin, UA: NEGATIVE
Glucose, UA: 500 mg/dL — AB
Ketones, POC UA: NEGATIVE mg/dL
Leukocytes, UA: NEGATIVE
Nitrite, UA: NEGATIVE
Protein Ur, POC: 100 mg/dL — AB
Spec Grav, UA: 1.025 (ref 1.010–1.025)
Urobilinogen, UA: 0.2 E.U./dL
pH, UA: 6 (ref 5.0–8.0)

## 2021-10-11 LAB — POCT URINE PREGNANCY: Preg Test, Ur: NEGATIVE

## 2021-10-11 LAB — POCT FASTING CBG KUC MANUAL ENTRY: POCT Glucose (KUC): 298 mg/dL — AB (ref 70–99)

## 2021-10-11 MED ORDER — METFORMIN HCL 1000 MG PO TABS: 500.0000 mg | ORAL_TABLET | Freq: Two times a day (BID) | ORAL | 0 refills | Status: DC

## 2021-10-11 MED ORDER — FLUCONAZOLE 150 MG PO TABS: 150.0000 mg | ORAL_TABLET | ORAL | 0 refills | Status: DC

## 2021-10-11 NOTE — ED Triage Notes (Signed)
C/O dysuria and low abd pain x3 days along with vaginal irritation and itching, and vulvar swelling. STates she is getting frequent yeast infections due to uncontrolled diabetes - is set to see PCP next month.

## 2021-10-11 NOTE — ED Provider Notes (Signed)
[name]    CSN: [id number] Arrival date & time: 10/11/21  1353      History   Chief Complaint Chief Complaint  Patient presents with   Dysuria   Abdominal Pain   Appointment    HPI Kayla Flynn is a 31 y.o. female.   Patient presents with urinary burning, lower abdominal pain, vaginal irritation, vaginal itching, vaginal discharge that started about 3 days ago.  Patient is concerned for vaginal yeast infection given that she knows that her diabetes is currently uncontrolled and she has a history of recurrent yeast infections.  Denies any confirmed exposure or concern for STD.  Last menstrual cycle was in May but patient reports that this is baseline for her as she typically has irregular menstrual cycles.  She used to take metformin for diabetes but has been off of it for multiple months.  She states that she does not take any daily medications currently.  She scheduled an appointment with a new PCP September 8 for further evaluation and management of this.  Denies urinary frequency, back pain, fever.   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, antepartum 07/21/2014   S/P cesarean section 07/14/2014   Obesity, morbid, BMI 82 ([place]) 07/18/2012   DM type 2 (diabetes mellitus, type 2) ([place]) 10/12/2010    Past Surgical History:  Procedure Laterality Date   APPENDECTOMY  07/17/2012   CESAREAN SECTION N/A 12/18/2014   Procedure: CESAREAN SECTION;  Surgeon: Osborne Oman, MD;  Location: [facility];  Service:  Obstetrics;  Laterality: N/A;   LAPAROSCOPIC APPENDECTOMY N/A 07/17/2012   Procedure: APPENDECTOMY LAPAROSCOPIC;  Surgeon: Zenovia Jarred, MD;  Location: [facility];  Service: General;  Laterality: N/A;    OB History     Gravida  1   Para  1   Term  1   Preterm      AB      Living  1      SAB      IAB      Ectopic      Multiple  0   Live Births  1            Home Medications    Prior to Admission medications   Medication Sig Start Date End Date Taking? Authorizing Provider  fluconazole (DIFLUCAN) 150 MG tablet Take 1 tablet (150 mg total) by mouth every 3 (three) days. Take first pill today.  May take second pill in 3 days if no resolution of symptoms. 10/11/21  Yes Nathan Stallworth, Michele Rockers, FNP  metFORMIN (GLUCOPHAGE) 1000 MG tablet Take 0.5 tablets (500 mg total) by mouth 2 (two) times daily. 10/11/21  Yes Mercy Malena, Michele Rockers, FNP  Accu-Chek FastClix Lancets MISC Use to check FSBS twice a day. Dx: E11.65 Z79.4 12/05/19   Nicolette Bang, MD  amoxicillin-clavulanate (AUGMENTIN) 875-125 MG tablet Take 1 tablet by mouth every 12 (twelve) hours. 07/01/21   Teodora Medici, FNP  baclofen (LIORESAL) 10 MG tablet Take 1 tablet (10 mg  total) by mouth 2 (two) times daily as needed for muscle spasms. 10/16/20   Raspet, Derry Skill, PA-C  benzonatate (TESSALON) 100 MG capsule Take 1-2 capsules (100-200 mg total) by mouth 3 (three) times daily as needed for cough. 04/06/21   Jaynee Eagles, PA-C  Blood Glucose Monitoring Suppl (ACCU-CHEK GUIDE ME) w/Device KIT Use to check FSBS twice a day. Dx: E11.65 Z79.4 12/05/19   Nicolette Bang, MD  glucose blood (ACCU-CHEK GUIDE) test strip Use to check FSBS twice a day. Dx: E11.65 Z79.4 12/05/19   Nicolette Bang, MD  ibuprofen (ADVIL) 600 MG tablet Take 1 tablet (600 mg total) by mouth every 6 (six) hours as needed for mild pain or moderate pain. 11/27/20   Teodora Medici, FNP  Insulin Pen Needle (B-D UF III MINI PEN NEEDLES) 31G X 5 MM MISC  Use as instructed. Inject into the skin once daily  E11.65 Z79.4 02/12/19   Gildardo Pounds, NP  ipratropium (ATROVENT) 0.03 % nasal spray Place 2 sprays into both nostrils 2 (two) times daily. 04/06/21   Jaynee Eagles, PA-C  Lancets Misc. (ACCU-CHEK FASTCLIX LANCET) KIT Use to check FSBS twice a day. Dx: E11.65 Z79.4 12/05/19   Nicolette Bang, MD  levocetirizine (XYZAL) 5 MG tablet Take 1 tablet (5 mg total) by mouth every evening. 04/06/21   Jaynee Eagles, PA-C  lidocaine (XYLOCAINE) 2 % solution Use as directed 15 mLs in the mouth or throat as needed for mouth pain. Swish and spit 03/10/21   Viraj Liby, Michele Rockers, FNP  liraglutide (VICTOZA) 18 MG/3ML SOPN Inject 1.8 mg into the skin daily. 12/05/19   Nicolette Bang, MD  lisinopril (ZESTRIL) 5 MG tablet Take 1 tablet (5 mg total) by mouth daily. 02/12/19   Gildardo Pounds, NP  metroNIDAZOLE (FLAGYL) 500 MG tablet Take 1 tablet (500 mg total) by mouth 2 (two) times daily. 03/18/21   LampteyMyrene Galas, MD  Norgestimate-Ethinyl Estradiol Triphasic (TRI-LO-SPRINTEC) 0.18/0.215/0.25 MG-25 MCG tab Take 1 tablet by mouth daily. 05/07/21   Francene Finders, PA-C  promethazine-dextromethorphan (PROMETHAZINE-DM) 6.25-15 MG/5ML syrup Take 5 mLs by mouth at bedtime as needed for cough. 04/06/21   Jaynee Eagles, PA-C    Family History Family History  Problem Relation Age of Onset   Hypertension Mother    Diabetes Mother    Hypertension Maternal Grandmother     Social History Social History   Tobacco Use   Smoking status: Every Day    Packs/day: 0.25    Years: 1.20    Total pack years: 0.30    Types: Cigarettes  Vaping Use   Vaping Use: Never used  Substance Use Topics   Alcohol use: Yes    Comment: socially   Drug use: No     Allergies   Patient has no known allergies.   Review of Systems Review of Systems Per HPI  Physical Exam Triage Vital Signs ED Triage Vitals  Enc Vitals Group     BP 10/11/21 1413 (!) 155/89     Pulse Rate  10/11/21 1413 86     Resp 10/11/21 1413 18     Temp 10/11/21 1413 98.9 F (37.2 C)     Temp Source 10/11/21 1413 Oral     SpO2 10/11/21 1413 95 %     Weight --      Height --      Head Circumference --      Peak Flow --      Pain  Score 10/11/21 1415 5     Pain Loc --      Pain Edu? --      Excl. in [place]? --    No data found.  Updated Vital Signs BP (!) 155/89   Pulse 86   Temp 98.9 F (37.2 C) (Oral)   Resp 18   SpO2 95%   Visual Acuity Right Eye Distance:   Left Eye Distance:   Bilateral Distance:    Right Eye Near:   Left Eye Near:    Bilateral Near:     Physical Exam Constitutional:      General: She is not in acute distress.    Appearance: Normal appearance. She is not toxic-appearing or diaphoretic.  HENT:     Head: Normocephalic and atraumatic.  Eyes:     Extraocular Movements: Extraocular movements intact.     Conjunctiva/sclera: Conjunctivae normal.  Cardiovascular:     Rate and Rhythm: Normal rate and regular rhythm.     Pulses: Normal pulses.     Heart sounds: Normal heart sounds.  Pulmonary:     Effort: Pulmonary effort is normal. No respiratory distress.     Breath sounds: Normal breath sounds.  Abdominal:     General: Bowel sounds are normal. There is no distension.     Palpations: Abdomen is soft.     Tenderness: There is no abdominal tenderness.  Genitourinary:    Comments: Deferred with shared decision-making.  Self swab performed. Neurological:     General: No focal deficit present.     Mental Status: She is alert and oriented to person, place, and time. Mental status is at baseline.  Psychiatric:        Mood and Affect: Mood normal.        Behavior: Behavior normal.        Thought Content: Thought content normal.        Judgment: Judgment normal.      UC Treatments / Results  Labs (all labs ordered are listed, but only abnormal results are displayed) Labs Reviewed  POCT URINALYSIS DIP (MANUAL ENTRY) - Abnormal; Notable for the  following components:      Result Value   Color, UA straw (*)    Clarity, UA cloudy (*)    Glucose, UA =500 (*)    Blood, UA large (*)    Protein Ur, POC =100 (*)    All other components within normal limits  POCT FASTING CBG K[facility] MANUAL ENTRY - Abnormal; Notable for the following components:   POCT Glucose ([facility]) 298 (*)    All other components within normal limits  POCT URINE PREGNANCY  CERVICOVAGINAL ANCILLARY ONLY    EKG   Radiology No results found.  Procedures Procedures (including critical care time)  Medications Ordered in UC Medications - No data to display  Initial Impression / Assessment and Plan / UC Course  I have reviewed the triage vital signs and the nursing notes.  Pertinent labs & imaging results that were available during my care of the patient were reviewed by me and considered in my medical decision making (see chart for details).     Highly suspicious of vaginal yeast infection given patient's uncontrolled diabetes and history of recurrent vaginal yeast infections.  Will opt to treat with Diflucan.  Cervicovaginal swab pending.  Will await results for any further treatment.  Urinalysis not showing signs of urinary tract infection.  There is glucose in urine.  Blood glucose here in urgent care was 298.  I do think it would be reasonable to restart metformin given patient has hyperglycemia and has been off metformin for multiple months.  Will send 30-day supply since patient has appointment with PCP in 1 month.  Advised patient to monitor blood sugar very closely while at home and encouraged the importance of the follow-up appointment with PCP.  Urine pregnancy was negative.  Patient to follow-up if symptoms persist or worsen.  Patient verbalized understanding and was agreeable with plan. Final Clinical Impressions(s) / UC Diagnoses   Final diagnoses:  Vaginal discharge  Dysuria  Type 2 diabetes mellitus with hyperglycemia, without long-term current use of  insulin ([place])     Discharge Instructions      I am suspicious of a yeast infection so you are being treated with Diflucan pills.  Your vaginal swab is pending.  We will call if it is abnormal.  We ask that you refrain from sexual activity until test results and treatment are complete.  I have restarted your metformin as well.  Please monitor your blood sugar very closely at home.  Follow-up with PCP for further evaluation and management.    ED Prescriptions     Medication Sig Dispense Auth. Provider   metFORMIN (GLUCOPHAGE) 1000 MG tablet Take 0.5 tablets (500 mg total) by mouth 2 (two) times daily. 30 tablet [place], [place], [place]   fluconazole (DIFLUCAN) 150 MG tablet Take 1 tablet (150 mg total) by mouth every 3 (three) days. Take first pill today.  May take second pill in 3 days if no resolution of symptoms. 2 tablet [place], Michele Rockers, [place]      PDMP not reviewed this encounter.   Teodora Medici,  10/11/21 [phone]

## 2021-10-11 NOTE — Discharge Instructions (Signed)
I am suspicious of a yeast infection so you are being treated with Diflucan pills.  Your vaginal swab is pending.  We will call if it is abnormal.  We ask that you refrain from sexual activity until test results and treatment are complete.  I have restarted your metformin as well.  Please monitor your blood sugar very closely at home.  Follow-up with PCP for further evaluation and management.

## 2021-10-12 LAB — CERVICOVAGINAL ANCILLARY ONLY
Bacterial Vaginitis (gardnerella): POSITIVE — AB
Candida Glabrata: NEGATIVE
Candida Vaginitis: POSITIVE — AB
Comment: NEGATIVE
Comment: NEGATIVE
Comment: NEGATIVE

## 2021-10-13 ENCOUNTER — Telehealth (HOSPITAL_COMMUNITY): Payer: Self-pay | Admitting: Emergency Medicine

## 2021-10-13 MED ORDER — FLUCONAZOLE 150 MG PO TABS: 150.0000 mg | ORAL_TABLET | Freq: Once | ORAL | 0 refills | Status: AC

## 2021-10-13 MED ORDER — METRONIDAZOLE 500 MG PO TABS: 500.0000 mg | ORAL_TABLET | Freq: Two times a day (BID) | ORAL | 0 refills | Status: DC

## 2021-10-13 NOTE — Telephone Encounter (Signed)
[facility] on back [facility], sending to [facility]

## 2021-11-02 NOTE — Progress Notes (Signed)
Subjective:    Kayla Flynn - 31 y.o. female MRN [id number]  Date of birth: 1990-12-12  HPI  Reis Pienta is to establish care.   Current issues and/or concerns: 10/11/2021 [facility] per NP note: Highly suspicious of vaginal yeast infection given patient's uncontrolled diabetes and history of recurrent vaginal yeast infections.  Will opt to treat with Diflucan.  Cervicovaginal swab pending.  Will await results for any further treatment.  Urinalysis not showing signs of urinary tract infection.  There is glucose in urine.  Blood glucose here in urgent care was 298.  I do think it would be reasonable to restart metformin given patient has hyperglycemia and has been off metformin for multiple months.  Will send 30-day supply since patient has appointment with PCP in 1 month.  Advised patient to monitor blood sugar very closely while at home and encouraged the importance of the follow-up appointment with PCP.  Urine pregnancy was negative.  Patient to follow-up if symptoms persist or worsen.  Patient verbalized understanding and was agreeable with plan.  Today's visit 11/12/2021: - Doing well on Metformin without issues or concerns. Reports sometimes she forgets to take. She is trying to monitor what she eats and would like to lose weight. Noticed increased weight gain after delivering her baby in 2016. She is not checking blood sugars at home. - History of frequent yeast infections and thinks related to diabetes. She would like to see a gynecologist for further workup. - In the past was taking Lisinopril for high blood pressure but quit due to needing refills. She is not checking blood pressures at home.  - Smoking around 7 to 8 cigarettes daily. She is ready to quit.  - Endorses shortness of breath for months intermittently without additional red flag symptoms.  - History of seasonal depression. No current thoughts of self-harm, suicidal ideations, or homicidal ideations.  Reports she does have mood swings. However, when she is able to have a moment alone to decompress such as getting on her phone she is usually able to recenter.  - Sinus congestion and phlegm. Over-the-counter regimen helping to keep stable.  - Reports her boyfriend says she snores. She would like a sleep study.     ROS per HPI    Health Maintenance:  Health Maintenance Due  Topic Date Due   COVID-19 Vaccine (1) Never done   OPHTHALMOLOGY EXAM  Never done   HPV VACCINES (3 - 3-dose series) 08/13/2009   PAP SMEAR-Modifier  Never done   HEMOGLOBIN A1C  06/03/2020   FOOT EXAM  12/04/2020     Past Medical History: Patient Active Problem List   Diagnosis Date Noted   Moderate episode of recurrent major depressive disorder ([place]) 11/12/2021   Essential hypertension 11/12/2021   Morbid obesity ([place]) 11/12/2021   Tobacco abuse 11/12/2021   Hyperlipidemia 12/05/2019   Benign essential HTN 01/28/2015   History of syphilis 08/11/2014   Maternal varicella, non-immune 07/21/2014   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 She has a 0.30 pack-year smoking history. She has been exposed to tobacco smoke. She has never used smokeless tobacco. She reports current alcohol use. She reports that she does not use drugs.  Family History  family history includes Diabetes in her mother; Hypertension in her maternal grandmother and mother.   Medications: reviewed and updated   Objective:   Physical Exam BP 138/79 (BP Location: Left Arm, Patient Position: Sitting, Cuff Size: Large)   Pulse 83   Temp 98.3 F (36.8 C)   Resp 16   Ht 4' 11.06" (1.5 m)   Wt (!) 355 lb (161 kg)   SpO2 92%   BMI 71.57 kg/m    Physical Exam HENT:     Head: Normocephalic and atraumatic.  Eyes:     Extraocular Movements: Extraocular movements intact.     Conjunctiva/sclera: Conjunctivae normal.     Pupils: Pupils are equal, round, and reactive to light.  Cardiovascular:     Rate and Rhythm: Normal rate and regular rhythm.     Pulses: Normal pulses.     Heart sounds: Normal heart sounds.  Pulmonary:     Effort: Pulmonary effort is normal.     Breath sounds: Normal breath sounds.  Musculoskeletal:     Cervical back: Normal range of motion and neck supple.  Neurological:     General: No focal deficit present.     Mental Status: She is alert and oriented to person, place, and time.  Psychiatric:        Mood and Affect: Mood normal.        Behavior: Behavior normal.       Assessment & Plan:  1. Encounter to establish care - Patient presents to reestablish care from 2021. Return to for annual physical exam with primary care provider.     2. Type 2 diabetes mellitus without complication, without long-term current use of insulin ([facility]) - Continue Metformin as prescribed.  - Hemoglobin A1c sent to lab, result pending.  - Discussed the importance of healthy eating habits, low-carbohydrate diet, low-sugar diet, regular aerobic exercise (at least 150 minutes a week as tolerated) and medication compliance to achieve or maintain control of diabetes. - Update BMP.  - Follow-up with primary provider as scheduled.  - Basic Metabolic Panel - Hemoglobin A1c - metFORMIN (GLUCOPHAGE) 1000 MG tablet; Take 0.5 tablets (500 mg total) by mouth 2 (two) times daily.  Dispense: 30 tablet; Refill: 1 - Blood Glucose Monitoring Suppl (ACCU-CHEK GUIDE ME) w/Device KIT; 1 kit by Other route 4 (four) times daily -  before meals and at bedtime. Dispense: 1 kit; Refill: 0 - Accu-Chek FastClix Lancets MISC; 1 each by Other route 4 (four) times daily -  before meals and at bedtime. Dispense: 100 each; Refill: 4 - glucose blood  (ACCU-CHEK GUIDE) test strip; 1 each by Other route 4 (four) times daily -  before meals and at bedtime. Dispense: 100 each; Refill: 4  3. Primary hypertension - Resume low dose Lisinopril. Counseled on medication adherence and adverse effects.  - Counseled on blood pressure goal of less than 130/80, low-sodium, DASH diet, medication compliance, and 150 minutes of moderate intensity exercise per week as tolerated. Counseled on medication adherence and adverse effects. - Follow-up with primary provider in 4 weeks or sooner if needed.  - lisinopril (ZESTRIL) 5 MG tablet; Take 1 tablet (5 mg total) by mouth daily.  Dispense: 30 tablet; Refill: 1  4. Mood swings 5. History of depression - Patient denies thoughts of self-harm, suicidal ideations, homicidal ideations. - Referral to Psychiatry for further evaluation and management. - Ambulatory referral to Psychiatry  6. Snoring - Sleep study for further evaluation.  - PSG Sleep Study;  Future  7. Encounter for weight management - Referral to Medical Nutrition Therapy for further evaluation and management.  - Amb ref to Medical Nutrition Therapy-MNT  8. Encounter for smoking cessation counseling - Nicotine polacrilex and nicotine patch as prescribed. Counseled on medication adherence and adverse effects.  - Follow-up with primary provider in 4 weeks or sooner if needed.  - nicotine polacrilex (NICORETTE) 2 MG gum; Chew 1 piece of gum every 2 to 4 hours (maximum: 24 pieces/day).  Dispense: 100 tablet; Refill: 1 - nicotine (NICODERM CQ - DOSED IN MG/24 HOURS) 14 mg/24hr patch; Place 1 patch (14 mg total) onto the skin daily.  Dispense: 28 patch; Refill: 2  9. Candida vaginitis - Recurrent.  - Referral to Gynecology for further evaluation and management.  - Ambulatory referral to Gynecology  10. Dyspnea, unspecified type - Patient today in office without cardiopulmonary distress.  - Discussed symptoms may be secondary to smoking (see #8). -  Referral to Pulmonology for further evaluation and management. - Ambulatory referral to Pulmonology  11. Phlegm in throat 12. Sinus congestion - Continue with over-the-counter regimen.  - Follow-up with primary provider as scheduled.    Patient was given clear instructions to go to Emergency Department or return to medical center if symptoms don't improve, worsen, or new problems develop.The patient verbalized understanding.  I discussed the assessment and treatment plan with the patient. The patient was provided an opportunity to ask questions and all were answered. The patient agreed with the plan and demonstrated an understanding of the instructions.   The patient was advised to call back or seek an in-person evaluation if the symptoms worsen or if the condition fails to improve as anticipated.    Durene Fruits, NP 11/12/2021, 8:29 PM Primary Care at [facility]

## 2021-11-12 ENCOUNTER — Encounter: Payer: Self-pay | Admitting: Family Medicine

## 2021-11-12 ENCOUNTER — Ambulatory Visit (INDEPENDENT_AMBULATORY_CARE_PROVIDER_SITE_OTHER): Payer: BC Managed Care – PPO | Admitting: Family

## 2021-11-12 ENCOUNTER — Encounter: Payer: Self-pay | Admitting: Family

## 2021-11-12 VITALS — BP 138/79 | HR 83 | Temp 98.3°F | Resp 16 | Ht 59.06 in | Wt 355.0 lb

## 2021-11-12 DIAGNOSIS — F331 Major depressive disorder, recurrent, moderate: Secondary | ICD-10-CM | POA: Insufficient documentation

## 2021-11-12 DIAGNOSIS — R4586 Emotional lability: Secondary | ICD-10-CM | POA: Diagnosis not present

## 2021-11-12 DIAGNOSIS — Z716 Tobacco abuse counseling: Secondary | ICD-10-CM | POA: Diagnosis not present

## 2021-11-12 DIAGNOSIS — Z720 Tobacco use: Secondary | ICD-10-CM | POA: Insufficient documentation

## 2021-11-12 DIAGNOSIS — Z7689 Persons encountering health services in other specified circumstances: Secondary | ICD-10-CM

## 2021-11-12 DIAGNOSIS — I10 Essential (primary) hypertension: Secondary | ICD-10-CM | POA: Diagnosis not present

## 2021-11-12 DIAGNOSIS — Z8659 Personal history of other mental and behavioral disorders: Secondary | ICD-10-CM

## 2021-11-12 DIAGNOSIS — Z6841 Body Mass Index (BMI) 40.0 and over, adult: Secondary | ICD-10-CM

## 2021-11-12 DIAGNOSIS — R0981 Nasal congestion: Secondary | ICD-10-CM

## 2021-11-12 DIAGNOSIS — B3731 Acute candidiasis of vulva and vagina: Secondary | ICD-10-CM

## 2021-11-12 DIAGNOSIS — E119 Type 2 diabetes mellitus without complications: Secondary | ICD-10-CM | POA: Diagnosis not present

## 2021-11-12 DIAGNOSIS — R0989 Other specified symptoms and signs involving the circulatory and respiratory systems: Secondary | ICD-10-CM

## 2021-11-12 DIAGNOSIS — R0683 Snoring: Secondary | ICD-10-CM

## 2021-11-12 DIAGNOSIS — R0600 Dyspnea, unspecified: Secondary | ICD-10-CM

## 2021-11-12 MED ORDER — ACCU-CHEK FASTCLIX LANCETS MISC: 1.0000 | Freq: Three times a day (TID) | 4 refills | Status: AC

## 2021-11-12 MED ORDER — LISINOPRIL 5 MG PO TABS: 5.0000 mg | ORAL_TABLET | Freq: Every day | ORAL | 1 refills | Status: DC

## 2021-11-12 MED ORDER — ACCU-CHEK GUIDE ME W/DEVICE KIT: 1.0000 | PACK | Freq: Three times a day (TID) | 0 refills | Status: AC

## 2021-11-12 MED ORDER — ACCU-CHEK GUIDE VI STRP: 1.0000 | ORAL_STRIP | Freq: Three times a day (TID) | 4 refills | Status: DC

## 2021-11-12 MED ORDER — NICOTINE POLACRILEX 2 MG MT GUM: CHEWING_GUM | OROMUCOSAL | 1 refills | Status: AC

## 2021-11-12 MED ORDER — NICOTINE 14 MG/24HR TD PT24: 14.0000 mg | MEDICATED_PATCH | Freq: Every day | TRANSDERMAL | 2 refills | Status: DC

## 2021-11-12 MED ORDER — ACCU-CHEK GUIDE VI STRP: 1.0000 | ORAL_STRIP | Freq: Three times a day (TID) | 4 refills | Status: AC

## 2021-11-12 MED ORDER — ACCU-CHEK FASTCLIX LANCETS MISC: 1.0000 | Freq: Three times a day (TID) | 4 refills | Status: DC

## 2021-11-12 MED ORDER — METFORMIN HCL 1000 MG PO TABS: 500.0000 mg | ORAL_TABLET | Freq: Two times a day (BID) | ORAL | 1 refills | Status: DC

## 2021-11-12 MED ORDER — ACCU-CHEK GUIDE ME W/DEVICE KIT: 1.0000 | PACK | Freq: Three times a day (TID) | 0 refills | Status: DC

## 2021-11-12 NOTE — Patient Instructions (Signed)
Metformin Tablets What is this medication? METFORMIN (met FOR min) treats type 2 diabetes. It controls blood sugar (glucose) and helps your body use insulin effectively. This medication is often combined with changes to diet and exercise. This medicine may be used for other purposes; ask your health care provider or pharmacist if you have questions. COMMON BRAND NAME(S): Glucophage What should I tell my care team before I take this medication? They need to know if you have any of these conditions: Anemia Dehydration Heart disease If you often drink alcohol Kidney disease Liver disease Polycystic ovary syndrome Serious infection or injury Vomiting An unusual or allergic reaction to metformin, other medications, foods, dyes, or preservatives Pregnant or trying to get pregnant Breast-feeding How should I use this medication? Take this medication by mouth with a glass of water. Follow the directions on the prescription label. Take this medication with food. Take your medication at regular intervals. Do not take your medication more often than directed. Do not stop taking except on your care team's advice. Talk to your care team about the use of this medication in children. While this medication may be prescribed for children as young as 24 years of age for selected conditions, precautions do apply. Overdosage: If you think you have taken too much of this medicine contact a poison control center or emergency room at once. NOTE: This medicine is only for you. Do not share this medicine with others. What if I miss a dose? If you miss a dose, take it as soon as you can. If it is almost time for your next dose, take only that dose. Do not take double or extra doses. What may interact with this medication? Do not take this medication with any of the following: Certain contrast medications given before X-rays, CT scans, MRI, or other procedures Dofetilide This medication may also interact with the  following: Acetazolamide Alcohol Certain antivirals for HIV or hepatitis Certain medications for blood pressure, heart disease, irregular heart beat Cimetidine Dichlorphenamide Digoxin Diuretics Estrogens, progestins, or birth control pills Glycopyrrolate Isoniazid Lamotrigine Memantine Methazolamide Metoclopramide Midodrine Niacin Phenothiazines like chlorpromazine, mesoridazine, prochlorperazine, thioridazine Phenytoin Ranolazine Steroid medications like prednisone or cortisone Stimulant medications for attention disorders, weight loss, or to stay awake Thyroid medications Topiramate Trospium Vandetanib Zonisamide This list may not describe all possible interactions. Give your health care provider a list of all the medicines, herbs, non-prescription drugs, or dietary supplements you use. Also tell them if you smoke, drink alcohol, or use illegal drugs. Some items may interact with your medicine. What should I watch for while using this medication? Visit your care team for regular checks on your progress. A test called the HbA1C (A1C) will be monitored. This is a simple blood test. It measures your blood sugar control over the last 2 to 3 months. You will receive this test every 3 to 6 months. Using this medication with insulin or a sulfonylurea may increase your risk of hypoglycemia. Learn how to check your blood sugar. Learn the symptoms of low and high blood sugar and how to manage them. Always carry a quick-source of sugar with you in case you have symptoms of low blood sugar. Examples include hard sugar candy or glucose tablets. Make sure others know that you can choke if you eat or drink when you develop serious symptoms of low blood sugar, such as seizures or unconsciousness. They must get medical help at once. Tell your care team if you have high blood sugar. You might need  to change the dose of your medication. If you are sick or exercising more than usual, you might need  to change the dose of your medication. Do not skip meals. Ask your care team if you should avoid alcohol. Many nonprescription cough and cold products contain sugar or alcohol. These can affect blood sugar. This medication may cause ovulation in premenopausal women who do not have regular monthly periods. This may increase your chances of becoming pregnant. You should not take this medication if you become pregnant or think you may be pregnant. Talk with your care team about your birth control options while taking this medication. Contact your care team right away if you think you are pregnant. If you are going to need surgery, an MRI, CT scan, or other procedure, tell your care team that you are taking this medication. You may need to stop taking this medication before the procedure. Wear a medical ID bracelet or chain, and carry a card that describes your disease and details of your medication and dosage times. This medication may cause a decrease in folic acid and vitamin B12. You should make sure that you get enough vitamins while you are taking this medication. Discuss the foods you eat and the vitamins you take with your care team. What side effects may I notice from receiving this medication? Side effects that you should report to your care team as soon as possible: Allergic reactions--skin rash, itching, hives, swelling of the face, lips, tongue, or throat High lactic acid level--muscle pain or cramps, stomach pain, trouble breathing, general discomfort or fatigue Low vitamin B12 level--pain, tingling, or numbness in the hands or feet, muscle weakness, dizziness, confusion, difficulty concentrating Side effects that usually do not require medical attention (report to your care team if they continue or are bothersome): Diarrhea Gas Headache Metallic taste in mouth Nausea This list may not describe all possible side effects. Call your doctor for medical advice about side effects. You may  report side effects to FDA at 1-800-FDA-1088. Where should I keep my medication? Keep out of the reach of children and pets. Store at room temperature between 15 and 30 degrees C (59 and 86 degrees F). Protect from moisture and light. Get rid of any unused medication after the expiration date. To get rid of medications that are no longer needed or expired: Take the medication to a medication take-back program. Check with your pharmacy or law enforcement to find a location. If you cannot return the medication, check the label or package insert to see if the medication should be thrown out in the garbage or flushed down the toilet. If you are not sure, ask your care team. If it is safe to put in the trash, empty the medication out of the container. Mix the medication with cat litter, dirt, coffee grounds, or other unwanted substance. Seal the mixture in a bag or container. Put it in the trash. NOTE: This sheet is a summary. It may not cover all possible information. If you have questions about this medicine, talk to your doctor, pharmacist, or health care provider.  2023 Elsevier/Gold Standard (2020-01-03 00:00:00)

## 2021-11-12 NOTE — Progress Notes (Signed)
.  Pt presents for chronic care management -not seen since 2021 w/Wallace MD   -sinus congestion going on for two weeks   -request referral to gyn

## 2021-11-13 LAB — BASIC METABOLIC PANEL
BUN/Creatinine Ratio: 17 (ref 9–23)
BUN: 11 mg/dL (ref 6–20)
CO2: 24 mmol/L (ref 20–29)
Calcium: 10.1 mg/dL (ref 8.7–10.2)
Chloride: 100 mmol/L (ref 96–106)
Creatinine, Ser: 0.64 mg/dL (ref 0.57–1.00)
Glucose: 217 mg/dL — ABNORMAL HIGH (ref 70–99)
Potassium: 4.7 mmol/L (ref 3.5–5.2)
Sodium: 141 mmol/L (ref 134–144)
eGFR: 122 mL/min/{1.73_m2} (ref >59)

## 2021-11-13 LAB — HEMOGLOBIN A1C
Est. average glucose Bld gHb Est-mCnc: 246 mg/dL
Hgb A1c MFr Bld: 10.2 % — ABNORMAL HIGH (ref 4.8–5.6)

## 2021-11-17 ENCOUNTER — Encounter: Payer: Self-pay | Admitting: Radiology

## 2021-11-17 ENCOUNTER — Ambulatory Visit (INDEPENDENT_AMBULATORY_CARE_PROVIDER_SITE_OTHER): Payer: BC Managed Care – PPO | Admitting: Radiology

## 2021-11-17 VITALS — BP 160/82 | Ht 59.0 in | Wt 353.0 lb

## 2021-11-17 DIAGNOSIS — Z113 Encounter for screening for infections with a predominantly sexual mode of transmission: Secondary | ICD-10-CM

## 2021-11-17 DIAGNOSIS — N761 Subacute and chronic vaginitis: Secondary | ICD-10-CM | POA: Diagnosis not present

## 2021-11-17 LAB — WET PREP FOR TRICH, YEAST, CLUE

## 2021-11-17 NOTE — Progress Notes (Signed)
      Subjective: Kayla Flynn is a 31 y.o. female who complains of recurrent vaginitis. Has been having trouble keeping her glucose controlled and has had many yeast infections recently. She now thinks she may have BV. Sexually active with 1 female partner x 2 years, not using condoms. Would like full STD screen today.   Review of Systems  Genitourinary:  Positive for vaginal discharge.  All other systems reviewed and are negative.   Past Medical History:  Diagnosis Date   Acute appendicitis 07/18/2012   Hypertension    Obesity    Obesity, morbid, BMI 50 or higher (HCC) 07/18/2012   Severe preeclampsia 12/10/2014   Type 2 diabetes mellitus ([facility])       Objective:  Today's Vitals   11/17/21 1009  BP: (!) 160/82  Weight: (!) 353 lb (160.1 kg)  Height: 4\' 11"  (1.499 m)   Body mass index is 71.3 kg/m.   -General: no acute distress, morbidly obese -Vulva: without lesions or discharge -Vagina: discharge present, aptima swab and wet prep obtained -Cervix: no lesion or discharge, no CMT -Perineum: no lesions -Uterus: Mobile, non tender -Adnexa: no masses or tenderness   Microscopic wet-mount exam shows negative for pathogens, normal epithelial cells.   Chaperone offered and declined.  Assessment:/Plan:   1. Screen for STD (sexually transmitted disease)  - SURESWAB CT/NG/T. vaginalis - RPR - HIV antibody (with reflex) - Hepatitis C antibody  2. Subacute vaginitis  - WET PREP FOR TRICH, YEAST, CLUE    Will contact patient with results of testing completed today. Avoid intercourse until symptoms are resolved. Safe sex encouraged. Avoid the use of soaps or perfumed products in the peri area. Avoid tub baths and sitting in sweaty or wet clothing for prolonged periods of time.

## 2021-11-18 LAB — SURESWAB CT/NG/T. VAGINALIS
C. trachomatis RNA, TMA: NOT DETECTED
N. gonorrhoeae RNA, TMA: NOT DETECTED
Trichomonas vaginalis RNA: NOT DETECTED

## 2021-11-18 LAB — HEPATITIS C ANTIBODY: Hepatitis C Ab: NONREACTIVE

## 2021-11-18 LAB — RPR: RPR Ser Ql: NONREACTIVE

## 2021-11-18 LAB — HIV ANTIBODY (ROUTINE TESTING W REFLEX): HIV 1&2 Ab, 4th Generation: NONREACTIVE

## 2021-12-03 ENCOUNTER — Ambulatory Visit
Admission: RE | Admit: 2021-12-03 | Discharge: 2021-12-03 | Disposition: A | Discharge status: Home / Self Care | Payer: BC Managed Care – PPO | Source: Ambulatory Visit | Attending: Physician Assistant | Admitting: Physician Assistant

## 2021-12-03 VITALS — BP 143/81 | HR 89 | Temp 98.6°F | Resp 16

## 2021-12-03 DIAGNOSIS — K529 Noninfective gastroenteritis and colitis, unspecified: Secondary | ICD-10-CM

## 2021-12-03 LAB — POCT URINE PREGNANCY: Preg Test, Ur: NEGATIVE

## 2021-12-03 MED ORDER — ONDANSETRON 4 MG PO TBDP: 4.0000 mg | ORAL_TABLET | Freq: Three times a day (TID) | ORAL | 0 refills | Status: DC | PRN

## 2021-12-03 NOTE — ED Provider Notes (Signed)
[name]    CSN: [id number] Arrival date & time: 12/03/21  1503      History   Chief Complaint Chief Complaint  Patient presents with   Nausea    Nausea, stomachache - Entered by patient    HPI Kayla Flynn is a 31 y.o. female.   Patient here today for evaluation of nausea, vomiting and lack of appetite that she has had for 3 days.  She reports her last episode of vomiting was yesterday but she has had diarrhea today.  She denies any blood in her stool or dark tarry stools.  She has not had fever.  She denies any abdominal pain.  The history is provided by the patient.    Past Medical History:  Diagnosis Date   Acute appendicitis 07/18/2012   Hypertension    Obesity    Obesity, morbid, BMI 50 or higher ([place]) 07/18/2012   Severe preeclampsia 12/10/2014   Type 2 diabetes mellitus [facility])     Patient Active Problem List   Diagnosis Date Noted   Moderate episode of recurrent major depressive disorder ([place]) 11/12/2021   Essential hypertension 11/12/2021   Morbid obesity ([place]) 11/12/2021   Tobacco abuse 11/12/2021   Hyperlipidemia 12/05/2019   Benign essential HTN 01/28/2015   History of syphilis 08/11/2014   Maternal varicella, non-immune 07/21/2014   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, antepartum 07/21/2014   S/P cesarean section 07/14/2014   Obesity, morbid, BMI 82 ([place]) 07/18/2012   Type 2 diabetes mellitus without complication, without long-term current use of insulin ([place]) 10/12/2010    Past Surgical History:  Procedure Laterality Date   APPENDECTOMY  07/17/2012   CESAREAN SECTION N/A 12/18/2014   Procedure: CESAREAN SECTION;  Surgeon: Osborne Oman, MD;  Location: [facility];  Service: Obstetrics;  Laterality: N/A;   LAPAROSCOPIC APPENDECTOMY N/A 07/17/2012   Procedure: APPENDECTOMY LAPAROSCOPIC;  Surgeon: Zenovia Jarred, MD;  Location: [place];  Service: General;  Laterality: N/A;    OB History     Gravida  1   Para  1    Term  1   Preterm      AB  0   Living  1      SAB  0   IAB      Ectopic  0   Multiple  0   Live Births  1            Home Medications    Prior to Admission medications   Medication Sig Start Date End Date Taking? Authorizing Provider  ondansetron (ZOFRAN-ODT) 4 MG disintegrating tablet Take 1 tablet (4 mg total) by mouth every 8 (eight) hours as needed. 12/03/21  Yes Francene Finders, PA-C  Accu-Chek FastClix Lancets MISC 1 each by Other route 4 (four) times daily -  before meals and at bedtime. 11/12/21   Camillia Herter, NP  Blood Glucose Monitoring Suppl (ACCU-CHEK GUIDE ME) w/Device KIT 1 kit by Other route 4 (four) times daily -  before meals and at bedtime. 11/12/21   Camillia Herter, NP  glucose blood (ACCU-CHEK GUIDE) test strip 1 each by Other route 4 (four) times daily -  before meals and at bedtime. 11/12/21   Camillia Herter, NP  ibuprofen (ADVIL) 600 MG tablet Take 1 tablet (600 mg total) by mouth every 6 (six) hours as needed for mild pain or moderate pain. 11/27/20   Teodora Medici, FNP  Insulin Pen Needle (B-D UF III MINI PEN  NEEDLES) 31G X 5 MM MISC Use as instructed. Inject into the skin once daily  E11.65 Z79.4 02/12/19   Gildardo Pounds, NP  ipratropium (ATROVENT) 0.03 % nasal spray Place 2 sprays into both nostrils 2 (two) times daily. 04/06/21   Jaynee Eagles, PA-C  Lancets Misc. (ACCU-CHEK FASTCLIX LANCET) KIT Use to check FSBS twice a day. Dx: E11.65 Z79.4 12/05/19   Nicolette Bang, MD  lisinopril (ZESTRIL) 5 MG tablet Take 1 tablet (5 mg total) by mouth daily. 11/12/21 01/11/22  Camillia Herter, NP  metFORMIN (GLUCOPHAGE) 1000 MG tablet Take 0.5 tablets (500 mg total) by mouth 2 (two) times daily. 11/12/21   Camillia Herter, NP  nicotine (NICODERM CQ - DOSED IN MG/24 HOURS) 14 mg/24hr patch Place 1 patch (14 mg total) onto the skin daily. 11/12/21   Camillia Herter, NP  nicotine polacrilex (NICORETTE) 2 MG gum Chew 1 piece of gum every 2 to 4 hours (maximum:  24 pieces/day). 11/12/21   Camillia Herter, NP    Family History Family History  Problem Relation Age of Onset   Hypertension Mother    Diabetes Mother    Hypertension Maternal Grandmother     Social History Social History   Tobacco Use   Smoking status: Every Day    Packs/day: 0.25    Years: 1.20    Total pack years: 0.30    Types: Cigarettes    Passive exposure: Current   Smokeless tobacco: Never  Vaping Use   Vaping Use: Never used  Substance Use Topics   Alcohol use: Yes    Comment: socially   Drug use: No     Allergies   Patient has no known allergies.   Review of Systems Review of Systems  Constitutional:  Negative for chills and fever.  HENT:  Negative for congestion.   Eyes:  Negative for discharge and redness.  Respiratory:  Negative for shortness of breath.   Gastrointestinal:  Positive for diarrhea, nausea and vomiting. Negative for abdominal pain and blood in stool.     Physical Exam Triage Vital Signs ED Triage Vitals  Enc Vitals Group     BP 12/03/21 1538 (!) 143/81     Pulse Rate 12/03/21 1538 89     Resp 12/03/21 1538 16     Temp 12/03/21 1538 98.6 F (37 C)     Temp Source 12/03/21 1538 Oral     SpO2 12/03/21 1538 94 %     Weight --      Height --      Head Circumference --      Peak Flow --      Pain Score 12/03/21 1539 0     Pain Loc --      Pain Edu? --      Excl. in [place]? --    No data found.  Updated Vital Signs BP (!) 143/81 (BP Location: Left Arm)   Pulse 89   Temp 98.6 F (37 C) (Oral)   Resp 16   LMP 11/09/2021 (Exact Date)   SpO2 94%      Physical Exam Vitals and nursing note reviewed.  Constitutional:      General: She is not in acute distress.    Appearance: Normal appearance. She is not ill-appearing.  HENT:     Head: Normocephalic and atraumatic.  Eyes:     Conjunctiva/sclera: Conjunctivae normal.  Cardiovascular:     Rate and Rhythm: Normal rate.  Pulmonary:  Effort: Pulmonary effort is normal. No  respiratory distress.  Neurological:     Mental Status: She is alert.  Psychiatric:        Mood and Affect: Mood normal.        Behavior: Behavior normal.        Thought Content: Thought content normal.      UC Treatments / Results  Labs (all labs ordered are listed, but only abnormal results are displayed) Labs Reviewed  POCT URINE PREGNANCY    EKG   Radiology No results found.  Procedures Procedures (including critical care time)  Medications Ordered in UC Medications - No data to display  Initial Impression / Assessment and Plan / UC Course  I have reviewed the triage vital signs and the nursing notes.  Pertinent labs & imaging results that were available during my care of the patient were reviewed by me and considered in my medical decision making (see chart for details).    Zofran prescribed for suspected gastroenteritis.  Recommended bland diet and increase fluids.  Pregnancy test negative in office.  Recommended follow-up if no gradual improvement or with any further concerns.  Final Clinical Impressions(s) / UC Diagnoses   Final diagnoses:  Gastroenteritis   Discharge Instructions   None    ED Prescriptions     Medication Sig Dispense Auth. Provider   ondansetron (ZOFRAN-ODT) 4 MG disintegrating tablet Take 1 tablet (4 mg total) by mouth every 8 (eight) hours as needed. 20 tablet Francene Finders, PA-C      PDMP not reviewed this encounter.   Francene Finders, PA-C 12/03/21 [id number]

## 2021-12-03 NOTE — ED Triage Notes (Signed)
Pt states nausea and no appetite for the past 3 days. States she vomited once yesterday.

## 2021-12-09 ENCOUNTER — Other Ambulatory Visit (HOSPITAL_COMMUNITY)
Admission: RE | Admit: 2021-12-09 | Discharge: 2021-12-09 | Disposition: A | Discharge status: Home / Self Care | Payer: BC Managed Care – PPO | Source: Ambulatory Visit | Attending: Radiology | Admitting: Radiology

## 2021-12-09 ENCOUNTER — Encounter: Payer: Self-pay | Admitting: Radiology

## 2021-12-09 ENCOUNTER — Ambulatory Visit (INDEPENDENT_AMBULATORY_CARE_PROVIDER_SITE_OTHER): Payer: BC Managed Care – PPO | Admitting: Radiology

## 2021-12-09 VITALS — BP 192/124 | Ht 59.5 in | Wt 355.0 lb

## 2021-12-09 DIAGNOSIS — R030 Elevated blood-pressure reading, without diagnosis of hypertension: Secondary | ICD-10-CM | POA: Diagnosis not present

## 2021-12-09 DIAGNOSIS — Z01419 Encounter for gynecological examination (general) (routine) without abnormal findings: Secondary | ICD-10-CM | POA: Insufficient documentation

## 2021-12-09 NOTE — Progress Notes (Signed)
Kayla Flynn 12/13/1990 [id number]   History:  31 y.o. G1P1 presents for annual exam. No new concerns. BP elevated today, did not take her BP meds yet, reports she was rushing to take her son to school and get here on time. Has a slight headache.  Gynecologic History Patient's last menstrual period was 11/09/2021 (exact date). Period Duration (Days): 10 Period Pattern: (!) Irregular (irregular, flow varies 5-10 days) Menstrual Flow: Heavy Menstrual Control: Maxi pad Dysmenorrhea: (!) Severe Dysmenorrhea Symptoms: Cramping Contraception/Family planning: coitus interruptus and condoms Sexually active: yes Last Pap: ?2019. Results were: normal   Obstetric History OB History  Gravida Para Term Preterm AB Living  1 1 1    0 1  SAB IAB Ectopic Multiple Live Births  0   0 0 1    # Outcome Date GA Lbr Len/2nd Weight Sex Delivery Anes PTL Lv  1 Term 12/18/14 [redacted]w[redacted]d  5 lb 15.9 oz (2.72 kg) M CS-High Vert EPI  LIV     The following portions of the patient's history were reviewed and updated as appropriate: allergies, current medications, past family history, past medical history, past social history, past surgical history, and problem list.  Review of Systems Pertinent items noted in HPI and remainder of comprehensive ROS otherwise negative.   Past medical history, past surgical history, family history and social history were all reviewed and documented in the EPIC chart.   Exam:  Vitals:   12/09/21 0751 12/09/21 0758  BP: (!) 176/118 (!) 192/124  Weight: (!) 355 lb (161 kg)   Height: 4' 11.5" (1.511 m)    Body mass index is 70.5 kg/m.  General appearance:  Normal, morbidly obese Thyroid:  Symmetrical, normal in size, without palpable masses or nodularity. Respiratory  Auscultation:  Clear without wheezing or rhonchi Cardiovascular  Auscultation:  Regular rate, without rubs, murmurs or gallops  Edema/varicosities:  Not grossly evident Abdominal  Soft,nontender, without  masses, guarding or rebound.  Liver/spleen:  No organomegaly noted  Hernia:  None appreciated  Skin  Inspection:  Grossly normal Breasts: Examined lying and sitting. Bilateral piercings.  Right: Without masses, retractions, nipple discharge or axillary adenopathy.   Left: Without masses, retractions, nipple discharge or axillary adenopathy. Genitourinary   Inguinal/mons:  Normal without inguinal adenopathy  External genitalia:  Normal appearing vulva with no masses, tenderness, or lesions  BUS/Urethra/Skene's glands:  Normal without masses or exudate  Vagina:  Normal appearing with normal color and discharge, no lesions  Cervix:  Normal appearing without discharge or lesions  Uterus:  Normal in size, shape and contour.  Mobile, nontender  Adnexa/parametria:     Rt: Normal in size, without masses or tenderness.   Lt: Normal in size, without masses or tenderness.  Anus and perineum: Normal   Patient informed chaperone available to be present for breast and pelvic exam. Patient has requested no chaperone to be present. Patient has been advised what will be completed during breast and pelvic exam.   Assessment/Plan:   1. Well woman exam with routine gynecological exam  - Cytology - PAP( [facility])  2. Elevated blood pressure reading Take meds when she gets home, recheck BP in 2 hours F/u with PCP as scheduled next week or sooner prn    Discussed SBE, pap and STI screening as directed/appropriate. Recommend 173mins of exercise weekly, including weight bearing exercise. Encouraged the use of seatbelts and sunscreen. Return in 1 year for annual or as needed.   Rubbie Battiest B WHNP-BC 8:25 AM 12/09/2021

## 2021-12-10 ENCOUNTER — Institutional Professional Consult (permissible substitution): Payer: BC Managed Care – PPO | Admitting: Internal Medicine

## 2021-12-10 NOTE — Progress Notes (Deleted)
   Kayla Flynn, female    DOB: 1990-10-25   MRN: [id number]   Brief patient profile:  ***  *** referred to pulmonary clinic 12/10/2021 by *** for ***        History of Present Illness  12/10/2021  Pulmonary/ 1st office eval/Khalin Royce  No chief complaint on file.    Dyspnea:  *** Cough: *** Sleep: *** SABA use:   Past Medical History:  Diagnosis Date   Acute appendicitis 07/18/2012   Hypertension    Obesity    Obesity, morbid, BMI 50 or higher ([place]) 07/18/2012   Severe preeclampsia 12/10/2014   Type 2 diabetes mellitus ([place])     Outpatient Medications Prior to Visit  Medication Sig Dispense Refill   Accu-Chek FastClix Lancets MISC 1 each by Other route 4 (four) times daily -  before meals and at bedtime. 100 each 4   Blood Glucose Monitoring Suppl (ACCU-CHEK GUIDE ME) w/Device KIT 1 kit by Other route 4 (four) times daily -  before meals and at bedtime. 1 kit 0   glucose blood (ACCU-CHEK GUIDE) test strip 1 each by Other route 4 (four) times daily -  before meals and at bedtime. 100 each 4   ibuprofen (ADVIL) 600 MG tablet Take 1 tablet (600 mg total) by mouth every 6 (six) hours as needed for mild pain or moderate pain. 30 tablet 0   Insulin Pen Needle (B-D UF III MINI PEN NEEDLES) 31G X 5 MM MISC Use as instructed. Inject into the skin once daily  E11.65 Z79.4 90 each 3   Lancets Misc. (ACCU-CHEK FASTCLIX LANCET) KIT Use to check FSBS twice a day. Dx: E11.65 Z79.4 1 kit 0   lisinopril (ZESTRIL) 5 MG tablet Take 1 tablet (5 mg total) by mouth daily. 30 tablet 1   metFORMIN (GLUCOPHAGE) 1000 MG tablet Take 0.5 tablets (500 mg total) by mouth 2 (two) times daily. 30 tablet 1   nicotine (NICODERM CQ - DOSED IN MG/24 HOURS) 14 mg/24hr patch Place 1 patch (14 mg total) onto the skin daily. 28 patch 2   nicotine polacrilex (NICORETTE) 2 MG gum Chew 1 piece of gum every 2 to 4 hours (maximum: 24 pieces/day). 100 tablet 1   No facility-administered medications prior to visit.     Objective:      LMP 11/09/2021 (Exact Date)          Assessment   No problem-specific Assessment & Plan notes found for this encounter.     Christinia Gully, MD 12/10/2021

## 2021-12-13 LAB — CYTOLOGY - PAP
Adequacy: ABSENT
Comment: NEGATIVE
Diagnosis: NEGATIVE
High risk HPV: NEGATIVE

## 2021-12-15 ENCOUNTER — Other Ambulatory Visit: Payer: Self-pay | Admitting: Family

## 2021-12-15 DIAGNOSIS — I10 Essential (primary) hypertension: Secondary | ICD-10-CM

## 2021-12-15 DIAGNOSIS — E119 Type 2 diabetes mellitus without complications: Secondary | ICD-10-CM

## 2021-12-15 NOTE — Telephone Encounter (Signed)
Notes to clinic:  Has appt Friday, please assess if he can have 90 day supply.      Requested Prescriptions  Pending Prescriptions Disp Refills   metFORMIN (GLUCOPHAGE) 1000 MG tablet [Pharmacy Med Name: METFORMIN HCL 1,000 MG TABLET] 90 tablet 1    Sig: TAKE 1/2 TABLET (500 MG TOTAL) BY MOUTH TWICE A DAY     Endocrinology:  Diabetes - Biguanides Failed - 12/15/2021 10:33 AM      Failed - HBA1C is between 0 and 7.9 and within 180 days    Hgb A1c MFr Bld  Date Value Ref Range Status  11/12/2021 10.2 (H) 4.8 - 5.6 % Final    Comment:             Prediabetes: 5.7 - 6.4          Diabetes: >6.4          Glycemic control for adults with diabetes: <7.0          Failed - B12 Level in normal range and within 720 days    No results found for: "VITAMINB12"       Passed - Cr in normal range and within 360 days    Creat  Date Value Ref Range Status  07/14/2014 0.54 0.50 - 1.10 mg/dL Final   Creatinine, Ser  Date Value Ref Range Status  11/12/2021 0.64 0.57 - 1.00 mg/dL Final   Creatinine, Urine  Date Value Ref Range Status  12/10/2014 120.00 mg/dL Final         Passed - eGFR in normal range and within 360 days    GFR calc Af Amer  Date Value Ref Range Status  12/05/2019 138 >59 mL/min/1.73 Final    Comment:    **[facility] currently reports eGFR in compliance with the current**   recommendations of the [employer]. [facility] will   update reporting as new guidelines are published from the NKF-ASN   Task force.    GFR, Estimated  Date Value Ref Range Status  05/17/2021 >60 >60 mL/min Final    Comment:    (NOTE) Calculated using the CKD-EPI Creatinine Equation (2021)    eGFR  Date Value Ref Range Status  11/12/2021 122 >59 mL/min/1.73 Final         Passed - Valid encounter within last 6 months    Recent Outpatient Visits           1 month ago Encounter to establish care   Primary Care at [facility], [place], NP   1 year ago Type 2  diabetes mellitus without complication, without long-term current use of insulin [facility])   Primary Care at [facility], Bayard Beaver, MD   2 years ago Type 2 diabetes mellitus without complication, without long-term current use of insulin [facility])   Primary Care at [facility], Bayard Beaver, MD   2 years ago Type 2 diabetes mellitus without complication, unspecified whether long term insulin use [facility])   Primary Care at [facility], [place], [place]   2 years ago Type 2 diabetes mellitus without complication, unspecified whether long term insulin use [facility])   Primary Care at [place]             In 2 days Dorna Mai, MD Primary Care at [facility] - CBC within normal limits and completed in the last  12 months    WBC  Date Value Ref Range Status  05/17/2021 10.4 4.0 - 10.5 K/uL Final   RBC  Date Value Ref Range Status  05/17/2021 4.19 3.87 - 5.11 MIL/uL Final   Hemoglobin  Date Value Ref Range Status  05/17/2021 12.9 12.0 - 15.0 g/dL Final  05/07/2021 12.6 11.1 - 15.9 g/dL Final   HCT  Date Value Ref Range Status  05/17/2021 39.9 36.0 - 46.0 % Final   Hematocrit  Date Value Ref Range Status  05/07/2021 39.5 34.0 - 46.6 % Final   MCHC  Date Value Ref Range Status  05/17/2021 32.3 30.0 - 36.0 g/dL Final   [facility]  Date Value Ref Range Status  05/17/2021 30.8 26.0 - 34.0 pg Final   MCV  Date Value Ref Range Status  05/17/2021 95.2 80.0 - 100.0 fL Final  05/07/2021 93 79 - 97 fL Final   No results found for: "PLTCOUNTKUC", "LABPLAT", "POCPLA" RDW  Date Value Ref Range Status  05/17/2021 12.3 11.5 - 15.5 % Final  05/07/2021 11.8 11.7 - 15.4 % Final          lisinopril (ZESTRIL) 5 MG tablet [Pharmacy Med Name: LISINOPRIL 5 MG TABLET] 90 tablet 1    Sig: Take 1 tablet (5 mg total) by mouth daily.     Cardiovascular:  ACE Inhibitors Failed - 12/15/2021 10:33 AM      Failed  - Last BP in normal range    BP Readings from Last 1 Encounters:  12/09/21 (!) 192/124         Passed - Cr in normal range and within 180 days    Creat  Date Value Ref Range Status  07/14/2014 0.54 0.50 - 1.10 mg/dL Final   Creatinine, Ser  Date Value Ref Range Status  11/12/2021 0.64 0.57 - 1.00 mg/dL Final   Creatinine, Urine  Date Value Ref Range Status  12/10/2014 120.00 mg/dL Final         Passed - K in normal range and within 180 days    Potassium  Date Value Ref Range Status  11/12/2021 4.7 3.5 - 5.2 mmol/L Final         Passed - Patient is not pregnant      Passed - Valid encounter within last 6 months    Recent Outpatient Visits           1 month ago Encounter to establish care   Primary Care at [facility], Amy J, NP   1 year ago Type 2 diabetes mellitus without complication, without long-term current use of insulin [facility])   Primary Care at [facility], Bayard Beaver, MD   2 years ago Type 2 diabetes mellitus without complication, without long-term current use of insulin [facility])   Primary Care at [facility], Bayard Beaver, MD   2 years ago Type 2 diabetes mellitus without complication, unspecified whether long term insulin use [facility])   Primary Care at [facility], [place], [place]   2 years ago Type 2 diabetes mellitus without complication, unspecified whether long term insulin use [facility])   Primary Care at [place]             In 2 days Dorna Mai, MD Primary Care at [facility]

## 2021-12-17 ENCOUNTER — Encounter: Payer: Self-pay | Admitting: Family Medicine

## 2021-12-17 ENCOUNTER — Ambulatory Visit (INDEPENDENT_AMBULATORY_CARE_PROVIDER_SITE_OTHER): Payer: BC Managed Care – PPO | Admitting: Family Medicine

## 2021-12-17 VITALS — BP 168/84 | HR 79 | Temp 98.0°F | Resp 16 | Wt 354.0 lb

## 2021-12-17 DIAGNOSIS — I10 Essential (primary) hypertension: Secondary | ICD-10-CM

## 2021-12-17 DIAGNOSIS — E119 Type 2 diabetes mellitus without complications: Secondary | ICD-10-CM | POA: Diagnosis not present

## 2021-12-17 MED ORDER — LISINOPRIL 20 MG PO TABS: 20.0000 mg | ORAL_TABLET | Freq: Every day | ORAL | 0 refills | Status: DC

## 2021-12-17 MED ORDER — METFORMIN HCL ER 750 MG PO TB24: 750.0000 mg | ORAL_TABLET | Freq: Two times a day (BID) | ORAL | 0 refills | Status: DC

## 2021-12-17 MED ORDER — GLIPIZIDE 10 MG PO TABS: 10.0000 mg | ORAL_TABLET | Freq: Two times a day (BID) | ORAL | 0 refills | Status: DC

## 2021-12-18 LAB — MICROALBUMIN / CREATININE URINE RATIO
Creatinine, Urine: 114.8 mg/dL
Microalb/Creat Ratio: 159 mg/g creat — ABNORMAL HIGH (ref 0–29)
Microalbumin, Urine: 182.5 ug/mL

## 2021-12-20 NOTE — Progress Notes (Signed)
Establishedw Patient Office Visit  Subjective    Patient ID: Kayla Flynn, female    DOB: 03-10-1990  Age: 31 y.o. MRN: [id number]  CC:  Chief Complaint  Patient presents with   Follow-up   Diabetes    HPI Kayla Flynn presents for review of chronic med issues including diabetes and hypertension.    Outpatient Encounter Medications as of 12/17/2021  Medication Sig   Accu-Chek FastClix Lancets MISC 1 each by Other route 4 (four) times daily -  before meals and at bedtime.   Blood Glucose Monitoring Suppl (ACCU-CHEK GUIDE ME) w/Device KIT 1 kit by Other route 4 (four) times daily -  before meals and at bedtime.   glipiZIDE (GLUCOTROL) 10 MG tablet Take 1 tablet (10 mg total) by mouth 2 (two) times daily before a meal.   glucose blood (ACCU-CHEK GUIDE) test strip 1 each by Other route 4 (four) times daily -  before meals and at bedtime.   ibuprofen (ADVIL) 600 MG tablet Take 1 tablet (600 mg total) by mouth every 6 (six) hours as needed for mild pain or moderate pain.   Insulin Pen Needle (B-D UF III MINI PEN NEEDLES) 31G X 5 MM MISC Use as instructed. Inject into the skin once daily  E11.65 Z79.4   Lancets Misc. (ACCU-CHEK FASTCLIX LANCET) KIT Use to check FSBS twice a day. Dx: E11.65 Z79.4   lisinopril (ZESTRIL) 20 MG tablet Take 1 tablet (20 mg total) by mouth daily.   metFORMIN (GLUCOPHAGE) 1000 MG tablet Take 0.5 tablets (500 mg total) by mouth 2 (two) times daily.   metFORMIN (GLUCOPHAGE-XR) 750 MG 24 hr tablet Take 1 tablet (750 mg total) by mouth 2 (two) times daily with a meal.   nicotine (NICODERM CQ - DOSED IN MG/24 HOURS) 14 mg/24hr patch Place 1 patch (14 mg total) onto the skin daily.   nicotine polacrilex (NICORETTE) 2 MG gum Chew 1 piece of gum every 2 to 4 hours (maximum: 24 pieces/day).   [DISCONTINUED] lisinopril (ZESTRIL) 5 MG tablet Take 1 tablet (5 mg total) by mouth daily.   No facility-administered encounter medications on file as of 12/17/2021.    Past  Medical History:  Diagnosis Date   Acute appendicitis 07/18/2012   Hypertension    Obesity    Obesity, morbid, BMI 50 or higher ([place]) 07/18/2012   Severe preeclampsia 12/10/2014   Type 2 diabetes mellitus [facility])     Past Surgical History:  Procedure Laterality Date   APPENDECTOMY  07/17/2012   CESAREAN SECTION N/A 12/18/2014   Procedure: CESAREAN SECTION;  Surgeon: Osborne Oman, MD;  Location: [place];  Service: Obstetrics;  Laterality: N/A;   LAPAROSCOPIC APPENDECTOMY N/A 07/17/2012   Procedure: APPENDECTOMY LAPAROSCOPIC;  Surgeon: Zenovia Jarred, MD;  Location: [facility];  Service: General;  Laterality: N/A;    Family History  Problem Relation Age of Onset   Hypertension Mother    Diabetes Mother    Hypertension Maternal Grandmother     Social History   Socioeconomic History   Marital status: Single    Spouse name: Not on file   Number of children: Not on file   Years of education: Not on file   Highest education level: Not on file  Occupational History   Not on file  Tobacco Use   Smoking status: Every Day    Packs/day: 0.25    Years: 1.20    Total pack years: 0.30    Types: Cigarettes    Passive  exposure: Current   Smokeless tobacco: Never  Vaping Use   Vaping Use: Never used  Substance and Sexual Activity   Alcohol use: Yes    Comment: socially   Drug use: No   Sexual activity: Yes    Partners: Male    Birth control/protection: None  Other Topics Concern   Not on file  Social History Narrative   Not on file   Social Determinants of Health   Financial Resource Strain: Not on file  Food Insecurity: Not on file  Transportation Needs: Not on file  Physical Activity: Not on file  Stress: Not on file  Social Connections: Not on file  Intimate Partner Violence: Not on file    Review of Systems  All other systems reviewed and are negative.       Objective    BP (!) 168/84   Pulse 79   Temp 98 F (36.7 C) (Oral)   Resp 16   Wt (!) 354 lb  (160.6 kg)   LMP 11/09/2021 (Exact Date)   SpO2 95%   BMI 70.30 kg/m   Physical Exam Vitals and nursing note reviewed.  Constitutional:      General: She is not in acute distress.    Appearance: She is obese.  Cardiovascular:     Rate and Rhythm: Normal rate and regular rhythm.  Pulmonary:     Effort: Pulmonary effort is normal.     Breath sounds: Normal breath sounds.  Abdominal:     Palpations: Abdomen is soft.     Tenderness: There is no abdominal tenderness.  Neurological:     General: No focal deficit present.     Mental Status: She is alert and oriented to person, place, and time.         Assessment & Plan:   1. Type 2 diabetes mellitus without complication, without long-term current use of insulin (HCC) Recent A1c is above goal. Will change metformin to XR  2/2 GI SE and add glipizide to regimen. Discussed dietary and activity options. Referral to [facility] for med management.  - Microalbumin / creatinine urine ratio  2. Essential hypertension Elevated reading. Will increase Lisinopril from 21m to 20 mg daily and monitor  3. Morbid obesity ([place] Discussed dietary and activity options.     Return in about 3 months (around 03/19/2022) for follow up.   WBecky Sax MD

## 2021-12-24 ENCOUNTER — Ambulatory Visit
Admission: RE | Admit: 2021-12-24 | Discharge: 2021-12-24 | Disposition: A | Discharge status: Home / Self Care | Payer: BC Managed Care – PPO | Source: Ambulatory Visit | Attending: Physician Assistant | Admitting: Physician Assistant

## 2021-12-24 VITALS — HR 84 | Temp 98.1°F | Resp 20

## 2021-12-24 DIAGNOSIS — J069 Acute upper respiratory infection, unspecified: Secondary | ICD-10-CM

## 2021-12-24 DIAGNOSIS — J019 Acute sinusitis, unspecified: Secondary | ICD-10-CM | POA: Diagnosis not present

## 2021-12-24 MED ORDER — AMOXICILLIN-POT CLAVULANATE 875-125 MG PO TABS: 1.0000 | ORAL_TABLET | Freq: Two times a day (BID) | ORAL | 0 refills | Status: DC

## 2021-12-24 NOTE — ED Triage Notes (Signed)
Patient presents to UC nasal congestion, head congestion, fatigue since Monday. Concerned with URI. Taking coricidin.   Denies fever.

## 2021-12-24 NOTE — ED Provider Notes (Signed)
[name]    CSN: [id number] Arrival date & time: 12/24/21  1453      History   Chief Complaint Chief Complaint  Patient presents with   Nasal Congestion    Green mucus, fatigue, going on for 3 days, started Tuesday 10/17 - Entered by patient    HPI Kayla Flynn is a 31 y.o. female.   Patient here today for evaluation of nasal congestion, head congestion, fatigue that started Monday.  She reports that she has had some sore throat but this is improved.  She has also had some cough.  She notes now that her mucus is a greenish color when she blows her nose.  She has been taking Coricidin without significant improvement.  She states she did have subjective fever chills on one of the earlier days of illness but did not check temperature.  The history is provided by the patient.    Past Medical History:  Diagnosis Date   Acute appendicitis 07/18/2012   Hypertension    Obesity    Obesity, morbid, BMI 50 or higher ([place]) 07/18/2012   Severe preeclampsia 12/10/2014   Type 2 diabetes mellitus [facility])     Patient Active Problem List   Diagnosis Date Noted   Moderate episode of recurrent major depressive disorder ([place]) 11/12/2021   Essential hypertension 11/12/2021   Morbid obesity ([place]) 11/12/2021   Tobacco abuse 11/12/2021   Hyperlipidemia 12/05/2019   Benign essential HTN 01/28/2015   History of syphilis 08/11/2014   Maternal varicella, non-immune 07/21/2014   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, antepartum ([place]) 07/21/2014   S/P cesarean section 07/14/2014   Obesity, morbid, BMI 82 ([place]) 07/18/2012   Type 2 diabetes mellitus without complication, without long-term current use of insulin ([place]) 10/12/2010    Past Surgical History:  Procedure Laterality Date   APPENDECTOMY  07/17/2012   CESAREAN SECTION N/A 12/18/2014   Procedure: CESAREAN SECTION;  Surgeon: Osborne Oman, MD;  Location: [facility];  Service: Obstetrics;  Laterality: N/A;    LAPAROSCOPIC APPENDECTOMY N/A 07/17/2012   Procedure: APPENDECTOMY LAPAROSCOPIC;  Surgeon: Zenovia Jarred, MD;  Location: [place];  Service: General;  Laterality: N/A;    OB History     Gravida  1   Para  1   Term  1   Preterm      AB  0   Living  1      SAB  0   IAB      Ectopic  0   Multiple  0   Live Births  1            Home Medications    Prior to Admission medications   Medication Sig Start Date End Date Taking? Authorizing Provider  amoxicillin-clavulanate (AUGMENTIN) 875-125 MG tablet Take 1 tablet by mouth every 12 (twelve) hours. 12/24/21  Yes Francene Finders, PA-C  Accu-Chek FastClix Lancets MISC 1 each by Other route 4 (four) times daily -  before meals and at bedtime. 11/12/21   Camillia Herter, NP  Blood Glucose Monitoring Suppl (ACCU-CHEK GUIDE ME) w/Device KIT 1 kit by Other route 4 (four) times daily -  before meals and at bedtime. 11/12/21   Camillia Herter, NP  glipiZIDE (GLUCOTROL) 10 MG tablet Take 1 tablet (10 mg total) by mouth 2 (two) times daily before a meal. 12/17/21   Dorna Mai, MD  glucose blood (ACCU-CHEK GUIDE) test strip 1 each by Other route 4 (four) times daily -  before meals and at bedtime. 11/12/21   Camillia Herter, NP  ibuprofen (ADVIL) 600 MG tablet Take 1 tablet (600 mg total) by mouth every 6 (six) hours as needed for mild pain or moderate pain. 11/27/20   Teodora Medici, FNP  Insulin Pen Needle (B-D UF III MINI PEN NEEDLES) 31G X 5 MM MISC Use as instructed. Inject into the skin once daily  E11.65 Z79.4 02/12/19   Gildardo Pounds, NP  Lancets Misc. (ACCU-CHEK FASTCLIX LANCET) KIT Use to check FSBS twice a day. Dx: E11.65 Z79.4 12/05/19   Nicolette Bang, MD  lisinopril (ZESTRIL) 20 MG tablet Take 1 tablet (20 mg total) by mouth daily. 12/17/21   Dorna Mai, MD  metFORMIN (GLUCOPHAGE) 1000 MG tablet Take 0.5 tablets (500 mg total) by mouth 2 (two) times daily. 11/12/21   Camillia Herter, NP  metFORMIN (GLUCOPHAGE-XR)  750 MG 24 hr tablet Take 1 tablet (750 mg total) by mouth 2 (two) times daily with a meal. 12/17/21   Dorna Mai, MD  nicotine (NICODERM CQ - DOSED IN MG/24 HOURS) 14 mg/24hr patch Place 1 patch (14 mg total) onto the skin daily. 11/12/21   Camillia Herter, NP  nicotine polacrilex (NICORETTE) 2 MG gum Chew 1 piece of gum every 2 to 4 hours (maximum: 24 pieces/day). 11/12/21   Camillia Herter, NP    Family History Family History  Problem Relation Age of Onset   Hypertension Mother    Diabetes Mother    Hypertension Maternal Grandmother     Social History Social History   Tobacco Use   Smoking status: Every Day    Packs/day: 0.25    Years: 1.20    Total pack years: 0.30    Types: Cigarettes    Passive exposure: Current   Smokeless tobacco: Never  Vaping Use   Vaping Use: Never used  Substance Use Topics   Alcohol use: Yes    Comment: socially   Drug use: No     Allergies   Patient has no known allergies.   Review of Systems Review of Systems  Constitutional:  Negative for chills and fever.  HENT:  Positive for congestion, sinus pressure and sore throat. Negative for ear pain.   Eyes:  Negative for discharge and redness.  Respiratory:  Positive for cough. Negative for shortness of breath and wheezing.   Gastrointestinal:  Positive for nausea. Negative for abdominal pain, diarrhea and vomiting.     Physical Exam Triage Vital Signs ED Triage Vitals  Enc Vitals Group     BP --      Pulse Rate 12/24/21 1516 84     Resp 12/24/21 1516 20     Temp 12/24/21 1516 98.1 F (36.7 C)     Temp Source 12/24/21 1516 Oral     SpO2 12/24/21 1516 96 %     Weight --      Height --      Head Circumference --      Peak Flow --      Pain Score 12/24/21 1519 0     Pain Loc --      Pain Edu? --      Excl. in [place]? --    No data found.  Updated Vital Signs Pulse 84   Temp 98.1 F (36.7 C) (Oral)   Resp 20   LMP 11/09/2021 (Exact Date)   SpO2 96%      Physical  Exam Vitals and nursing note reviewed.  Constitutional:      General: She is not in acute distress.    Appearance: Normal appearance. She is not ill-appearing.  HENT:     Head: Normocephalic and atraumatic.     Nose: Congestion present.     Mouth/Throat:     Mouth: Mucous membranes are moist.     Pharynx: No oropharyngeal exudate or posterior oropharyngeal erythema.  Eyes:     Conjunctiva/sclera: Conjunctivae normal.  Cardiovascular:     Rate and Rhythm: Normal rate and regular rhythm.     Heart sounds: Normal heart sounds. No murmur heard. Pulmonary:     Effort: Pulmonary effort is normal. No respiratory distress.     Breath sounds: Normal breath sounds. No wheezing, rhonchi or rales.  Skin:    General: Skin is warm and dry.  Neurological:     Mental Status: She is alert.  Psychiatric:        Mood and Affect: Mood normal.        Thought Content: Thought content normal.      UC Treatments / Results  Labs (all labs ordered are listed, but only abnormal results are displayed) Labs Reviewed - No data to display  EKG   Radiology No results found.  Procedures Procedures (including critical care time)  Medications Ordered in UC Medications - No data to display  Initial Impression / Assessment and Plan / UC Course  I have reviewed the triage vital signs and the nursing notes.  Pertinent labs & imaging results that were available during my care of the patient were reviewed by me and considered in my medical decision making (see chart for details).    Augmentin prescribed to cover sinusitis.  Discussed possibility of other viral illness as well.  Deferred COVID screening at this time given duration of symptoms.  Encouraged follow-up if no gradual improvement or with any further concerns.  Final Clinical Impressions(s) / UC Diagnoses   Final diagnoses:  Acute sinusitis, recurrence not specified, unspecified location  Acute upper respiratory infection   Discharge  Instructions   None    ED Prescriptions     Medication Sig Dispense Auth. Provider   amoxicillin-clavulanate (AUGMENTIN) 875-125 MG tablet Take 1 tablet by mouth every 12 (twelve) hours. 14 tablet Francene Finders, PA-C      PDMP not reviewed this encounter.   Francene Finders, PA-C 12/24/21 1533

## 2022-01-10 ENCOUNTER — Telehealth: Payer: BC Managed Care – PPO | Admitting: Physician Assistant

## 2022-01-10 DIAGNOSIS — R3989 Other symptoms and signs involving the genitourinary system: Secondary | ICD-10-CM

## 2022-01-10 MED ORDER — CEPHALEXIN 500 MG PO CAPS: 500.0000 mg | ORAL_CAPSULE | Freq: Two times a day (BID) | ORAL | 0 refills | Status: DC

## 2022-01-10 NOTE — Progress Notes (Signed)

## 2022-01-17 ENCOUNTER — Telehealth: Payer: BC Managed Care – PPO | Admitting: Family

## 2022-01-17 DIAGNOSIS — J069 Acute upper respiratory infection, unspecified: Secondary | ICD-10-CM

## 2022-01-17 MED ORDER — FLUTICASONE PROPIONATE 50 MCG/ACT NA SUSP: 2.0000 | Freq: Every day | NASAL | 6 refills | Status: DC

## 2022-01-17 MED ORDER — BENZONATATE 100 MG PO CAPS: 100.0000 mg | ORAL_CAPSULE | Freq: Three times a day (TID) | ORAL | 0 refills | Status: DC | PRN

## 2022-01-17 NOTE — Progress Notes (Signed)

## 2022-01-18 ENCOUNTER — Encounter: Payer: Self-pay | Admitting: Family Medicine

## 2022-01-21 ENCOUNTER — Encounter: Payer: Self-pay | Admitting: Pharmacist

## 2022-01-21 ENCOUNTER — Telehealth: Payer: Self-pay | Admitting: Emergency Medicine

## 2022-01-21 ENCOUNTER — Ambulatory Visit: Payer: BC Managed Care – PPO | Attending: Family Medicine | Admitting: Pharmacist

## 2022-01-21 DIAGNOSIS — E119 Type 2 diabetes mellitus without complications: Secondary | ICD-10-CM | POA: Diagnosis not present

## 2022-01-21 NOTE — Progress Notes (Signed)
    S:     No chief complaint on file.  31 y.o. female who presents for diabetes evaluation, education, and management.  PMH is significant for T2DM, HTN, obesity, HLD, MDD, tobacco use.  Patient was referred and last seen by Primary Care Provider, Dr. Andrey Campanile, on 12/17/2021.   Today, patient arrives in good spirits and presents without any assistance.  Patient reports Diabetes was diagnosed in 2016 originally as GM. After she delivered her baby, she lost insurance and went several years without being seen. In 2019, she reestablished, got on metformin with better control. Unfortunately, lost insurance again and most recently got reestablished with Dr. Andrey Campanile.   She has no hx pancreatitis. No thyroid cancer hx. No hx of clinical ASCVD, CHF, CKD.  Family/Social History:  -Fhx: HTN, DM -Tobacco: current 0.25 PPD smoker -Alcohol: none reported  Current diabetes medications include: metformin 750 mg XR BID (has not picked-up), glipizide 10 mg BID (has not picked-up) Current hypertension medications include: lisinopril 20 mg daily Current hyperlipidemia medications include: none  Patient reports adherence to taking all medications as prescribed.   Insurance coverage: BCBS  Patient denies hypoglycemic events.  Reported home fasting blood sugars: 150s  Reported 2 hour post-meal/random blood sugars: 200s (low).  Patient denies nocturia (nighttime urination).  Patient denies neuropathy (nerve pain). Patient denies visual changes. Patient denies self foot exams.   Patient reported dietary habits:  -Admits today that she has a sweet tooth. Has not been limiting sweets.   Patient-reported exercise habits:  - None reported    O:   ROS  Physical Exam  7 day average blood glucose: No GM with her today.   No CGM.   Lab Results  Component Value Date   HGBA1C 10.2 (H) 11/12/2021   There were no vitals filed for this visit.  Lipid Panel     Component Value Date/Time   CHOL  189 02/06/2019 1028   TRIG 210 (H) 02/06/2019 1028   HDL 33 (L) 02/06/2019 1028   CHOLHDL 5.7 (H) 02/06/2019 1028   LDLCALC 119 (H) 02/06/2019 1028   LDLDIRECT 128 (H) 12/05/2019 1424   Clinical Atherosclerotic Cardiovascular Disease (ASCVD): No  The ASCVD Risk score (Arnett DK, et al., 2019) failed to calculate for the following reasons:   The 2019 ASCVD risk score is only valid for ages 24 to 26   A/P: Diabetes longstanding currently uncontrolled. Patient is able to verbalize appropriate hypoglycemia management plan. She has not picked up her metformin or glipizide yet. Will have her commence metformin and return in 1 month. -Continued metformin 750 mg XR BID. Advised to pick this up and start. -Discontinue glipizide for now. Her lack of control is more d/t being without medication.  -Encouraged her to check blood sugar at home.  -Patient educated on purpose, proper use, and potential adverse effects of metformin.  -Extensively discussed pathophysiology of diabetes, recommended lifestyle interventions, dietary effects on blood sugar control.  -Counseled on s/sx of and management of hypoglycemia.  -Next A1c anticipated 02/2022.   Written patient instructions provided. Patient verbalized understanding of treatment plan.  Total time in face to face counseling 30 minutes.    Follow-up:  Pharmacist in 1 month.  Butch Penny, PharmD, Patsy [place], CPP Clinical Pharmacist [facility] [phone]

## 2022-01-21 NOTE — Telephone Encounter (Signed)
Copied from CRM [id number]. Topic: General - Other >> Jan 21, 2022 11:47 AM Esperanza Sheets wrote: Pt requesting a work excuse uploaded to [employer] for 11-17 appt w/ Franky Macho  Please assist further

## 2022-01-21 NOTE — Telephone Encounter (Signed)
Work excuse sent to patient's MyChart.

## 2022-01-31 ENCOUNTER — Telehealth: Payer: BC Managed Care – PPO | Admitting: Nurse Practitioner

## 2022-01-31 DIAGNOSIS — R112 Nausea with vomiting, unspecified: Secondary | ICD-10-CM | POA: Diagnosis not present

## 2022-01-31 MED ORDER — ONDANSETRON HCL 4 MG PO TABS: 4.0000 mg | ORAL_TABLET | Freq: Three times a day (TID) | ORAL | 0 refills | Status: DC | PRN

## 2022-01-31 NOTE — Progress Notes (Signed)
E-Visit for Nausea and Vomiting   We are sorry that you are not feeling well. Here is how we plan to help!  Based on what you have shared with me it looks like you have a Virus that is irritating your GI tract.  Vomiting is the forceful emptying of a portion of the stomach's content through the mouth.  Although nausea and vomiting can make you feel miserable, it's important to remember that these are not diseases, but rather symptoms of an underlying illness.  When we treat short term symptoms, we always caution that any symptoms that persist should be fully evaluated in a medical office.  You should be watching your blood sugar levels closely and assuring you are taking in the appropriate carbohydrates to maintain your blood sugar. If your blood sugar is low and you are unable to tolerate any liquids you will need to be evaluated in person for possible dehydration and IV fluids as needed   I have prescribed a medication that will help alleviate your symptoms and allow you to stay hydrated:  Zofran 4 mg 1 tablet every 8 hours as needed for nausea and vomiting  HOME CARE: Drink clear liquids.  This is very important! Dehydration (the lack of fluid) can lead to a serious complication.  Start off with 1 tablespoon every 5 minutes for 8 hours. You may begin eating bland foods after 8 hours without vomiting.  Start with saltine crackers, white bread, rice, mashed potatoes, applesauce. After 48 hours on a bland diet, you may resume a normal diet. Try to go to sleep.  Sleep often empties the stomach and relieves the need to vomit.  GET HELP RIGHT AWAY IF:  Your symptoms do not improve or worsen within 2 days after treatment. You have a fever for over 3 days. You cannot keep down fluids after trying the medication.  MAKE SURE YOU:  Understand these instructions. Will watch your condition. Will get help right away if you are not doing well or get worse.    Thank you for choosing an  e-visit.  Your e-visit answers were reviewed by a board certified advanced clinical practitioner to complete your personal care plan. Depending upon the condition, your plan could have included both over the counter or prescription medications.  Please review your pharmacy choice. Make sure the pharmacy is open so you can pick up prescription now. If there is a problem, you may contact your provider through [employer] and have the prescription routed to another pharmacy.  Your safety is important to [place]. If you have drug allergies check your prescription carefully.   For the next 24 hours you can use MyChart to ask questions about today's visit, request a non-urgent call back, or ask for a work or school excuse. You will get an email in the next two days asking about your experience. I hope that your e-visit has been valuable and will speed your recovery.   Meds ordered this encounter  Medications   ondansetron (ZOFRAN) 4 MG tablet    Sig: Take 1 tablet (4 mg total) by mouth every 8 (eight) hours as needed for nausea or vomiting.    Dispense:  20 tablet    Refill:  0     I spent approximately 7 minutes reviewing the patient's history, current symptoms and coordinating their plan of care today.

## 2022-02-08 ENCOUNTER — Telehealth: Payer: BC Managed Care – PPO | Admitting: Physician Assistant

## 2022-02-08 DIAGNOSIS — B3731 Acute candidiasis of vulva and vagina: Secondary | ICD-10-CM

## 2022-02-08 MED ORDER — FLUCONAZOLE 150 MG PO TABS: 150.0000 mg | ORAL_TABLET | Freq: Once | ORAL | 0 refills | Status: AC

## 2022-02-08 NOTE — Progress Notes (Signed)

## 2022-02-08 NOTE — Progress Notes (Signed)
I have spent 5 minutes in review of e-visit questionnaire, review and updating patient chart, medical decision making and response to patient.   Suraj Ramdass Cody Vona Whiters, PA-C    

## 2022-02-28 ENCOUNTER — Telehealth: Payer: BC Managed Care – PPO | Admitting: Physician Assistant

## 2022-02-28 DIAGNOSIS — K047 Periapical abscess without sinus: Secondary | ICD-10-CM | POA: Diagnosis not present

## 2022-02-28 MED ORDER — AMOXICILLIN-POT CLAVULANATE 875-125 MG PO TABS: 1.0000 | ORAL_TABLET | Freq: Two times a day (BID) | ORAL | 0 refills | Status: DC

## 2022-02-28 MED ORDER — NAPROXEN 500 MG PO TABS: 500.0000 mg | ORAL_TABLET | Freq: Two times a day (BID) | ORAL | 0 refills | Status: AC

## 2022-02-28 MED ORDER — NAPROXEN 500 MG PO TABS: 500.0000 mg | ORAL_TABLET | Freq: Two times a day (BID) | ORAL | 0 refills | Status: DC

## 2022-02-28 NOTE — Progress Notes (Signed)

## 2022-02-28 NOTE — Addendum Note (Signed)
Addended by: Freddy Finner on: 02/28/2022 01:27 PM   Modules accepted: Orders

## 2022-02-28 NOTE — Progress Notes (Signed)
I have spent 5 minutes in review of e-visit questionnaire, review and updating patient chart, medical decision making and response to patient.   Nicolena Schurman Cody Jeron Grahn, PA-C    

## 2022-03-04 ENCOUNTER — Ambulatory Visit: Payer: BC Managed Care – PPO | Admitting: Pharmacist

## 2022-03-17 ENCOUNTER — Telehealth: Payer: BC Managed Care – PPO | Admitting: Nurse Practitioner

## 2022-03-17 DIAGNOSIS — B3731 Acute candidiasis of vulva and vagina: Secondary | ICD-10-CM | POA: Diagnosis not present

## 2022-03-17 MED ORDER — FLUCONAZOLE 150 MG PO TABS: 150.0000 mg | ORAL_TABLET | Freq: Once | ORAL | 0 refills | Status: AC

## 2022-03-17 NOTE — Progress Notes (Signed)
I have spent 5 minutes in review of e-visit questionnaire, review and updating patient chart, medical decision making and response to patient.  ° °Shivaan Tierno W Ebrahim Deremer, NP ° °  °

## 2022-03-17 NOTE — Progress Notes (Signed)

## 2022-03-21 ENCOUNTER — Ambulatory Visit (INDEPENDENT_AMBULATORY_CARE_PROVIDER_SITE_OTHER): Payer: BC Managed Care – PPO | Admitting: Family Medicine

## 2022-03-21 ENCOUNTER — Encounter: Payer: Self-pay | Admitting: Family Medicine

## 2022-03-21 VITALS — BP 131/84 | HR 86 | Temp 98.1°F | Resp 16 | Wt 356.2 lb

## 2022-03-21 DIAGNOSIS — E785 Hyperlipidemia, unspecified: Secondary | ICD-10-CM | POA: Diagnosis not present

## 2022-03-21 DIAGNOSIS — E119 Type 2 diabetes mellitus without complications: Secondary | ICD-10-CM

## 2022-03-21 DIAGNOSIS — F3289 Other specified depressive episodes: Secondary | ICD-10-CM | POA: Diagnosis not present

## 2022-03-21 DIAGNOSIS — I10 Essential (primary) hypertension: Secondary | ICD-10-CM

## 2022-03-21 DIAGNOSIS — Z720 Tobacco use: Secondary | ICD-10-CM

## 2022-03-21 LAB — POCT GLYCOSYLATED HEMOGLOBIN (HGB A1C): Hemoglobin A1C: 10.4 % — AB (ref 4.0–5.6)

## 2022-03-21 MED ORDER — METFORMIN HCL ER 750 MG PO TB24: 750.0000 mg | ORAL_TABLET | Freq: Two times a day (BID) | ORAL | 1 refills | Status: AC

## 2022-03-21 MED ORDER — SERTRALINE HCL 50 MG PO TABS: 50.0000 mg | ORAL_TABLET | Freq: Every day | ORAL | 1 refills | Status: DC

## 2022-03-21 MED ORDER — LISINOPRIL 20 MG PO TABS: 20.0000 mg | ORAL_TABLET | Freq: Every day | ORAL | 1 refills | Status: DC

## 2022-03-21 NOTE — Progress Notes (Signed)
Patient is here for their 3 month follow-up Patient has no concerns today Care gaps have been discussed with patient  

## 2022-03-21 NOTE — Progress Notes (Signed)
Established Patient Office Visit  Subjective    Patient ID: Kayla Flynn, female    DOB: October 09, 1990  Age: 32 y.o. MRN: [id number]  CC:  Chief Complaint  Patient presents with   Follow-up   Diabetes    HPI Kayla Flynn presents for routine follow up of chronic med issues. Patient denies acute complaints or concerns.    Outpatient Encounter Medications as of 03/21/2022  Medication Sig   Accu-Chek FastClix Lancets MISC 1 each by Other route 4 (four) times daily -  before meals and at bedtime.   benzonatate (TESSALON PERLES) 100 MG capsule Take 1 capsule (100 mg total) by mouth 3 (three) times daily as needed.   Blood Glucose Monitoring Suppl (ACCU-CHEK GUIDE ME) w/Device KIT 1 kit by Other route 4 (four) times daily -  before meals and at bedtime.   fluticasone (FLONASE) 50 MCG/ACT nasal spray Place 2 sprays into both nostrils daily.   glucose blood (ACCU-CHEK GUIDE) test strip 1 each by Other route 4 (four) times daily -  before meals and at bedtime.   ibuprofen (ADVIL) 600 MG tablet Take 1 tablet (600 mg total) by mouth every 6 (six) hours as needed for mild pain or moderate pain.   Lancets Misc. (ACCU-CHEK FASTCLIX LANCET) KIT Use to check FSBS twice a day. Dx: E11.65 Z79.4   nicotine (NICODERM CQ - DOSED IN MG/24 HOURS) 14 mg/24hr patch Place 1 patch (14 mg total) onto the skin daily.   nicotine polacrilex (NICORETTE) 2 MG gum Chew 1 piece of gum every 2 to 4 hours (maximum: 24 pieces/day).   sertraline (ZOLOFT) 50 MG tablet Take 1 tablet (50 mg total) by mouth daily.   [DISCONTINUED] lisinopril (ZESTRIL) 20 MG tablet Take 1 tablet (20 mg total) by mouth daily.   [DISCONTINUED] metFORMIN (GLUCOPHAGE-XR) 750 MG 24 hr tablet Take 1 tablet (750 mg total) by mouth 2 (two) times daily with a meal.   lisinopril (ZESTRIL) 20 MG tablet Take 1 tablet (20 mg total) by mouth daily.   metFORMIN (GLUCOPHAGE-XR) 750 MG 24 hr tablet Take 1 tablet (750 mg total) by mouth 2 (two) times daily with a  meal.   [DISCONTINUED] amoxicillin-clavulanate (AUGMENTIN) 875-125 MG tablet Take 1 tablet by mouth 2 (two) times daily.   [DISCONTINUED] Insulin Pen Needle (B-D UF III MINI PEN NEEDLES) 31G X 5 MM MISC Use as instructed. Inject into the skin once daily  E11.65 Z79.4   [DISCONTINUED] ondansetron (ZOFRAN) 4 MG tablet Take 1 tablet (4 mg total) by mouth every 8 (eight) hours as needed for nausea or vomiting.   No facility-administered encounter medications on file as of 03/21/2022.    Past Medical History:  Diagnosis Date   Acute appendicitis 07/18/2012   Hypertension    Obesity    Obesity, morbid, BMI 50 or higher ([place]) 07/18/2012   Severe preeclampsia 12/10/2014   Type 2 diabetes mellitus [facility])     Past Surgical History:  Procedure Laterality Date   APPENDECTOMY  07/17/2012   CESAREAN SECTION N/A 12/18/2014   Procedure: CESAREAN SECTION;  Surgeon: Osborne Oman, MD;  Location: [facility];  Service: Obstetrics;  Laterality: N/A;   LAPAROSCOPIC APPENDECTOMY N/A 07/17/2012   Procedure: APPENDECTOMY LAPAROSCOPIC;  Surgeon: Zenovia Jarred, MD;  Location: [facility];  Service: General;  Laterality: N/A;    Family History  Problem Relation Age of Onset   Hypertension Mother    Diabetes Mother    Hypertension Maternal Grandmother     Social  History   Socioeconomic History   Marital status: Single    Spouse name: Not on file   Number of children: Not on file   Years of education: Not on file   Highest education level: Not on file  Occupational History   Not on file  Tobacco Use   Smoking status: Every Day    Packs/day: 0.25    Years: 1.20    Total pack years: 0.30    Types: Cigarettes    Passive exposure: Current   Smokeless tobacco: Never  Vaping Use   Vaping Use: Never used  Substance and Sexual Activity   Alcohol use: Yes    Comment: socially   Drug use: No   Sexual activity: Yes    Partners: Male    Birth control/protection: None  Other Topics Concern   Not on file   Social History Narrative   Not on file   Social Determinants of Health   Financial Resource Strain: Not on file  Food Insecurity: Not on file  Transportation Needs: Not on file  Physical Activity: Not on file  Stress: Not on file  Social Connections: Not on file  Intimate Partner Violence: Not on file    Review of Systems  All other systems reviewed and are negative.       Objective    BP 131/84   Pulse 86   Temp 98.1 F (36.7 C) (Oral)   Resp 16   Wt (!) 356 lb 3.2 oz (161.6 kg)   SpO2 95%   BMI 70.74 kg/m   Physical Exam Vitals and nursing note reviewed.  Constitutional:      General: She is not in acute distress.    Appearance: She is obese.  Cardiovascular:     Rate and Rhythm: Normal rate and regular rhythm.  Pulmonary:     Effort: Pulmonary effort is normal.     Breath sounds: Normal breath sounds.  Abdominal:     Palpations: Abdomen is soft.     Tenderness: There is no abdominal tenderness.  Neurological:     General: No focal deficit present.     Mental Status: She is alert and oriented to person, place, and time.          Assessment & Plan:   1. Type 2 diabetes mellitus without complication, without long-term current use of insulin (HCC) A1c slightly increased and not at goal. Referred to [facility] for med management.  - POCT glycosylated hemoglobin (Hb A1C)  2. Essential hypertension Appears stable. continue  3. Other depression Zoloft 50 mg prescribed  4. Hyperlipidemia, unspecified hyperlipidemia type continue  5. Morbid obesity ([place]) Discussed dietary and activity options. [place] is 3-5lbs/mo wt loss  6. Tobacco abuse Discussed reduction/cessation    Return in about 4 weeks (around 04/18/2022) for follow up.   [name], MD

## 2022-03-23 ENCOUNTER — Encounter: Payer: Self-pay | Admitting: Family Medicine

## 2022-04-13 ENCOUNTER — Other Ambulatory Visit: Payer: Self-pay | Admitting: Family Medicine

## 2022-04-18 ENCOUNTER — Ambulatory Visit (INDEPENDENT_AMBULATORY_CARE_PROVIDER_SITE_OTHER): Payer: BC Managed Care – PPO | Admitting: Family Medicine

## 2022-04-18 ENCOUNTER — Encounter: Payer: Self-pay | Admitting: Family Medicine

## 2022-04-18 VITALS — BP 160/111 | HR 72 | Temp 98.1°F | Resp 16 | Wt 352.4 lb

## 2022-04-18 DIAGNOSIS — F17210 Nicotine dependence, cigarettes, uncomplicated: Secondary | ICD-10-CM | POA: Diagnosis not present

## 2022-04-18 DIAGNOSIS — Z720 Tobacco use: Secondary | ICD-10-CM

## 2022-04-18 DIAGNOSIS — I10 Essential (primary) hypertension: Secondary | ICD-10-CM | POA: Diagnosis not present

## 2022-04-18 DIAGNOSIS — F3289 Other specified depressive episodes: Secondary | ICD-10-CM | POA: Diagnosis not present

## 2022-04-18 MED ORDER — HYDROCHLOROTHIAZIDE 25 MG PO TABS: 25.0000 mg | ORAL_TABLET | Freq: Every day | ORAL | 1 refills | Status: DC

## 2022-04-18 MED ORDER — MIRTAZAPINE 15 MG PO TABS: 15.0000 mg | ORAL_TABLET | Freq: Every day | ORAL | 1 refills | Status: DC

## 2022-04-18 NOTE — Progress Notes (Signed)
Established Patient Office Visit  Subjective    Patient ID: Kayla Flynn, female    DOB: 1990-04-02  Age: 32 y.o. MRN: [id number]  CC:  Chief Complaint  Patient presents with   Follow-up    medication    HPI [name] presents for follow up of depression. She reports that she did not like the med prescribed and stopped taking it.    Outpatient Encounter Medications as of 04/18/2022  Medication Sig   Accu-Chek FastClix Lancets MISC 1 each by Other route 4 (four) times daily -  before meals and at bedtime.   benzonatate (TESSALON PERLES) 100 MG capsule Take 1 capsule (100 mg total) by mouth 3 (three) times daily as needed.   Blood Glucose Monitoring Suppl (ACCU-CHEK GUIDE ME) w/Device KIT 1 kit by Other route 4 (four) times daily -  before meals and at bedtime.   fluticasone (FLONASE) 50 MCG/ACT nasal spray Place 2 sprays into both nostrils daily.   glucose blood (ACCU-CHEK GUIDE) test strip 1 each by Other route 4 (four) times daily -  before meals and at bedtime.   hydrochlorothiazide (HYDRODIURIL) 25 MG tablet Take 1 tablet (25 mg total) by mouth daily.   ibuprofen (ADVIL) 600 MG tablet Take 1 tablet (600 mg total) by mouth every 6 (six) hours as needed for mild pain or moderate pain.   Lancets Misc. (ACCU-CHEK FASTCLIX LANCET) KIT Use to check FSBS twice a day. Dx: E11.65 Z79.4   lisinopril (ZESTRIL) 20 MG tablet Take 1 tablet (20 mg total) by mouth daily.   metFORMIN (GLUCOPHAGE-XR) 750 MG 24 hr tablet Take 1 tablet (750 mg total) by mouth 2 (two) times daily with a meal.   mirtazapine (REMERON) 15 MG tablet Take 1 tablet (15 mg total) by mouth at bedtime.   nicotine (NICODERM CQ - DOSED IN MG/24 HOURS) 14 mg/24hr patch Place 1 patch (14 mg total) onto the skin daily.   nicotine polacrilex (NICORETTE) 2 MG gum Chew 1 piece of gum every 2 to 4 hours (maximum: 24 pieces/day).   sertraline (ZOLOFT) 50 MG tablet Take 1 tablet (50 mg total) by mouth daily.   No facility-administered  encounter medications on file as of 04/18/2022.    Past Medical History:  Diagnosis Date   Acute appendicitis 07/18/2012   Hypertension    Obesity    Obesity, morbid, BMI 50 or higher ([place]) 07/18/2012   Severe preeclampsia 12/10/2014   Type 2 diabetes mellitus [facility])     Past Surgical History:  Procedure Laterality Date   APPENDECTOMY  07/17/2012   CESAREAN SECTION N/A 12/18/2014   Procedure: CESAREAN SECTION;  Surgeon: Osborne Oman, MD;  Location: [facility];  Service: Obstetrics;  Laterality: N/A;   LAPAROSCOPIC APPENDECTOMY N/A 07/17/2012   Procedure: APPENDECTOMY LAPAROSCOPIC;  Surgeon: Zenovia Jarred, MD;  Location: [facility] OR;  Service: General;  Laterality: N/A;    Family History  Problem Relation Age of Onset   Hypertension Mother    Diabetes Mother    Hypertension Maternal Grandmother     Social History   Socioeconomic History   Marital status: Single    Spouse name: Not on file   Number of children: Not on file   Years of education: Not on file   Highest education level: Not on file  Occupational History   Not on file  Tobacco Use   Smoking status: Every Day    Packs/day: 0.25    Years: 1.20    Total pack years:  0.30    Types: Cigarettes    Passive exposure: Current   Smokeless tobacco: Never  Vaping Use   Vaping Use: Never used  Substance and Sexual Activity   Alcohol use: Yes    Comment: socially   Drug use: No   Sexual activity: Yes    Partners: Male    Birth control/protection: None  Other Topics Concern   Not on file  Social History Narrative   Not on file   Social Determinants of Health   Financial Resource Strain: Not on file  Food Insecurity: Not on file  Transportation Needs: Not on file  Physical Activity: Not on file  Stress: Not on file  Social Connections: Not on file  Intimate Partner Violence: Not on file    Review of Systems  All other systems reviewed and are negative.       Objective    BP (!) 160/111   Pulse 72    Temp 98.1 F (36.7 C) (Oral)   Resp 16   Wt (!) 352 lb 6.4 oz (159.8 kg)   SpO2 96%   BMI 69.98 kg/m   Physical Exam Vitals and nursing note reviewed.  Constitutional:      General: She is not in acute distress.    Appearance: She is obese.  Cardiovascular:     Rate and Rhythm: Normal rate and regular rhythm.  Pulmonary:     Effort: Pulmonary effort is normal.     Breath sounds: Normal breath sounds.  Abdominal:     Palpations: Abdomen is soft.     Tenderness: There is no abdominal tenderness.  Neurological:     General: No focal deficit present.     Mental Status: She is alert and oriented to person, place, and time.         Assessment & Plan:   1. Other depression Will d/c zoloft and remeron 15 mg prescribed.   2. Uncontrolled hypertension Elevated reading. Will prescribed HCTZ 25 mg as an adjunct to present regimen and monitor  3. Tobacco abuse Discussed reduction/cessation    Return in about 1 week (around 04/25/2022) for follow up.   [name], MD

## 2022-04-18 NOTE — Progress Notes (Signed)
Patient is here for their 1 month follow-up Patient has no concerns today Care gaps have been discussed with patient

## 2022-04-19 ENCOUNTER — Telehealth: Payer: BC Managed Care – PPO | Admitting: Physician Assistant

## 2022-04-19 DIAGNOSIS — R3989 Other symptoms and signs involving the genitourinary system: Secondary | ICD-10-CM

## 2022-04-19 MED ORDER — CEPHALEXIN 500 MG PO CAPS: 500.0000 mg | ORAL_CAPSULE | Freq: Two times a day (BID) | ORAL | 0 refills | Status: AC

## 2022-04-19 NOTE — Progress Notes (Signed)

## 2022-04-19 NOTE — Progress Notes (Signed)
I have spent 5 minutes in review of e-visit questionnaire, review and updating patient chart, medical decision making and response to patient.   Kaedance Magos Cody Cynara Tatham, PA-C    

## 2022-04-20 ENCOUNTER — Ambulatory Visit: Payer: Self-pay

## 2022-04-20 NOTE — Progress Notes (Unsigned)
S:     PCP: Dr. Redmond Pulling  32 y.o. female who presents for diabetes evaluation, education, and management.  PMH is significant for T2DM, HTN, obesity, HLD, MDD, tobacco use.  Patient was referred and last seen by Primary Care Provider, Dr. Redmond Pulling, on 12/17/2021.   At last visit with the pharmacist on 11/17, she was instructed to pick up and start her metformin therapy.   At last visit with PCP on 2/12, no changes were made to her DM regimen. Additionally, BP was elevated at that time. She was started on HCTZ.   Today, patient arrives in good spirits and presents without any assistance. She reported some diarrhea initially with metformin that has since resolved. Additionally, she has been urinating frequently with HCTZ, but states this is tolerable.   Patient reports Diabetes was diagnosed in 2016 originally as GM. After she delivered her baby, she lost insurance and went several years without being seen. In 2019, she reestablished, got on metformin with better control. Unfortunately, lost insurance again and most recently got reestablished with Dr. Redmond Pulling.    She has no hx pancreatitis. No thyroid cancer hx. No hx of clinical ASCVD, CHF, CKD.   Family/Social History:  -Fhx: HTN, DM -Tobacco: current 0.25 PPD smoker -Alcohol: none reported  Current diabetes medications include: metformin 730m XR BID  Current hypertension medications include: HCTZ 249monce daily, lisinopril 2013mnce daily  Current hyperlipidemia medications include: none  Patient reports adherence with medications; She sets a daily alarm to remember to take her medications. She does report missing one dose of medications once per week.   Insurance coverage: BCBS  Patient denies hypoglycemic events.  Reported home fasting blood sugars: 180s   Patient denies nocturia (nighttime urination).  Patient denies neuropathy (nerve pain). Patient denies visual changes. Patient reports self foot exams.    Patient  reported dietary habits:  -Admits today that she has a sweet tooth. Has not been limiting sweets.  -Working to limit her sweets lately; sticking to parfaits    Patient-reported exercise habits:  - None reported    O:  Lab Results  Component Value Date   HGBA1C 10.4 (A) 03/21/2022   There were no vitals filed for this visit.  Lipid Panel     Component Value Date/Time   CHOL 189 02/06/2019 1028   TRIG 210 (H) 02/06/2019 1028   HDL 33 (L) 02/06/2019 1028   CHOLHDL 5.7 (H) 02/06/2019 1028   LDLCALC 119 (H) 02/06/2019 1028   LDLDIRECT 128 (H) 12/05/2019 1424    Clinical Atherosclerotic Cardiovascular Disease (ASCVD): No  The ASCVD Risk score (Arnett DK, et al., 2019) failed to calculate for the following reasons:   The 2019 ASCVD risk score is only valid for ages 40 33 79 41A/P: Diabetes longstanding currently uncontrolled based on A1c and home readings. Patient is able to verbalize appropriate hypoglycemia management plan. Medication adherence appears appropriate. Control is suboptimal due to dietary indiscretions. -Started GLP-1 Ozempic 0.71m71mce weekly, will increase to 0.5mg 66me weekly after 4 weeks -Continued metformin 750mg 46m -Patient educated on purpose, proper use, and potential adverse effects of Ozempic.  -Extensively discussed pathophysiology of diabetes, recommended lifestyle interventions, dietary effects on blood sugar control.  -Counseled on s/sx of and management of hypoglycemia.  -Next A1c anticipated 06/2022.  - Follow up labs: CMP, lipid panel  Written patient instructions provided. Patient verbalized understanding of treatment plan.  Total time in face to face counseling 20 minutes.  Follow-up:  Pharmacist in one month. PCP clinic visit in 04/28/2022  Maryan Puls, PharmD PGY-1 [facility] Resident

## 2022-04-20 NOTE — Telephone Encounter (Signed)
  Chief Complaint: medication assistance  Symptoms: NA Frequency: today Pertinent Negatives: NA Disposition: [] ED /[] Urgent Care (no appt availability in office) / [] Appointment(In office/virtual)/ []  [facility]/ [x] Home Care/ [] Refused Recommended Disposition /[] [place] [facility]/ []  Follow-up with PCP Additional Notes: advised pt from OV notes on 04/18/22 to take hctz in addition to lisinopril. Pt states she has done ate breakfast so will go ahead and take both medications. No further assistance needed.   2. Uncontrolled hypertension Elevated reading. Will prescribed HCTZ 25 mg as an adjunct to present regimen and monitor  Summary: Medication Management.   Pt stated she was prescribed the medication hydrochlorothiazide (HYDRODIURIL) 25 MG tablet which she just picked up today, and is unsure if she should continue to take her medication lisinopril (ZESTRIL) 20 MG tablet as well or if she needs to stop.   Pt seeking clarification.         Reason for Disposition  Caller has medicine question only, adult not sick, AND triager answers question  Answer Assessment - Initial Assessment Questions 1. NAME of MEDICINE: "What medicine(s) are you calling about?"     hctz 2. QUESTION: "What is your question?" (e.g., double dose of medicine, side effect)     Wanting to know if needed to stop lisinopril or take with it 3. PRESCRIBER: "Who prescribed the medicine?" Reason: if prescribed by specialist, call should be referred to that group.     Dr. Redmond Pulling 4. SYMPTOMS: "Do you have any symptoms?" If Yes, ask: "What symptoms are you having?"  "How bad are the symptoms (e.g., mild, moderate, severe)     NA  Protocols used: Medication Question Call-A-AH

## 2022-04-21 ENCOUNTER — Encounter: Payer: Self-pay | Admitting: Pharmacist

## 2022-04-21 ENCOUNTER — Other Ambulatory Visit (HOSPITAL_COMMUNITY): Payer: Self-pay

## 2022-04-21 ENCOUNTER — Telehealth: Payer: Self-pay | Admitting: Pharmacist

## 2022-04-21 ENCOUNTER — Other Ambulatory Visit: Payer: Self-pay

## 2022-04-21 ENCOUNTER — Encounter: Payer: Self-pay | Admitting: Family Medicine

## 2022-04-21 ENCOUNTER — Ambulatory Visit: Payer: BC Managed Care – PPO | Attending: Family Medicine | Admitting: Pharmacist

## 2022-04-21 ENCOUNTER — Other Ambulatory Visit: Payer: Self-pay | Admitting: Pharmacist

## 2022-04-21 DIAGNOSIS — E119 Type 2 diabetes mellitus without complications: Secondary | ICD-10-CM

## 2022-04-21 MED ORDER — OZEMPIC (0.25 OR 0.5 MG/DOSE) 2 MG/3ML ~~LOC~~ SOPN: 0.2500 mg | PEN_INJECTOR | SUBCUTANEOUS | 2 refills | Status: DC

## 2022-04-21 NOTE — Telephone Encounter (Signed)
[place] friend. We are hoping to start Ozempic in her but it requires a PA. Are you able to start one for her?

## 2022-04-21 NOTE — Telephone Encounter (Signed)
PA approved.   Pt informed

## 2022-04-22 LAB — LIPID PANEL
Chol/HDL Ratio: 6.5 ratio — ABNORMAL HIGH (ref 0.0–4.4)
Cholesterol, Total: 203 mg/dL — ABNORMAL HIGH (ref 100–199)
HDL: 31 mg/dL — ABNORMAL LOW (ref >39)
LDL Chol Calc (NIH): 127 mg/dL — ABNORMAL HIGH (ref 0–99)
Triglycerides: 255 mg/dL — ABNORMAL HIGH (ref 0–149)
VLDL Cholesterol Cal: 45 mg/dL — ABNORMAL HIGH (ref 5–40)

## 2022-04-22 LAB — CMP14+EGFR
ALT: 15 IU/L (ref 0–32)
AST: 11 IU/L (ref 0–40)
Albumin/Globulin Ratio: 1.3 (ref 1.2–2.2)
Albumin: 4.3 g/dL (ref 4.0–5.0)
Alkaline Phosphatase: 85 IU/L (ref 44–121)
BUN/Creatinine Ratio: 13 (ref 9–23)
BUN: 10 mg/dL (ref 6–20)
Bilirubin Total: 0.2 mg/dL (ref 0.0–1.2)
CO2: 22 mmol/L (ref 20–29)
Calcium: 9.7 mg/dL (ref 8.7–10.2)
Chloride: 98 mmol/L (ref 96–106)
Creatinine, Ser: 0.78 mg/dL (ref 0.57–1.00)
Globulin, Total: 3.2 g/dL (ref 1.5–4.5)
Glucose: 243 mg/dL — ABNORMAL HIGH (ref 70–99)
Potassium: 4.4 mmol/L (ref 3.5–5.2)
Sodium: 137 mmol/L (ref 134–144)
Total Protein: 7.5 g/dL (ref 6.0–8.5)
eGFR: 105 mL/min/{1.73_m2} (ref >59)

## 2022-04-28 ENCOUNTER — Ambulatory Visit (INDEPENDENT_AMBULATORY_CARE_PROVIDER_SITE_OTHER): Payer: BC Managed Care – PPO | Admitting: Family Medicine

## 2022-04-28 ENCOUNTER — Encounter: Payer: Self-pay | Admitting: Family Medicine

## 2022-04-28 VITALS — BP 124/80 | HR 80 | Temp 98.1°F | Resp 16 | Wt 345.4 lb

## 2022-04-28 DIAGNOSIS — I10 Essential (primary) hypertension: Secondary | ICD-10-CM

## 2022-04-28 DIAGNOSIS — F3289 Other specified depressive episodes: Secondary | ICD-10-CM

## 2022-04-28 DIAGNOSIS — E785 Hyperlipidemia, unspecified: Secondary | ICD-10-CM

## 2022-04-28 DIAGNOSIS — Z720 Tobacco use: Secondary | ICD-10-CM

## 2022-04-28 DIAGNOSIS — E119 Type 2 diabetes mellitus without complications: Secondary | ICD-10-CM

## 2022-04-28 MED ORDER — LISINOPRIL 20 MG PO TABS: 20.0000 mg | ORAL_TABLET | Freq: Every day | ORAL | 1 refills | Status: AC

## 2022-04-28 MED ORDER — MIRTAZAPINE 15 MG PO TABS: 15.0000 mg | ORAL_TABLET | Freq: Every day | ORAL | 1 refills | Status: AC

## 2022-04-28 MED ORDER — HYDROCHLOROTHIAZIDE 25 MG PO TABS: 25.0000 mg | ORAL_TABLET | Freq: Every day | ORAL | 1 refills | Status: DC

## 2022-04-29 ENCOUNTER — Encounter: Payer: Self-pay | Admitting: Family Medicine

## 2022-04-29 NOTE — Progress Notes (Signed)
Established Patient Office Visit  Subjective    Patient ID: Kayla Flynn, female    DOB: Dec 04, 1990  Age: 32 y.o. MRN: [id number]  CC:  Chief Complaint  Patient presents with   Follow-up   Hypertension    HPI Kayla Flynn presents for follow up of chronic med issues. Patient denies acute complaints in concerns.    Outpatient Encounter Medications as of 04/28/2022  Medication Sig   Accu-Chek FastClix Lancets MISC 1 each by Other route 4 (four) times daily -  before meals and at bedtime.   benzonatate (TESSALON PERLES) 100 MG capsule Take 1 capsule (100 mg total) by mouth 3 (three) times daily as needed.   Blood Glucose Monitoring Suppl (ACCU-CHEK GUIDE ME) w/Device KIT 1 kit by Other route 4 (four) times daily -  before meals and at bedtime.   fluticasone (FLONASE) 50 MCG/ACT nasal spray Place 2 sprays into both nostrils daily.   glucose blood (ACCU-CHEK GUIDE) test strip 1 each by Other route 4 (four) times daily -  before meals and at bedtime.   ibuprofen (ADVIL) 600 MG tablet Take 1 tablet (600 mg total) by mouth every 6 (six) hours as needed for mild pain or moderate pain.   Lancets Misc. (ACCU-CHEK FASTCLIX LANCET) KIT Use to check FSBS twice a day. Dx: E11.65 Z79.4   metFORMIN (GLUCOPHAGE-XR) 750 MG 24 hr tablet Take 1 tablet (750 mg total) by mouth 2 (two) times daily with a meal.   nicotine (NICODERM CQ - DOSED IN MG/24 HOURS) 14 mg/24hr patch Place 1 patch (14 mg total) onto the skin daily.   nicotine polacrilex (NICORETTE) 2 MG gum Chew 1 piece of gum every 2 to 4 hours (maximum: 24 pieces/day).   Semaglutide,0.25 or 0.'5MG'$ /DOS, (OZEMPIC, 0.25 OR 0.5 MG/DOSE,) 2 MG/3ML SOPN Inject 0.25 mg into the skin once a week. X4 weeks, then increase to 0.5 mg weekly.   [DISCONTINUED] hydrochlorothiazide (HYDRODIURIL) 25 MG tablet Take 1 tablet (25 mg total) by mouth daily.   [DISCONTINUED] lisinopril (ZESTRIL) 20 MG tablet Take 1 tablet (20 mg total) by mouth daily.   [DISCONTINUED]  mirtazapine (REMERON) 15 MG tablet Take 1 tablet (15 mg total) by mouth at bedtime.   hydrochlorothiazide (HYDRODIURIL) 25 MG tablet Take 1 tablet (25 mg total) by mouth daily.   lisinopril (ZESTRIL) 20 MG tablet Take 1 tablet (20 mg total) by mouth daily.   mirtazapine (REMERON) 15 MG tablet Take 1 tablet (15 mg total) by mouth at bedtime.   [DISCONTINUED] sertraline (ZOLOFT) 50 MG tablet Take 1 tablet (50 mg total) by mouth daily.   No facility-administered encounter medications on file as of 04/28/2022.    Past Medical History:  Diagnosis Date   Acute appendicitis 07/18/2012   Hypertension    Obesity    Obesity, morbid, BMI 50 or higher ([place]) 07/18/2012   Severe preeclampsia 12/10/2014   Type 2 diabetes mellitus [facility])     Past Surgical History:  Procedure Laterality Date   APPENDECTOMY  07/17/2012   CESAREAN SECTION N/A 12/18/2014   Procedure: CESAREAN SECTION;  Surgeon: Osborne Oman, MD;  Location: [place] [facility];  Service: Obstetrics;  Laterality: N/A;   LAPAROSCOPIC APPENDECTOMY N/A 07/17/2012   Procedure: APPENDECTOMY LAPAROSCOPIC;  Surgeon: Zenovia Jarred, MD;  Location: [facility] OR;  Service: General;  Laterality: N/A;    Family History  Problem Relation Age of Onset   Hypertension Mother    Diabetes Mother    Hypertension Maternal Grandmother  Social History   Socioeconomic History   Marital status: Single    Spouse name: Not on file   Number of children: Not on file   Years of education: Not on file   Highest education level: Not on file  Occupational History   Not on file  Tobacco Use   Smoking status: Every Day    Packs/day: 0.25    Years: 1.20    Total pack years: 0.30    Types: Cigarettes    Passive exposure: Current   Smokeless tobacco: Never  Vaping Use   Vaping Use: Never used  Substance and Sexual Activity   Alcohol use: Yes    Comment: socially   Drug use: No   Sexual activity: Yes    Partners: Male    Birth control/protection: None  Other Topics  Concern   Not on file  Social History Narrative   Not on file   Social Determinants of Health   Financial Resource Strain: Not on file  Food Insecurity: Not on file  Transportation Needs: Not on file  Physical Activity: Not on file  Stress: Not on file  Social Connections: Not on file  Intimate Partner Violence: Not on file    Review of Systems  All other systems reviewed and are negative.       Objective    BP 124/80   Pulse 80   Temp 98.1 F (36.7 C) (Oral)   Resp 16   Wt (!) 345 lb 6.4 oz (156.7 kg)   SpO2 96%   BMI 68.59 kg/m   Physical Exam Vitals and nursing note reviewed.  Constitutional:      General: She is not in acute distress.    Appearance: She is obese.  Cardiovascular:     Rate and Rhythm: Normal rate and regular rhythm.  Pulmonary:     Effort: Pulmonary effort is normal.     Breath sounds: Normal breath sounds.  Abdominal:     Palpations: Abdomen is soft.     Tenderness: There is no abdominal tenderness.  Neurological:     General: No focal deficit present.     Mental Status: She is alert and oriented to person, place, and time.         Assessment & Plan:   1. Essential hypertension Appears stable. Continue and monitor  2. Type 2 diabetes mellitus without complication, without long-term current use of insulin (HCC) Elevated A1c above goal. Continue. Discuss dietary and activity options.   3. Obesity, morbid, BMI 82 (HCC) Discussed dietary and activity options  4. Other depression Remeron prescribed  5. Hyperlipidemia, unspecified hyperlipidemia type Continue   6. Tobacco abuse Discussed reduction/cessation    Return in about 3 months (around 07/27/2022) for follow up.   Kayla Sax, MD

## 2022-05-04 ENCOUNTER — Encounter: Payer: Self-pay | Admitting: Family Medicine

## 2022-05-04 ENCOUNTER — Telehealth: Payer: BC Managed Care – PPO | Admitting: Family Medicine

## 2022-05-04 DIAGNOSIS — R6889 Other general symptoms and signs: Secondary | ICD-10-CM | POA: Diagnosis not present

## 2022-05-04 MED ORDER — OSELTAMIVIR PHOSPHATE 75 MG PO CAPS: 75.0000 mg | ORAL_CAPSULE | Freq: Two times a day (BID) | ORAL | 0 refills | Status: AC

## 2022-05-04 NOTE — Progress Notes (Signed)
E visit for Flu like symptoms   We are sorry that you are not feeling well.  Here is how we plan to help! Based on what you have shared with me it looks like you may have a respiratory virus that may be influenza.  Influenza or "the flu" is   an infection caused by a respiratory virus. The flu virus is highly contagious and persons who did not receive their yearly flu vaccination may "catch" the flu from close contact.  We have anti-viral medications to treat the viruses that cause this infection. They are not a "cure" and only shorten the course of the infection. These prescriptions are most effective when they are given within the first 2 days of "flu" symptoms. Antiviral medication are indicated if you have a high risk of complications from the flu. You should  also consider an antiviral medication if you are in close contact with someone who is at risk. These medications can help patients avoid complications from the flu  but have side effects that you should know. Possible side effects from Tamiflu or oseltamivir include nausea, vomiting, diarrhea, dizziness, headaches, eye redness, sleep problems or other respiratory symptoms. You should not take Tamiflu if you have an allergy to oseltamivir or any to the ingredients in Tamiflu.  Based upon your symptoms and potential risk factors I have prescribed Oseltamivir (Tamiflu).  It has been sent to your designated pharmacy.  You will take one 75 mg capsule orally twice a day for the next 5 days.  ANYONE WHO HAS FLU SYMPTOMS SHOULD: Stay home. The flu is highly contagious and going out or to work exposes others! Be sure to drink plenty of fluids. Water is fine as well as fruit juices, sodas and electrolyte beverages. You may want to stay away from caffeine or alcohol. If you are nauseated, try taking small sips of liquids. How do you know if you are getting enough fluid? Your urine should be a pale yellow or almost colorless. Get rest. Taking a steamy  shower or using a humidifier may help nasal congestion and ease sore throat pain. Using a saline nasal spray works much the same way. Cough drops, hard candies and sore throat lozenges may ease your cough. Line up a caregiver. Have someone check on you regularly.   GET HELP RIGHT AWAY IF: You cannot keep down liquids or your medications. You become short of breath Your fell like you are going to pass out or loose consciousness. Your symptoms persist after you have completed your treatment plan MAKE SURE YOU  Understand these instructions. Will watch your condition. Will get help right away if you are not doing well or get worse.  Your e-visit answers were reviewed by a board certified advanced clinical practitioner to complete your personal care plan.  Depending on the condition, your plan could have included both over the counter or prescription medications.  If there is a problem please reply  once you have received a response from your provider.  Your safety is important to us.  If you have drug allergies check your prescription carefully.    You can use MyChart to ask questions about today's visit, request a non-urgent call back, or ask for a work or school excuse for 24 hours related to this e-Visit. If it has been greater than 24 hours you will need to follow up with your provider, or enter a new e-Visit to address those concerns.  You will get an e-mail in the next   two days asking about your experience.  I hope that your e-visit has been valuable and will speed your recovery. Thank you for using e-visits.  I provided 5 minutes of non face-to-face time during this encounter for chart review, medication and order placement, as well as and documentation.   

## 2022-05-11 ENCOUNTER — Telehealth: Payer: BC Managed Care – PPO | Admitting: Physician Assistant

## 2022-05-11 DIAGNOSIS — J069 Acute upper respiratory infection, unspecified: Secondary | ICD-10-CM

## 2022-05-11 MED ORDER — ALBUTEROL SULFATE HFA 108 (90 BASE) MCG/ACT IN AERS: 1.0000 | INHALATION_SPRAY | Freq: Four times a day (QID) | RESPIRATORY_TRACT | 0 refills | Status: AC | PRN

## 2022-05-11 MED ORDER — PROMETHAZINE-DM 6.25-15 MG/5ML PO SYRP: 5.0000 mL | ORAL_SOLUTION | Freq: Four times a day (QID) | ORAL | 0 refills | Status: AC | PRN

## 2022-05-11 MED ORDER — BENZONATATE 100 MG PO CAPS: 100.0000 mg | ORAL_CAPSULE | Freq: Three times a day (TID) | ORAL | 0 refills | Status: DC | PRN

## 2022-05-11 NOTE — Progress Notes (Signed)
We are sorry that you are not feeling well.  Here is how we plan to help!  Based on your presentation I believe you most likely have A cough due to a virus.  This is called viral bronchitis and is best treated by rest, plenty of fluids and control of the cough.  You may use Ibuprofen or Tylenol as directed to help your symptoms.     In addition you may use A prescription cough medication called Tessalon Perles '100mg'$ . You may take 1-2 capsules every 8 hours as needed for your cough. And Promethazine DM cough syrup Take 64m every 6 hours as needed for cough  Albuterol inhaler 1-2 puffs every 6 hours as needed for shortness of breath and wheezing.  From your responses in the eVisit questionnaire you describe inflammation in the upper respiratory tract which is causing a significant cough.  This is commonly called Bronchitis and has four common causes:   Allergies Viral Infections Acid Reflux Bacterial Infection Allergies, viruses and acid reflux are treated by controlling symptoms or eliminating the cause. An example might be a cough caused by taking certain blood pressure medications. You stop the cough by changing the medication. Another example might be a cough caused by acid reflux. Controlling the reflux helps control the cough.  USE OF BRONCHODILATOR ("RESCUE") INHALERS: There is a risk from using your bronchodilator too frequently.  The risk is that over-reliance on a medication which only relaxes the muscles surrounding the breathing tubes can reduce the effectiveness of medications prescribed to reduce swelling and congestion of the tubes themselves.  Although you feel brief relief from the bronchodilator inhaler, your asthma may actually be worsening with the tubes becoming more swollen and filled with mucus.  This can delay other crucial treatments, such as oral steroid medications. If you need to use a bronchodilator inhaler daily, several times per day, you should discuss this with your  provider.  There are probably better treatments that could be used to keep your asthma under control.     HOME CARE Only take medications as instructed by your medical team. Complete the entire course of an antibiotic. Drink plenty of fluids and get plenty of rest. Avoid close contacts especially the very young and the elderly Cover your mouth if you cough or cough into your sleeve. Always remember to wash your hands A steam or ultrasonic humidifier can help congestion.   GET HELP RIGHT AWAY IF: You develop worsening fever. You become short of breath You cough up blood. Your symptoms persist after you have completed your treatment plan MAKE SURE YOU  Understand these instructions. Will watch your condition. Will get help right away if you are not doing well or get worse.    Thank you for choosing an e-visit.  Your e-visit answers were reviewed by a board certified advanced clinical practitioner to complete your personal care plan. Depending upon the condition, your plan could have included both over the counter or prescription medications.  Please review your pharmacy choice. Make sure the pharmacy is open so you can pick up prescription now. If there is a problem, you may contact your provider through [employer] have the prescription routed to another pharmacy.  Your safety is important to [place] If you have drug allergies check your prescription carefully.   For the next 24 hours you can use MyChart to ask questions about today's visit, request a non-urgent call back, or ask for a work or school excuse. You will get  an email in the next two days asking about your experience. I hope that your e-visit has been valuable and will speed your recovery.  I have spent 5 minutes in review of e-visit questionnaire, review and updating patient chart, medical decision making and response to patient.   Mar Daring, PA-C

## 2022-05-16 ENCOUNTER — Encounter: Payer: BC Managed Care – PPO | Admitting: Nurse Practitioner

## 2022-05-16 NOTE — Progress Notes (Signed)
Kayla Flynn,  After review of your chart we have treated you several times for a yeast infection over a virtual visit. It is in best practice to have an in person evaluation for recurrent symptoms or conditions as this service is not meant for chronic or recurrent symptoms.  It is best to have testing to assure you have yeast and not BV and are receiving the best plan of care.  I feel your condition warrants further evaluation and I recommend that you be seen for a face to face visit.  Please contact your primary care physician practice to be seen. Many offices offer virtual options to be seen via video if you are not comfortable going in person to a medical facility at this time.  NOTE: You will NOT be charged for this eVisit.  If you do not have a PCP, [facility] offers a free physician referral service available at [phone]. Our trained staff has the experience, knowledge and resources to put you in touch with a physician who is right for you.    If you are having a true medical emergency please call 911.   Your e-visit answers were reviewed by a board certified advanced clinical practitioner to complete your personal care plan.  Thank you for using e-Visits.

## 2022-05-30 ENCOUNTER — Encounter: Payer: Self-pay | Admitting: Pharmacist

## 2022-05-30 ENCOUNTER — Telehealth: Payer: Self-pay | Admitting: Pharmacist

## 2022-05-30 ENCOUNTER — Encounter: Payer: Self-pay | Admitting: Family Medicine

## 2022-05-30 ENCOUNTER — Ambulatory Visit: Payer: BC Managed Care – PPO | Attending: Family Medicine | Admitting: Pharmacist

## 2022-05-30 DIAGNOSIS — E119 Type 2 diabetes mellitus without complications: Secondary | ICD-10-CM

## 2022-05-30 DIAGNOSIS — E785 Hyperlipidemia, unspecified: Secondary | ICD-10-CM

## 2022-05-30 LAB — POCT GLYCOSYLATED HEMOGLOBIN (HGB A1C): HbA1c, POC (controlled diabetic range): 8.2 % — AB (ref 0.0–7.0)

## 2022-05-30 MED ORDER — ROSUVASTATIN CALCIUM 10 MG PO TABS: 10.0000 mg | ORAL_TABLET | Freq: Every day | ORAL | 1 refills | Status: DC

## 2022-05-30 MED ORDER — SEMAGLUTIDE (1 MG/DOSE) 4 MG/3ML ~~LOC~~ SOPN: 1.0000 mg | PEN_INJECTOR | SUBCUTANEOUS | 2 refills | Status: DC

## 2022-05-30 NOTE — Progress Notes (Signed)
S:    PCP: Dr. Redmond Pulling  32 y.o. female who presents for diabetes evaluation, education, and management.  PMH is significant for T2DM, HTN, obesity, HLD, MDD, tobacco use.  Patient was referred and last seen by Primary Care Provider, Dr. Redmond Pulling, on 12/17/2021. Last seen by pharmacy clinic on 04/21/2022 where Ozempic 0.25 mg was started and lipid panel was collected showing an LDL of 127.   Today, patient arrives in good spirits and presents without any assistance. She has tolerated Ozempic without GI issues. Has lost ~15 lbs since starting Ozempic. She is interested in the LIBERATE CGM study. A1c today 8.2%. She has no hx pancreatitis. No thyroid cancer hx. No hx of clinical ASCVD, CHF, CKD.   Family/Social History:  -Fhx: HTN, DM -Tobacco: current 0.25 PPD smoker -Alcohol: none reported  Current diabetes medications include: metformin 750mg  XR BID, Ozempic 0.5 mg weekly (Monday's, 2nd dose was today) Current hypertension medications include: HCTZ 25mg  once daily, lisinopril 20mg  once daily  Current hyperlipidemia medications include: none  Patient reports adherence with medications; She sets a daily alarm to remember to take her medications. She does report missing one dose of medications once per week.   Insurance coverage: BCBS  Patient denies hypoglycemic events.  Reported home fasting blood sugars: not checking often, last checked Saturday morning 156  Patient denies nocturia (nighttime urination).  Patient denies neuropathy (nerve pain). Patient denies visual changes. Patient reports self foot exams.    Patient reported dietary habits:  -Admits today that she has a sweet tooth. Has not been limiting sweets.  -Working to limit her sweets lately; sticking to parfaits  -eating a lot less.    Patient-reported exercise habits:  - None reported   O:  Lab Results  Component Value Date   HGBA1C 8.2 (A) 05/30/2022   There were no vitals filed for this visit.  Lipid Panel      Component Value Date/Time   CHOL 203 (H) 04/21/2022 0954   TRIG 255 (H) 04/21/2022 0954   HDL 31 (L) 04/21/2022 0954   CHOLHDL 6.5 (H) 04/21/2022 0954   LDLCALC 127 (H) 04/21/2022 0954   LDLDIRECT 128 (H) 12/05/2019 1424    Clinical Atherosclerotic Cardiovascular Disease (ASCVD): No  The ASCVD Risk score (Arnett DK, et al., 2019) failed to calculate for the following reasons:   The 2019 ASCVD risk score is only valid for ages 54 to 62   POC A1c Today: 8.2%  A/P: LIBERATE Study:  -Patient provided verbal consent to participate in the study. Consent documented in electronic medical record.  -Provided education on Libre 3 CGM. Collaborated to ensure Elenor Legato 3 app was downloaded on patient's phone. Educated on how to place sensor every 14 days, patient placed first sensor correctly and verbalized understanding of use, removal, and how to place next sensor. Discussed alarms. 8 sensors provided for a 3 month supply. Educated to contact the office if the sensor falls off early and replacements are needed before their next [employer].   Diabetes longstanding currently uncontrolled based on A1c, but improving. A1c 8.2% today. Patient is able to verbalize appropriate hypoglycemia management plan. Medication adherence appears appropriate. Control is suboptimal due to dietary indiscretions. -Continue GLP-1 Ozempic 0.5mg  once weekly. Patient has tolerated 2 doses without issue. Instructed patient to increase to 1 mg once weekly after 2 additional doses of 0.5 mg. -Continued metformin 750mg  BID  -Patient educated on purpose, proper use, and potential adverse effects of Ozempic.  -Extensively discussed pathophysiology  of diabetes, recommended lifestyle interventions, dietary effects on blood sugar control.  -Counseled on s/sx of and management of hypoglycemia.  -Next A1c anticipated 08/2022.   ASCVD risk - primary prevention in patient with diabetes. Last LDL was 127, not at goal of <70 mg/dL.  moderate intensity statin indicated.  -Started rosuvastatin 10 mg daily. LFT WNL February 2024.  Written patient instructions provided. Patient verbalized understanding of treatment plan.  Total time in face to face counseling 40 minutes.    Follow-up:  Pharmacist in 3 months PCP clinic visit in 07/27/2022  Joseph Art, Pharm.D. PGY-2 Ambulatory Care Pharmacy Resident

## 2022-05-30 NOTE — Telephone Encounter (Signed)
LIBERATE Study  Received referral for patient participation in the LIBERATE CGM Study. Contacted patient to discuss study and confirmed HIPAA identifiers. Confirmed patient was provided the LIBERATE Study Information Sheet and any questions were answered.   Confirmed that patient meets study criteria by having a diagnosis of Type 2 Diabetes, is not currently on insulin, and most recent A1c is >8%.  Patient provided verbal consent to participate in the study. Consent documented in electronic medical record.   @CGMFLO @  - Confirmed that patient has a compatible smart phone to [employer] 3 app. ([email or web address]) - Asked to download and create a Elenor Legato account prior to first study visit.  - Scheduled first study visit today. Confirmed patient has transportation to this appointment.  - Discussed use of MyChart in this study. Confirmed patient has an active MyChart account and is aware of their log in information.  Patient aware of pre-visit questionnaire that will be sent 2 days prior to their scheduled study visit and they will plan to complete before the visit.   Benard Halsted, PharmD, [place], [place] [phone]

## 2022-06-06 ENCOUNTER — Telehealth: Payer: Self-pay | Admitting: Pharmacist

## 2022-06-06 NOTE — Telephone Encounter (Signed)
LIBERATE Study  Patient completed first study visit for the LIBERATE CGM Study. Contacted patient to discuss CGM tolerability. Confirmed HIPAA identifiers. She is returning my call from earlier today.    Patient confirms Elenor Legato 3 sensors is working and glucose values are transmitting appropriately. Denies any questions or concerns.   - Confirmed second study visit on 08/30/2022. Confirmed patient has transportation to this appointment.  - Discussed use of MyChart in this study. Confirmed patient has an active MyChart account and is aware of their log in information.  Patient aware of pre-visit questionnaire that will be sent 2 days prior to their scheduled study visit and they will plan to complete before the visit.    Benard Halsted, PharmD, Para [place], [place] [phone]

## 2022-06-06 NOTE — Telephone Encounter (Signed)
LIBERATE Study  Patient completed first study visit for the LIBERATE CGM Study. Contacted patient to discuss CGM tolerability. Was unable to connect with her, so I left a HIPAA-compliant VM with instructions to return my call.   Of note, review of Libreview shows the following:     From this, it looks like she is tolerating her sensors well but usage does need an increase.  - Confirmed second study visit on 08/30/2022.    Benard Halsted, PharmD, [place], [place] [phone]

## 2022-06-27 ENCOUNTER — Other Ambulatory Visit: Payer: Self-pay | Admitting: Family Medicine

## 2022-06-27 DIAGNOSIS — E119 Type 2 diabetes mellitus without complications: Secondary | ICD-10-CM

## 2022-06-27 NOTE — Telephone Encounter (Signed)
Pt called reporting that she is down to her last dosage of Ozempic, she is seeking a refill.   [id number] [place], [place] - [address]  [address] [place] [address]  Phone: [phone] Fax: [phone]

## 2022-06-28 NOTE — Telephone Encounter (Signed)
Rx 05/30/22 3ml 2RF Requested Prescriptions  Pending Prescriptions Disp Refills   Semaglutide, 1 MG/DOSE, 4 MG/3ML SOPN 3 mL 2    Sig: Inject 1 mg as directed once a week.     Endocrinology:  Diabetes - GLP-1 Receptor Agonists - semaglutide Failed - 06/27/2022 12:41 PM      Failed - HBA1C in normal range and within 180 days    HbA1c, POC (controlled diabetic range)  Date Value Ref Range Status  05/30/2022 8.2 (A) 0.0 - 7.0 % Final         Passed - Cr in normal range and within 360 days    Creat  Date Value Ref Range Status  07/14/2014 0.54 0.50 - 1.10 mg/dL Final   Creatinine, Ser  Date Value Ref Range Status  04/21/2022 0.78 0.57 - 1.00 mg/dL Final   Creatinine, Urine  Date Value Ref Range Status  12/10/2014 120.00 mg/dL Final         Passed - Valid encounter within last 6 months    Recent Outpatient Visits           4 weeks ago Type 2 diabetes mellitus without complication, without long-term current use of insulin (HCC)   [facility] [place] [place], Cornelius Moras, RPH-CPP   2 months ago Essential hypertension   [facility], Lauris Poag, MD   2 months ago Type 2 diabetes mellitus without complication, without long-term current use of insulin [facility])   [facility] [place], Cornelius Moras, RPH-CPP   2 months ago Other depression   [place], Lauris Poag, MD   3 months ago Type 2 diabetes mellitus without complication, without long-term current use of insulin [facility])   [place] [facility] [place], MD       Future Appointments             In 4 weeks Georganna Skeans, MD [facility]   In 2 months Lois Huxley, Cornelius Moras, RPH-CPP [facility]

## 2022-07-07 ENCOUNTER — Telehealth: Payer: BC Managed Care – PPO | Admitting: Physician Assistant

## 2022-07-07 DIAGNOSIS — M545 Low back pain, unspecified: Secondary | ICD-10-CM | POA: Diagnosis not present

## 2022-07-07 MED ORDER — CYCLOBENZAPRINE HCL 10 MG PO TABS: 10.0000 mg | ORAL_TABLET | Freq: Three times a day (TID) | ORAL | 0 refills | Status: DC | PRN

## 2022-07-07 MED ORDER — NAPROXEN 500 MG PO TABS: 500.0000 mg | ORAL_TABLET | Freq: Two times a day (BID) | ORAL | 0 refills | Status: DC

## 2022-07-07 NOTE — Progress Notes (Signed)

## 2022-07-07 NOTE — Progress Notes (Signed)
I have spent 5 minutes in review of e-visit questionnaire, review and updating patient chart, medical decision making and response to patient.   Neita Landrigan Cody Taviana Westergren, PA-C    

## 2022-07-27 ENCOUNTER — Encounter: Payer: Self-pay | Admitting: Family Medicine

## 2022-07-27 ENCOUNTER — Ambulatory Visit (INDEPENDENT_AMBULATORY_CARE_PROVIDER_SITE_OTHER): Payer: BC Managed Care – PPO | Admitting: Family Medicine

## 2022-07-27 VITALS — BP 132/86 | HR 88 | Temp 98.1°F | Resp 16 | Wt 339.6 lb

## 2022-07-27 DIAGNOSIS — F3289 Other specified depressive episodes: Secondary | ICD-10-CM

## 2022-07-27 DIAGNOSIS — E119 Type 2 diabetes mellitus without complications: Secondary | ICD-10-CM

## 2022-07-27 DIAGNOSIS — I10 Essential (primary) hypertension: Secondary | ICD-10-CM | POA: Diagnosis not present

## 2022-07-27 DIAGNOSIS — E785 Hyperlipidemia, unspecified: Secondary | ICD-10-CM

## 2022-07-27 DIAGNOSIS — Z7984 Long term (current) use of oral hypoglycemic drugs: Secondary | ICD-10-CM

## 2022-07-27 DIAGNOSIS — Z7985 Long-term (current) use of injectable non-insulin antidiabetic drugs: Secondary | ICD-10-CM

## 2022-07-27 NOTE — Progress Notes (Signed)
Established Patient Office Visit  Subjective    Patient ID: Kayla Flynn, female    DOB: 09-12-1990  Age: 32 y.o. MRN: [id number]  CC: No chief complaint on file.   HPI Kayla Flynn presents for routine follow up of chronic med issues. Patient denies acute complaints or concerns.    Outpatient Encounter Medications as of 07/27/2022  Medication Sig   Accu-Chek FastClix Lancets MISC 1 each by Other route 4 (four) times daily -  before meals and at bedtime.   albuterol (VENTOLIN HFA) 108 (90 Base) MCG/ACT inhaler Inhale 1-2 puffs into the lungs every 6 (six) hours as needed.   benzonatate (TESSALON) 100 MG capsule Take 1 capsule (100 mg total) by mouth 3 (three) times daily as needed.   Blood Glucose Monitoring Suppl (ACCU-CHEK GUIDE ME) w/Device KIT 1 kit by Other route 4 (four) times daily -  before meals and at bedtime.   fluticasone (FLONASE) 50 MCG/ACT nasal spray Place 2 sprays into both nostrils daily.   glucose blood (ACCU-CHEK GUIDE) test strip 1 each by Other route 4 (four) times daily -  before meals and at bedtime.   hydrochlorothiazide (HYDRODIURIL) 25 MG tablet Take 1 tablet (25 mg total) by mouth daily.   ibuprofen (ADVIL) 600 MG tablet Take 1 tablet (600 mg total) by mouth every 6 (six) hours as needed for mild pain or moderate pain.   Lancets Misc. (ACCU-CHEK FASTCLIX LANCET) KIT Use to check FSBS twice a day. Dx: E11.65 Z79.4   lisinopril (ZESTRIL) 20 MG tablet Take 1 tablet (20 mg total) by mouth daily.   metFORMIN (GLUCOPHAGE-XR) 750 MG 24 hr tablet Take 1 tablet (750 mg total) by mouth 2 (two) times daily with a meal.   mirtazapine (REMERON) 15 MG tablet Take 1 tablet (15 mg total) by mouth at bedtime.   naproxen (NAPROSYN) 500 MG tablet Take 1 tablet (500 mg total) by mouth 2 (two) times daily with a meal.   nicotine (NICODERM CQ - DOSED IN MG/24 HOURS) 14 mg/24hr patch Place 1 patch (14 mg total) onto the skin daily.   nicotine polacrilex (NICORETTE) 2 MG gum Chew 1  piece of gum every 2 to 4 hours (maximum: 24 pieces/day).   promethazine-dextromethorphan (PROMETHAZINE-DM) 6.25-15 MG/5ML syrup Take 5 mLs by mouth 4 (four) times daily as needed.   rosuvastatin (CRESTOR) 10 MG tablet Take 1 tablet (10 mg total) by mouth daily.   Semaglutide, 1 MG/DOSE, 4 MG/3ML SOPN Inject 1 mg as directed once a week.   [DISCONTINUED] cyclobenzaprine (FLEXERIL) 10 MG tablet Take 1 tablet (10 mg total) by mouth 3 (three) times daily as needed for muscle spasms.   No facility-administered encounter medications on file as of 07/27/2022.    Past Medical History:  Diagnosis Date   Acute appendicitis 07/18/2012   Hypertension    Obesity    Obesity, morbid, BMI 50 or higher (HCC) 07/18/2012   Severe preeclampsia 12/10/2014   Type 2 diabetes mellitus [facility])     Past Surgical History:  Procedure Laterality Date   APPENDECTOMY  07/17/2012   CESAREAN SECTION N/A 12/18/2014   Procedure: CESAREAN SECTION;  Surgeon: Tereso Newcomer, MD;  Location: [facility];  Service: Obstetrics;  Laterality: N/A;   LAPAROSCOPIC APPENDECTOMY N/A 07/17/2012   Procedure: APPENDECTOMY LAPAROSCOPIC;  Surgeon: Liz Malady, MD;  Location: [facility] OR;  Service: General;  Laterality: N/A;    Family History  Problem Relation Age of [place]   Hypertension Mother    Diabetes  Mother    Hypertension Maternal Grandmother     Social History   Socioeconomic History   Marital status: Single    Spouse name: Not on file   Number of children: Not on file   Years of education: Not on file   Highest education level: Some college, no degree  Occupational History   Not on file  Tobacco Use   Smoking status: Every Day    Packs/day: 0.25    Years: 1.20    Additional pack years: 0.00    Total pack years: 0.30    Types: Cigarettes    Passive exposure: Current   Smokeless tobacco: Never  Vaping Use   Vaping Use: Never used  Substance and Sexual Activity   Alcohol use: Yes    Comment: socially   Drug use: No    Sexual activity: Yes    Partners: Male    Birth control/protection: None  Other Topics Concern   Not on file  Social History Narrative   Not on file   Social Determinants of Health   Financial Resource Strain: Medium Risk (07/26/2022)   Overall Financial Resource Strain (CARDIA)    Difficulty of Paying Living Expenses: Somewhat hard  Food Insecurity: Food Insecurity Present (07/26/2022)   Hunger Vital Sign    Worried About Running Out of Food in the Last Year: Often true    Ran Out of Food in the Last Year: Sometimes true  Transportation Needs: No Transportation Needs (07/26/2022)   PRAPARE - Administrator, Civil Service (Medical): No    Lack of Transportation (Non-Medical): No  Physical Activity: Inactive (07/26/2022)   Exercise Vital Sign    Days of Exercise per Week: 0 days    Minutes of Exercise per Session: 0 min  Stress: No Stress Concern Present (07/26/2022)   [employer] of Occupational Health - Occupational Stress Questionnaire    Feeling of Stress : Not at all  Social Connections: Socially Isolated (07/26/2022)   Social Connection and Isolation Panel [NHANES]    Frequency of Communication with Friends and Family: Once a week    Frequency of Social Gatherings with Friends and Family: Never    Attends Religious Services: Never    Database administrator or Organizations: No    Attends Engineer, structural: 1 to 4 times per year    Marital Status: Never married  Intimate Partner Violence: Not At Risk (05/30/2022)   Humiliation, Afraid, Rape, and Kick questionnaire    Fear of Current or Ex-Partner: No    Emotionally Abused: No    Physically Abused: No    Sexually Abused: No    Review of Systems  All other systems reviewed and are negative.       Objective    BP 132/86   Pulse 88   Temp 98.1 F (36.7 C) (Oral)   Resp 16   Wt (!) 339 lb 9.6 oz (154 kg)   SpO2 96%   BMI 67.44 kg/m   Physical Exam Vitals and nursing note  reviewed.  Constitutional:      General: She is not in acute distress.    Appearance: She is obese.  Cardiovascular:     Rate and Rhythm: Normal rate and regular rhythm.  Pulmonary:     Effort: Pulmonary effort is normal.     Breath sounds: Normal breath sounds.  Abdominal:     Palpations: Abdomen is soft.     Tenderness: There is no abdominal tenderness.  Neurological:     General: No focal deficit present.     Mental Status: She is alert and oriented to person, place, and time.         Assessment & Plan:   1. Type 2 diabetes mellitus without complication, without long-term current use of insulin (HCC) Slightly increased A1c and not at goal. Discussed compliance  2. Essential hypertension Appears stable. Continue   3. Hyperlipidemia, unspecified hyperlipidemia type Continue   4. Other depression Continue   Return in about 3 months (around 10/27/2022) for follow up.   Tommie Raymond, MD

## 2022-07-27 NOTE — Progress Notes (Signed)
Patient is here for their 3 month follow-up Patient has no concerns today Care gaps have been discussed with patient  

## 2022-08-02 ENCOUNTER — Encounter: Payer: Self-pay | Admitting: Family Medicine

## 2022-08-05 ENCOUNTER — Ambulatory Visit (INDEPENDENT_AMBULATORY_CARE_PROVIDER_SITE_OTHER): Payer: BC Managed Care – PPO | Admitting: Family Medicine

## 2022-08-05 ENCOUNTER — Encounter: Payer: Self-pay | Admitting: Family Medicine

## 2022-08-05 VITALS — BP 168/84 | HR 86 | Temp 98.1°F | Resp 16 | Wt 336.8 lb

## 2022-08-05 DIAGNOSIS — Z6841 Body Mass Index (BMI) 40.0 and over, adult: Secondary | ICD-10-CM

## 2022-08-05 DIAGNOSIS — Z7984 Long term (current) use of oral hypoglycemic drugs: Secondary | ICD-10-CM

## 2022-08-05 DIAGNOSIS — I10 Essential (primary) hypertension: Secondary | ICD-10-CM | POA: Diagnosis not present

## 2022-08-05 DIAGNOSIS — Z7689 Persons encountering health services in other specified circumstances: Secondary | ICD-10-CM

## 2022-08-05 DIAGNOSIS — F17210 Nicotine dependence, cigarettes, uncomplicated: Secondary | ICD-10-CM

## 2022-08-05 DIAGNOSIS — E119 Type 2 diabetes mellitus without complications: Secondary | ICD-10-CM

## 2022-08-05 DIAGNOSIS — Z7985 Long-term (current) use of injectable non-insulin antidiabetic drugs: Secondary | ICD-10-CM

## 2022-08-05 DIAGNOSIS — Z720 Tobacco use: Secondary | ICD-10-CM

## 2022-08-05 MED ORDER — SEMAGLUTIDE (1 MG/DOSE) 4 MG/3ML ~~LOC~~ SOPN: 1.0000 mg | PEN_INJECTOR | SUBCUTANEOUS | 2 refills | Status: DC

## 2022-08-05 NOTE — Progress Notes (Signed)
Patient came in for monthly weight check. Patient has no other concerns today  

## 2022-08-05 NOTE — Progress Notes (Signed)
Established Patient Office Visit  Subjective    Patient ID: Kayla Flynn, female    DOB: 10/14/1990  Age: 32 y.o. MRN: [id number]  CC: No chief complaint on file.   HPI Kayla Flynn presents for weight management. She is possibly interested in bariatrics in the near future.    Outpatient Encounter Medications as of 08/05/2022  Medication Sig   Accu-Chek FastClix Lancets MISC 1 each by Other route 4 (four) times daily -  before meals and at bedtime.   albuterol (VENTOLIN HFA) 108 (90 Base) MCG/ACT inhaler Inhale 1-2 puffs into the lungs every 6 (six) hours as needed.   benzonatate (TESSALON) 100 MG capsule Take 1 capsule (100 mg total) by mouth 3 (three) times daily as needed.   Blood Glucose Monitoring Suppl (ACCU-CHEK GUIDE ME) w/Device KIT 1 kit by Other route 4 (four) times daily -  before meals and at bedtime.   fluticasone (FLONASE) 50 MCG/ACT nasal spray Place 2 sprays into both nostrils daily.   glucose blood (ACCU-CHEK GUIDE) test strip 1 each by Other route 4 (four) times daily -  before meals and at bedtime.   hydrochlorothiazide (HYDRODIURIL) 25 MG tablet Take 1 tablet (25 mg total) by mouth daily.   ibuprofen (ADVIL) 600 MG tablet Take 1 tablet (600 mg total) by mouth every 6 (six) hours as needed for mild pain or moderate pain.   Lancets Misc. (ACCU-CHEK FASTCLIX LANCET) KIT Use to check FSBS twice a day. Dx: E11.65 Z79.4   lisinopril (ZESTRIL) 20 MG tablet Take 1 tablet (20 mg total) by mouth daily.   metFORMIN (GLUCOPHAGE-XR) 750 MG 24 hr tablet Take 1 tablet (750 mg total) by mouth 2 (two) times daily with a meal.   mirtazapine (REMERON) 15 MG tablet Take 1 tablet (15 mg total) by mouth at bedtime.   naproxen (NAPROSYN) 500 MG tablet Take 1 tablet (500 mg total) by mouth 2 (two) times daily with a meal.   nicotine (NICODERM CQ - DOSED IN MG/24 HOURS) 14 mg/24hr patch Place 1 patch (14 mg total) onto the skin daily.   nicotine polacrilex (NICORETTE) 2 MG gum Chew 1 piece of  gum every 2 to 4 hours (maximum: 24 pieces/day).   promethazine-dextromethorphan (PROMETHAZINE-DM) 6.25-15 MG/5ML syrup Take 5 mLs by mouth 4 (four) times daily as needed.   rosuvastatin (CRESTOR) 10 MG tablet Take 1 tablet (10 mg total) by mouth daily.   Semaglutide, 1 MG/DOSE, 4 MG/3ML SOPN Inject 1 mg as directed once a week.   [DISCONTINUED] Semaglutide, 1 MG/DOSE, 4 MG/3ML SOPN Inject 1 mg as directed once a week.   No facility-administered encounter medications on file as of 08/05/2022.    Past Medical History:  Diagnosis Date   Acute appendicitis 07/18/2012   Hypertension    Obesity    Obesity, morbid, BMI 50 or higher (HCC) 07/18/2012   Severe preeclampsia 12/10/2014   Type 2 diabetes mellitus [facility])     Past Surgical History:  Procedure Laterality Date   APPENDECTOMY  07/17/2012   CESAREAN SECTION N/A 12/18/2014   Procedure: CESAREAN SECTION;  Surgeon: Tereso Newcomer, MD;  Location: [facility];  Service: Obstetrics;  Laterality: N/A;   LAPAROSCOPIC APPENDECTOMY N/A 07/17/2012   Procedure: APPENDECTOMY LAPAROSCOPIC;  Surgeon: Liz Malady, MD;  Location: [facility] OR;  Service: General;  Laterality: N/A;    Family History  Problem Relation Age of Onset   Hypertension Mother    Diabetes Mother    Hypertension Maternal Grandmother  Social History   Socioeconomic History   Marital status: Single    Spouse name: Not on file   Number of children: Not on file   Years of education: Not on file   Highest education level: Some college, no degree  Occupational History   Not on file  Tobacco Use   Smoking status: Every Day    Packs/day: 0.25    Years: 1.20    Additional pack years: 0.00    Total pack years: 0.30    Types: Cigarettes    Passive exposure: Current   Smokeless tobacco: Never  Vaping Use   Vaping Use: Never used  Substance and Sexual Activity   Alcohol use: Yes    Comment: socially   Drug use: No   Sexual activity: Yes    Partners: Male    Birth  control/protection: None  Other Topics Concern   Not on file  Social History Narrative   Not on file   Social Determinants of Health   Financial Resource Strain: Medium Risk (07/26/2022)   Overall Financial Resource Strain (CARDIA)    Difficulty of Paying Living Expenses: Somewhat hard  Food Insecurity: Food Insecurity Present (07/26/2022)   Hunger Vital Sign    Worried About Running Out of Food in the Last Year: Often true    Ran Out of Food in the Last Year: Sometimes true  Transportation Needs: No Transportation Needs (07/26/2022)   PRAPARE - Administrator, Civil Service (Medical): No    Lack of Transportation (Non-Medical): No  Physical Activity: Inactive (07/26/2022)   Exercise Vital Sign    Days of Exercise per Week: 0 days    Minutes of Exercise per Session: 0 min  Stress: No Stress Concern Present (07/26/2022)   [employer] of Occupational Health - Occupational Stress Questionnaire    Feeling of Stress : Not at all  Social Connections: Socially Isolated (07/26/2022)   Social Connection and Isolation Panel [NHANES]    Frequency of Communication with Friends and Family: Once a week    Frequency of Social Gatherings with Friends and Family: Never    Attends Religious Services: Never    Database administrator or Organizations: No    Attends Engineer, structural: 1 to 4 times per year    Marital Status: Never married  Intimate Partner Violence: Not At Risk (05/30/2022)   Humiliation, Afraid, Rape, and Kick questionnaire    Fear of Current or Ex-Partner: No    Emotionally Abused: No    Physically Abused: No    Sexually Abused: No    Review of Systems  All other systems reviewed and are negative.       Objective    BP (!) 168/84   Pulse 86   Temp 98.1 F (36.7 C) (Oral)   Resp 16   Wt (!) 336 lb 12.8 oz (152.8 kg)   SpO2 97%   BMI 66.89 kg/m   Physical Exam Vitals and nursing note reviewed.  Constitutional:      General: She is not  in acute distress.    Appearance: She is obese.  Cardiovascular:     Rate and Rhythm: Normal rate and regular rhythm.  Pulmonary:     Effort: Pulmonary effort is normal.     Breath sounds: Normal breath sounds.  Abdominal:     Palpations: Abdomen is soft.     Tenderness: There is no abdominal tenderness.  Neurological:     General: No focal deficit present.  Mental Status: She is alert and oriented to person, place, and time.         Assessment & Plan:   1. Type 2 diabetes mellitus without complication, without long-term current use of insulin (HCC) Ozempic refilled.  - Semaglutide, 1 MG/DOSE, 4 MG/3ML SOPN; Inject 1 mg as directed once a week.  Dispense: 3 mL; Refill: 2  2. Encounter for weight management Ozempic refilled.   3. Class 3 severe obesity due to excess calories with serious comorbidity and body mass index (BMI) of 60.0 to 69.9 in adult (HCC)   4. Essential hypertension Elevated readings. monitor  5. Tobacco abuse Discussed reduction/cessation    Return in about 4 weeks (around 09/02/2022) for follow up.   Tommie Raymond, MD

## 2022-08-30 ENCOUNTER — Ambulatory Visit: Payer: BC Managed Care – PPO | Attending: Family Medicine | Admitting: Pharmacist

## 2022-08-30 ENCOUNTER — Other Ambulatory Visit: Payer: Self-pay

## 2022-08-30 ENCOUNTER — Encounter: Payer: Self-pay | Admitting: Family Medicine

## 2022-08-30 DIAGNOSIS — E119 Type 2 diabetes mellitus without complications: Secondary | ICD-10-CM

## 2022-08-30 DIAGNOSIS — Z7984 Long term (current) use of oral hypoglycemic drugs: Secondary | ICD-10-CM

## 2022-08-30 DIAGNOSIS — Z7985 Long-term (current) use of injectable non-insulin antidiabetic drugs: Secondary | ICD-10-CM

## 2022-08-30 LAB — POCT GLYCOSYLATED HEMOGLOBIN (HGB A1C): HbA1c, POC (controlled diabetic range): 6.8 % (ref 0.0–7.0)

## 2022-08-30 NOTE — Progress Notes (Signed)
    S:     No chief complaint on file.  32 y.o. female who presents for diabetes evaluation, education, and management in the context of the LIBERATE Study.  PMH is significant for HTN, T2DM, obesity, HLD, tobacco use, MDD.  Patient was initially referred and last seen by Primary Care Provider, Dr. Andrey Campanile, on 03/21/2022. She has been seen several times by her PCP and myself since. We enrolled her in LIBERATE 05/2022.   Today, patient arrives in good spirits and presents without any assistance.   Family/Social History:  Fhx: HTN, DM  Tobacco: current 0.25 PPD smoker  Alcohol: none reported   Current diabetes medications include: metformin 750 mg XR BID, Ozempic 1 mg weekly  Patient reports adherence to taking all medications as prescribed.   Insurance coverage: BCBS  Patient denies hypoglycemic events.  Patient denies nocturia (nighttime urination).  Patient denies neuropathy (nerve pain). Patient denies visual changes. Patient reports self foot exams.   Patient reported dietary habits:  -No changes in diet.   Patient-reported exercise habits:  -Sedentary but plans to increase   O:    Lab Results  Component Value Date   HGBA1C 6.8 08/30/2022   Lipid Panel     Component Value Date/Time   CHOL 203 (H) 04/21/2022 0954   TRIG 255 (H) 04/21/2022 0954   HDL 31 (L) 04/21/2022 0954   CHOLHDL 6.5 (H) 04/21/2022 0954   LDLCALC 127 (H) 04/21/2022 0954   LDLDIRECT 128 (H) 12/05/2019 1424    Clinical Atherosclerotic Cardiovascular Disease (ASCVD): No  The ASCVD Risk score (Arnett DK, et al., 2019) failed to calculate for the following reasons:   The 2019 ASCVD risk score is only valid for ages 27 to 109   Patient is participating in a Managed Medicaid Plan:  No    A/P:  LIBERATE Study:  - 8 sensors provided for a 3 month supply. Educated to contact the office if the sensor falls off early and replacements are needed before their next [employer].   Diabetes  longstanding currently controlled. Patient is able to verbalize appropriate hypoglycemia management plan. Medication adherence appears appropriate. Will start PA process for Mounjaro. -Continued Ozempic 1 mg weekly.  -Continued metformin 750 mg XR BID.   -Extensively discussed pathophysiology of diabetes, recommended lifestyle interventions, dietary effects on blood sugar control.  -Counseled on s/sx of and management of hypoglycemia.  -Next A1c anticipated 11/2022.   Written patient instructions provided. Patient verbalized understanding of treatment plan.  Total time in face to face counseling 30 minutes.    Follow-up:  Pharmacist in 4-6 weeks. LIBERATE #3 in September.   Butch Penny, PharmD, Patsy [place], CPP Clinical Pharmacist [facility] [phone]

## 2022-08-31 ENCOUNTER — Other Ambulatory Visit: Payer: Self-pay

## 2022-09-01 ENCOUNTER — Other Ambulatory Visit: Payer: Self-pay | Admitting: Pharmacist

## 2022-09-01 ENCOUNTER — Other Ambulatory Visit: Payer: Self-pay | Admitting: Family Medicine

## 2022-09-01 ENCOUNTER — Other Ambulatory Visit: Payer: Self-pay

## 2022-09-01 MED ORDER — TIRZEPATIDE 7.5 MG/0.5ML ~~LOC~~ SOAJ: 7.5000 mg | SUBCUTANEOUS | 0 refills | Status: DC

## 2022-09-01 MED ORDER — TIRZEPATIDE 5 MG/0.5ML ~~LOC~~ SOAJ: 5.0000 mg | SUBCUTANEOUS | 0 refills | Status: DC

## 2022-09-01 MED ORDER — TIRZEPATIDE 2.5 MG/0.5ML ~~LOC~~ SOAJ: 2.5000 mg | SUBCUTANEOUS | 0 refills | Status: DC

## 2022-09-01 NOTE — Telephone Encounter (Signed)
Requested medication (s) are due for refill today - no  Requested medication (s) are on the active medication list -yes  Future visit scheduled -yes  Last refill: 09/01/22 6ml  Notes to clinic: Pharmacy request: Please review Rx dosing- see note on Rx  Requested Prescriptions  Pending Prescriptions Disp Refills   MOUNJARO 7.5 MG/0.5ML Pen [Pharmacy Med Name: MOUNJARO 7.5 MG/0.5 ML PEN]  0    Sig: INJECT 7.5 MG SUBCUTANEOUSLY WEEKLY     Off-Protocol Failed - 09/01/2022  3:28 PM      Failed - Medication not assigned to a protocol, review manually.      Passed - Valid encounter within last 12 months    Recent Outpatient Visits           2 days ago Type 2 diabetes mellitus without complication, without long-term current use of insulin (HCC)   [place] [facility] [place], Thibodaux L, RPH-CPP   3 weeks ago Type 2 diabetes mellitus without complication, without long-term current use of insulin (HCC)   [place] [facility], Lauris Poag, MD   1 month ago Type 2 diabetes mellitus without complication, without long-term current use of insulin (HCC)   [place] [facility], Lauris Poag, MD   3 months ago Type 2 diabetes mellitus without complication, without long-term current use of insulin [facility])   [place] [facility] [place], Cornelius Moras, RPH-CPP   4 months ago Essential hypertension   [place] [facility], MD       Future Appointments             In 6 days Georganna Skeans, MD [facility]   In 3 weeks Lois Huxley, Cornelius Moras, RPH-CPP [facility]   In 2 months Lois Huxley, Cornelius Moras, RPH-CPP [facility]               Requested Prescriptions  Pending Prescriptions Disp Refills   MOUNJARO 7.5 MG/0.5ML Pen [Pharmacy Med Name: MOUNJARO 7.5  MG/0.5 ML PEN]  0    Sig: INJECT 7.5 MG SUBCUTANEOUSLY WEEKLY     Off-Protocol Failed - 09/01/2022  3:28 PM      Failed - Medication not assigned to a protocol, review manually.      Passed - Valid encounter within last 12 months    Recent Outpatient Visits           2 days ago Type 2 diabetes mellitus without complication, without long-term current use of insulin [facility])   [place] [facility] [place], Cyrus L, RPH-CPP   3 weeks ago Type 2 diabetes mellitus without complication, without long-term current use of insulin [facility])   [place] [facility], Lauris Poag, MD   1 month ago Type 2 diabetes mellitus without complication, without long-term current use of insulin [facility])   [place] [facility], Lauris Poag, MD   3 months ago Type 2 diabetes mellitus without complication, without long-term current use of insulin [facility])   [place] [facility] [place], Cornelius Moras, RPH-CPP   4 months ago Essential hypertension   [place] [facility], MD       Future Appointments  In 6 days Georganna Skeans, MD [facility]   In 3 weeks Lois Huxley, Cornelius Moras, RPH-CPP [facility]   In 2 months Lois Huxley, Cornelius Moras, RPH-CPP [facility]

## 2022-09-02 ENCOUNTER — Other Ambulatory Visit: Payer: Self-pay

## 2022-09-02 ENCOUNTER — Encounter: Payer: Self-pay | Admitting: Pharmacist

## 2022-09-02 ENCOUNTER — Other Ambulatory Visit: Payer: Self-pay | Admitting: Family Medicine

## 2022-09-07 ENCOUNTER — Encounter: Payer: Self-pay | Admitting: Family Medicine

## 2022-09-07 ENCOUNTER — Ambulatory Visit (INDEPENDENT_AMBULATORY_CARE_PROVIDER_SITE_OTHER): Payer: BC Managed Care – PPO | Admitting: Family Medicine

## 2022-09-07 VITALS — BP 128/81 | HR 71 | Temp 98.1°F | Resp 16 | Wt 334.0 lb

## 2022-09-07 DIAGNOSIS — Z7984 Long term (current) use of oral hypoglycemic drugs: Secondary | ICD-10-CM

## 2022-09-07 DIAGNOSIS — E119 Type 2 diabetes mellitus without complications: Secondary | ICD-10-CM

## 2022-09-07 DIAGNOSIS — Z7689 Persons encountering health services in other specified circumstances: Secondary | ICD-10-CM

## 2022-09-07 DIAGNOSIS — I10 Essential (primary) hypertension: Secondary | ICD-10-CM | POA: Diagnosis not present

## 2022-09-07 DIAGNOSIS — Z720 Tobacco use: Secondary | ICD-10-CM

## 2022-09-07 DIAGNOSIS — Z6841 Body Mass Index (BMI) 40.0 and over, adult: Secondary | ICD-10-CM

## 2022-09-07 NOTE — Progress Notes (Signed)
Patient is here for their follow-up for DM for weight loss Patient has no concerns today Care gaps have been discussed with patient

## 2022-09-07 NOTE — Progress Notes (Signed)
Established Patient Office Visit  Subjective    Patient ID: Kayla Flynn, female    DOB: 1990/08/27  Age: 32 y.o. MRN: [id number]  CC: No chief complaint on file.   HPI Kayla Flynn presents for follow up of chronic med issues   Outpatient Encounter Medications as of 09/07/2022  Medication Sig   Accu-Chek FastClix Lancets MISC 1 each by Other route 4 (four) times daily -  before meals and at bedtime.   albuterol (VENTOLIN HFA) 108 (90 Base) MCG/ACT inhaler Inhale 1-2 puffs into the lungs every 6 (six) hours as needed.   benzonatate (TESSALON) 100 MG capsule Take 1 capsule (100 mg total) by mouth 3 (three) times daily as needed.   Blood Glucose Monitoring Suppl (ACCU-CHEK GUIDE ME) w/Device KIT 1 kit by Other route 4 (four) times daily -  before meals and at bedtime.   fluticasone (FLONASE) 50 MCG/ACT nasal spray Place 2 sprays into both nostrils daily.   glucose blood (ACCU-CHEK GUIDE) test strip 1 each by Other route 4 (four) times daily -  before meals and at bedtime.   hydrochlorothiazide (HYDRODIURIL) 25 MG tablet Take 1 tablet (25 mg total) by mouth daily.   ibuprofen (ADVIL) 600 MG tablet Take 1 tablet (600 mg total) by mouth every 6 (six) hours as needed for mild pain or moderate pain.   Lancets Misc. (ACCU-CHEK FASTCLIX LANCET) KIT Use to check FSBS twice a day. Dx: E11.65 Z79.4   lisinopril (ZESTRIL) 20 MG tablet Take 1 tablet (20 mg total) by mouth daily.   metFORMIN (GLUCOPHAGE-XR) 750 MG 24 hr tablet Take 1 tablet (750 mg total) by mouth 2 (two) times daily with a meal.   mirtazapine (REMERON) 15 MG tablet Take 1 tablet (15 mg total) by mouth at bedtime.   naproxen (NAPROSYN) 500 MG tablet Take 1 tablet (500 mg total) by mouth 2 (two) times daily with a meal.   nicotine (NICODERM CQ - DOSED IN MG/24 HOURS) 14 mg/24hr patch Place 1 patch (14 mg total) onto the skin daily.   nicotine polacrilex (NICORETTE) 2 MG gum Chew 1 piece of gum every 2 to 4 hours (maximum: 24 pieces/day).    promethazine-dextromethorphan (PROMETHAZINE-DM) 6.25-15 MG/5ML syrup Take 5 mLs by mouth 4 (four) times daily as needed.   rosuvastatin (CRESTOR) 10 MG tablet Take 1 tablet (10 mg total) by mouth daily.   Semaglutide, 1 MG/DOSE, 4 MG/3ML SOPN Inject 1 mg as directed once a week.   tirzepatide [facility]) 2.5 MG/0.5ML Pen Inject 2.5 mg into the skin once a week. For 4 weeks.   tirzepatide [facility]) 5 MG/0.5ML Pen Inject 5 mg into the skin once a week. For 4 weeks. Start once you complete 4 weeks of the 2.5 mg weekly dose.   tirzepatide (MOUNJARO) 7.5 MG/0.5ML Pen Inject 7.5 mg into the skin once a week. For 4 weeks. Start once you complete 4 weeks of the 2.5mg  and 5mg  doses.   No facility-administered encounter medications on file as of 09/07/2022.    Past Medical History:  Diagnosis Date   Acute appendicitis 07/18/2012   Hypertension    Obesity    Obesity, morbid, BMI 50 or higher (HCC) 07/18/2012   Severe preeclampsia 12/10/2014   Type 2 diabetes mellitus [facility])     Past Surgical History:  Procedure Laterality Date   APPENDECTOMY  07/17/2012   CESAREAN SECTION N/A 12/18/2014   Procedure: CESAREAN SECTION;  Surgeon: Tereso Newcomer, MD;  Location: [facility];  Service: Obstetrics;  Laterality: N/A;   LAPAROSCOPIC APPENDECTOMY N/A 07/17/2012   Procedure: APPENDECTOMY LAPAROSCOPIC;  Surgeon: Liz Malady, MD;  Location: [facility] OR;  Service: General;  Laterality: N/A;    Family History  Problem Relation Age of Onset   Hypertension Mother    Diabetes Mother    Hypertension Maternal Grandmother     Social History   Socioeconomic History   Marital status: Single    Spouse name: Not on file   Number of children: Not on file   Years of education: Not on file   Highest education level: Some college, no degree  Occupational History   Not on file  Tobacco Use   Smoking status: Every Day    Packs/day: 0.25    Years: 1.20    Additional pack years: 0.00    Total pack years: 0.30     Types: Cigarettes    Passive exposure: Current   Smokeless tobacco: Never  Vaping Use   Vaping Use: Never used  Substance and Sexual Activity   Alcohol use: Yes    Comment: socially   Drug use: No   Sexual activity: Yes    Partners: Male    Birth control/protection: None  Other Topics Concern   Not on file  Social History Narrative   Not on file   Social Determinants of Health   Financial Resource Strain: Medium Risk (07/26/2022)   Overall Financial Resource Strain (CARDIA)    Difficulty of Paying Living Expenses: Somewhat hard  Food Insecurity: Food Insecurity Present (07/26/2022)   Hunger Vital Sign    Worried About Running Out of Food in the Last Year: Often true    Ran Out of Food in the Last Year: Sometimes true  Transportation Needs: No Transportation Needs (07/26/2022)   PRAPARE - Administrator, Civil Service (Medical): No    Lack of Transportation (Non-Medical): No  Physical Activity: Inactive (07/26/2022)   Exercise Vital Sign    Days of Exercise per Week: 0 days    Minutes of Exercise per Session: 0 min  Stress: No Stress Concern Present (07/26/2022)   [employer] of Occupational Health - Occupational Stress Questionnaire    Feeling of Stress : Not at all  Social Connections: Socially Isolated (07/26/2022)   Social Connection and Isolation Panel [NHANES]    Frequency of Communication with Friends and Family: Once a week    Frequency of Social Gatherings with Friends and Family: Never    Attends Religious Services: Never    Database administrator or Organizations: No    Attends Engineer, structural: 1 to 4 times per year    Marital Status: Never married  Intimate Partner Violence: Not At Risk (05/30/2022)   Humiliation, Afraid, Rape, and Kick questionnaire    Fear of Current or Ex-Partner: No    Emotionally Abused: No    Physically Abused: No    Sexually Abused: No    Review of Systems  All other systems reviewed and are  negative.       Objective    BP 128/81   Pulse 71   Temp 98.1 F (36.7 C) (Oral)   Resp 16   Wt (!) 334 lb (151.5 kg)   SpO2 96%   BMI 66.33 kg/m   Physical Exam Vitals and nursing note reviewed.  Constitutional:      General: She is not in acute distress.    Appearance: She is obese.  Cardiovascular:     Rate and Rhythm:  Normal rate and regular rhythm.  Pulmonary:     Effort: Pulmonary effort is normal.     Breath sounds: Normal breath sounds.  Abdominal:     Palpations: Abdomen is soft.     Tenderness: There is no abdominal tenderness.  Neurological:     General: No focal deficit present.     Mental Status: She is alert and oriented to person, place, and time.         Assessment & Plan:   1. Type 2 diabetes mellitus without complication, without long-term current use of insulin (HCC) A1c now at goal. Continue   2. Encounter for weight management Patient on monjauro  3. Class 3 severe obesity due to excess calories with serious comorbidity and body mass index (BMI) of 60.0 to 69.9 in adult (HCC)   4. Essential hypertension Appears stable. continue  5. Tobacco abuse Discussed reduction/cessation    Return in about 3 months (around 12/08/2022) for follow up.   Tommie Raymond, MD

## 2022-09-27 ENCOUNTER — Encounter: Payer: Self-pay | Admitting: Pharmacist

## 2022-09-27 ENCOUNTER — Other Ambulatory Visit: Payer: Self-pay | Admitting: Pharmacist

## 2022-09-27 ENCOUNTER — Ambulatory Visit: Payer: BC Managed Care – PPO | Attending: Family Medicine | Admitting: Pharmacist

## 2022-09-27 ENCOUNTER — Other Ambulatory Visit: Payer: Self-pay | Admitting: Family Medicine

## 2022-09-27 DIAGNOSIS — Z7984 Long term (current) use of oral hypoglycemic drugs: Secondary | ICD-10-CM | POA: Diagnosis not present

## 2022-09-27 DIAGNOSIS — Z7985 Long-term (current) use of injectable non-insulin antidiabetic drugs: Secondary | ICD-10-CM | POA: Diagnosis not present

## 2022-09-27 DIAGNOSIS — E119 Type 2 diabetes mellitus without complications: Secondary | ICD-10-CM | POA: Diagnosis not present

## 2022-09-27 MED ORDER — TIRZEPATIDE 5 MG/0.5ML ~~LOC~~ SOAJ: 5.0000 mg | SUBCUTANEOUS | 0 refills | Status: DC

## 2022-09-27 NOTE — Progress Notes (Signed)
    S:     No chief complaint on file.  32 y.o. female who presents for diabetes evaluation, education, and management. PMH is significant for HTN, T2DM, obesity, HLD, tobacco use, MDD.   Patient was initially referred and last seen by Primary Care Provider, Dr. Andrey Campanile, on 03/21/2022. She has been seen several times by her PCP and myself since. We enrolled her in LIBERATE 05/2022. I last saw her on 08/30/22 and we changed her Ozempic to [facility].  Today, patient arrives in good spirits and presents without any assistance. Unfortunately, her insurance is only covering Mounjaro if we start her off at the 2.5 mg weekly dose, despite her being on Ozempic 1mg  weekly prior. She is tolerating the [facility] well. Constipation occurred with initial dosing but has resolved. Her sugar control remains excellent.    Family/Social History:  Fhx: HTN, DM  Tobacco: current 0.25 PPD smoker  Alcohol: none reported   Current diabetes medications include: metformin 750 mg XR BID, Mounjaro 2.5 mg weekly  Patient reports adherence to taking all medications as prescribed.   Insurance coverage: BCBS  Patient denies hypoglycemic events.  Patient denies nocturia (nighttime urination).  Patient denies neuropathy (nerve pain). Patient denies visual changes. Patient reports self foot exams.    Patient reported dietary habits:  -Notes some increase in appetite since changing to [facility].   Patient-reported exercise habits:  -Sedentary but plans to increase   O:    Lab Results  Component Value Date   HGBA1C 6.8 08/30/2022   Lipid Panel     Component Value Date/Time   CHOL 203 (H) 04/21/2022 0954   TRIG 255 (H) 04/21/2022 0954   HDL 31 (L) 04/21/2022 0954   CHOLHDL 6.5 (H) 04/21/2022 0954   LDLCALC 127 (H) 04/21/2022 0954   LDLDIRECT 128 (H) 12/05/2019 1424    Clinical Atherosclerotic Cardiovascular Disease (ASCVD): No  The ASCVD Risk score (Arnett DK, et al., 2019) failed to calculate for the  following reasons:   The 2019 ASCVD risk score is only valid for ages 32 to 66   Patient is participating in a Managed Medicaid Plan:  No    A/P: Diabetes longstanding currently controlled. Patient is able to verbalize appropriate hypoglycemia management plan. Medication adherence appears appropriate. Will have her increase to [facility] 5mg  weekly.  -Increase dose of Mounjaro to 5 mg weekly.  -Continued metformin 750 mg XR BID.   -Extensively discussed pathophysiology of diabetes, recommended lifestyle interventions, dietary effects on blood sugar control.  -Counseled on s/sx of and management of hypoglycemia.  -Next A1c anticipated 11/2022.   Written patient instructions provided. Patient verbalized understanding of treatment plan.  Total time in face to face counseling 30 minutes.    Follow-up:  Pharmacist in 4-6 weeks. LIBERATE #3 in September.   Butch Penny, PharmD, Patsy [place], CPP Clinical Pharmacist [facility] [phone]

## 2022-10-31 NOTE — Progress Notes (Unsigned)
S:     No chief complaint on file.  32 y.o. female who presents for diabetes evaluation, education, and management.   Patient was referred and last seen by Primary Care Provider, Dr. Andrey Campanile, on 09/07/22. Patient was last seen by Pharmacy on 09/27/22. Patient was enrolled in the LIBERATE study 05/2022.   PMH is significant for HTN, T2DM, obesity, HLD, tobacco use, MDD.   At last visit, Mounjaro was increased from 2.5 to 5 mg weekly. She previously tolerated Ozempic 1 mg, switched to [facility] for additional weight loss. However, insurance required re-titration starting at 2.5 mg Mounjaro. Some constipation noted with initial dose of Mounjaro, resolved. Most recent A1c 6.8%, down from 8.2% 05/2022.   CGM data from 09/27/22: GMI: 6.7% 95% TIR, 5% high, 0% low/very high  Today, patient arrives in good spirits and presents without any assistance. She reported not using CGM in the past week due to a sensor falling off. GMI from previous weeks 6.3%, improved from 6.7% on 09/27/22. Doing well on Mounjaro 5 mg and ready to go up to 7.5 today.   Family/Social History:  Fhx: HTN, DM Tobacco: 12-14 cigarettes/day. Lucky strike brand. Motivated to quit.  Alcohol: none reported  Current diabetes medications include: metformin 750 mg XR BID, Mounjaro 5 mg weekly Current hypertension medications include: HCTZ 25 mg, lisinopril 20 mg Current hyperlipidemia medications include: rosuvastatin 10 mg. Not taking  Patient reports adherence to taking all DM medications as prescribed. Reports never starting rosuvastatin 10 mg, and hydrochlorothiazide was removed in 04/2022.   Insurance coverage: BCBS primary, Medicaid secondary  Patient denies hypoglycemic events.  Reported home fasting blood sugars: 100-110. One BG of 60, treated w/ Werther candy. No s/sx.  Reported 2 hour post-meal/random blood sugars: 120s.  Patient denies nocturia (nighttime urination).  Patient denies neuropathy (nerve  pain). Patient denies visual changes. Patient reports self foot exams.   Patient reported dietary habits:  Lunch: typically biggest meal Reported enjoying foods like baked chicken, salmon, shrimp, zucchini, squash. Cutting back on carbs  Patient-reported exercise habits: not discussed this visit  O:   ROS  Physical Exam  CGM data limited, not used in the past week:  Glucose Management Indicator: 6.3%  Observed patterns: consistent spike after lunch   Lab Results  Component Value Date   HGBA1C 6.8 08/30/2022   Vitals:   11/01/22 0915  BP: 130/83    Lipid Panel     Component Value Date/Time   CHOL 203 (H) 04/21/2022 0954   TRIG 255 (H) 04/21/2022 0954   HDL 31 (L) 04/21/2022 0954   CHOLHDL 6.5 (H) 04/21/2022 0954   LDLCALC 127 (H) 04/21/2022 0954   LDLDIRECT 128 (H) 12/05/2019 1424    Clinical Atherosclerotic Cardiovascular Disease (ASCVD): No  The ASCVD Risk score (Arnett DK, et al., 2019) failed to calculate for the following reasons:   The 2019 ASCVD risk score is only valid for ages 63 to 75   Patient is participating in a Managed Medicaid Plan:  No   A/P: Diabetes longstanding currently controlled based on A1c 6.8%. Patient is able to verbalize appropriate hypoglycemia management plan. Medication adherence appears appropriate. Patients main goal is weight loss. Reported good diet changes, tolerating Mounjaro, and strong desire to stop smoking.  -Increased dose of Mounjaro (tirzepatide) from 5 mg to 7.5 mg weekly  -Continued metformin 750 mg XR BID.  -Patient educated on purpose, proper use, and potential adverse effects of Mounjaro.  -Extensively discussed pathophysiology of diabetes, recommended  lifestyle interventions, dietary effects on blood sugar control.  -Counseled on s/sx of and management of hypoglycemia.  -Next A1c anticipated 11/30/22.   ASCVD risk - primary prevention in patient with diabetes. Last LDL was 127 on 05/30/22, not at goal of <70  mg/dL. high intensity statin indicated. Was prescribed rosuvastatin 10 mg 04/2022, but never started. -repeat lipid panel today--tailor statin therapy based on results  Hypertension longstanding currently controlled. Blood pressure goal of <130/80 mmHg. Medication adherence appropriate. Patient reported hydrochlorothiazide was d/c'd by Dr. Andrey Campanile 04/2022. Patient reported she is motivated to quit smoking and experienced minimal benefit with 14 mg nicotine patch + 2mg  gum prescribed by Dr. Andrey Campanile. She has quit successfully in 2016 (cold [place]).  -Continued lisinopril 20 mg tablet daily. -Discontinued hydrochlorothiazide 25 mg tablet. -Increased nicotine patches from 14 to 21 mg daily  Written patient instructions provided. Patient verbalized understanding of treatment plan.  Total time in face to face counseling 30 minutes.    Follow-up:  Pharmacist in 3 weeks PCP clinic visit in 5 weeks Patient seen with Rosina Lowenstein, PharmD Candidate  Butch Penny, PharmD, BCACP, CPP Clinical Pharmacist [facility] & [facility] [phone]

## 2022-11-01 ENCOUNTER — Other Ambulatory Visit: Payer: Self-pay

## 2022-11-01 ENCOUNTER — Encounter: Payer: Self-pay | Admitting: Family Medicine

## 2022-11-01 ENCOUNTER — Encounter: Payer: Self-pay | Admitting: Pharmacist

## 2022-11-01 ENCOUNTER — Ambulatory Visit: Payer: BC Managed Care – PPO | Attending: Family Medicine | Admitting: Pharmacist

## 2022-11-01 VITALS — BP 130/83

## 2022-11-01 DIAGNOSIS — Z7985 Long-term (current) use of injectable non-insulin antidiabetic drugs: Secondary | ICD-10-CM

## 2022-11-01 DIAGNOSIS — Z716 Tobacco abuse counseling: Secondary | ICD-10-CM

## 2022-11-01 DIAGNOSIS — Z7984 Long term (current) use of oral hypoglycemic drugs: Secondary | ICD-10-CM

## 2022-11-01 DIAGNOSIS — E119 Type 2 diabetes mellitus without complications: Secondary | ICD-10-CM

## 2022-11-01 DIAGNOSIS — E785 Hyperlipidemia, unspecified: Secondary | ICD-10-CM | POA: Diagnosis not present

## 2022-11-01 DIAGNOSIS — I10 Essential (primary) hypertension: Secondary | ICD-10-CM | POA: Diagnosis not present

## 2022-11-01 MED ORDER — TIRZEPATIDE 12.5 MG/0.5ML ~~LOC~~ SOAJ
12.5000 mg | SUBCUTANEOUS | 0 refills | Status: AC
Start: 2022-11-01 — End: ?
  Filled 2022-11-01 – 2023-01-20 (×2): qty 2, 28d supply, fill #0

## 2022-11-01 MED ORDER — TIRZEPATIDE 15 MG/0.5ML ~~LOC~~ SOAJ
15.0000 mg | SUBCUTANEOUS | 1 refills | Status: AC
Start: 2022-11-01 — End: ?
  Filled 2022-11-01: qty 6, 84d supply, fill #0
  Filled 2023-02-27: qty 2, 28d supply, fill #0
  Filled 2023-04-06: qty 2, 28d supply, fill #1

## 2022-11-01 MED ORDER — TIRZEPATIDE 7.5 MG/0.5ML ~~LOC~~ SOPN
7.5000 mg | PEN_INJECTOR | SUBCUTANEOUS | 0 refills | Status: AC
Start: 2022-11-01 — End: ?
  Filled 2022-11-01: qty 2, 28d supply, fill #0

## 2022-11-01 MED ORDER — NICOTINE 14 MG/24HR TD PT24: 14.0000 mg | MEDICATED_PATCH | Freq: Every day | TRANSDERMAL | 1 refills | Status: DC

## 2022-11-01 MED ORDER — NICOTINE 21 MG/24HR TD PT24: 21.0000 mg | MEDICATED_PATCH | Freq: Every day | TRANSDERMAL | 0 refills | Status: AC

## 2022-11-01 MED ORDER — TIRZEPATIDE 10 MG/0.5ML ~~LOC~~ SOPN
10.0000 mg | PEN_INJECTOR | SUBCUTANEOUS | 0 refills | Status: AC
Start: 2022-11-01 — End: ?
  Filled 2022-11-01 – 2022-12-22 (×3): qty 2, 28d supply, fill #0

## 2022-11-01 MED ORDER — ROSUVASTATIN CALCIUM 10 MG PO TABS
10.0000 mg | ORAL_TABLET | Freq: Every day | ORAL | 1 refills | Status: AC
Start: 2022-11-01 — End: ?

## 2022-11-02 LAB — LIPID PANEL
Chol/HDL Ratio: 6.3 ratio — ABNORMAL HIGH (ref 0.0–4.4)
Cholesterol, Total: 201 mg/dL — ABNORMAL HIGH (ref 100–199)
HDL: 32 mg/dL — ABNORMAL LOW (ref >39)
LDL Chol Calc (NIH): 139 mg/dL — ABNORMAL HIGH (ref 0–99)
Triglycerides: 165 mg/dL — ABNORMAL HIGH (ref 0–149)
VLDL Cholesterol Cal: 30 mg/dL (ref 5–40)

## 2022-11-21 ENCOUNTER — Other Ambulatory Visit: Payer: Self-pay | Admitting: Family Medicine

## 2022-11-22 NOTE — Telephone Encounter (Signed)
Refused Mounjaro 5 mg because it was discontinued on 11/01/2022.

## 2022-11-23 NOTE — Progress Notes (Signed)
S:     No chief complaint on file.  32 y.o. female who presents for diabetes evaluation, education, and management in the context of the LIBERATE study (visit #3). Patient was referred and last seen by Primary Care Provider, Dr. Andrey Campanile, on 09/07/22. Patient was last seen by Pharmacy clinic on 11/01/22.  PMH is significant for T2DM, HTN, HLD, obesity, tob use, MDD.   At last visit, Mounjaro was increased to 7.5 mg, nicotine 21 mg patches were prescribed, and a lipid panel was ran (LDL 139, TG 165, TC 201).   Today, Patient arrives in good spirits and presents without any assistance. Patient endorses diarrhea and bloating/belching with Mounjaro 7.5 mg weekly, but has steadied after 3rd dose. Denies vision changes, abdominal pain. Patient started rosvustatin 10 mg following lipid results on 11/01/22.  Family/Social History:  Fhx: HTN, DM Tobacco: 12-14 cigarettes/day. Lucky strike brand. Motivated to quit.  Alcohol: none reported  Current diabetes medications include: metformin XR 750 mg BID, Mounjaro 7.5 mg weekly Current hypertension medications include: lisinopril 20 mg daily Current hyperlipidemia medications include: rosuvastatin 10 mg daily  Patient reports adherence to taking all medications as prescribed.   Insurance coverage: BCBS, [place] [employer]  Patient denies hypoglycemic events.  Reported home fasting blood sugars: 80s-90s  Reported 2 hour post-meal/random blood sugars: 130s.  Patient denies nocturia (nighttime urination).  Patient denies neuropathy (nerve pain). Patient denies visual changes. Patient reports self foot exams.   O:   ROS  Physical Exam  Libre3 CGM Download today 08/27/22 - 11/24/22 % Time CGM is active: 60% Average Glucose: 127 mg/dL Glucose Management Indicator: 6.3  Glucose Variability: 17.6% (goal <36%) Time in Goal:  - Time in range 70-180: 98% - Time above range: 2% (180-250) - Time below range: 0%  Lab Results  Component Value  Date   HGBA1C 5.9 11/24/2022   Vitals:   11/24/22 0927  BP: 139/86  Pulse: 71    Lipid Panel     Component Value Date/Time   CHOL 201 (H) 11/01/2022 0921   TRIG 165 (H) 11/01/2022 0921   HDL 32 (L) 11/01/2022 0921   CHOLHDL 6.3 (H) 11/01/2022 0921   LDLCALC 139 (H) 11/01/2022 0921   LDLDIRECT 128 (H) 12/05/2019 1424    Clinical Atherosclerotic Cardiovascular Disease (ASCVD): No  The ASCVD Risk score (Arnett DK, et al., 2019) failed to calculate for the following reasons:   The 2019 ASCVD risk score is only valid for ages 23 to 44   Patient is participating in a Managed Medicaid Plan: Yes  A/P: LIBERATE study:  Study completed. Rxn sent to see if sensors are covered by her insurance.   Diabetes longstanding currently controlled. Patient is able to verbalize appropriate hypoglycemia management plan. Medication adherence appears appropriate. CGM report & A1c today (5.9%) shows outstanding glycemic control.  -Increased dose of GLP-1 Mounjaro (tirzepatide) from 7.5 mg to 10 mg weekly.  -Discontinue metformin XR 750 mg BID.  -Call in CGM sensors now that LIBERATE study is done.  -Patient educated on purpose, proper use, and potential adverse effects of Mounjaro.  -Extensively discussed pathophysiology of diabetes, recommended lifestyle interventions, dietary effects on blood sugar control.  -Counseled on s/sx of and management of hypoglycemia.  -Next A1c anticipated 02/2023.   ASCVD risk - primary prevention in patient with diabetes. Last LDL is 139 not at goal of <161 mg/dL. ASCVD risk factors include HTN, HLD, T2DM, obesity, tob use.  moderate intensity statin indicated.  -Continue rosuvastatin 10  mg.   Hypertension longstanding currently controlled. Blood pressure goal of <130/80 mmHg. Medication adherence appropriate, BP slightly elevated today due to stress this morning.  -Continued lisinopril 20 mg. -address NRT therapy at next visit  Written patient instructions  provided. Patient verbalized understanding of treatment plan.  Total time in face to face counseling 30 minutes.    Follow-up:  Pharmacist in 1 month  Patient seen with Rosina Lowenstein, PharmD Candidate  Butch Penny, PharmD, BCACP, CPP Clinical Pharmacist [facility] [phone]

## 2022-11-24 ENCOUNTER — Telehealth: Payer: Self-pay | Admitting: Pharmacist

## 2022-11-24 ENCOUNTER — Encounter: Payer: Self-pay | Admitting: Pharmacist

## 2022-11-24 ENCOUNTER — Ambulatory Visit: Payer: BC Managed Care – PPO | Attending: Family Medicine | Admitting: Pharmacist

## 2022-11-24 ENCOUNTER — Encounter: Payer: Self-pay | Admitting: Family Medicine

## 2022-11-24 ENCOUNTER — Other Ambulatory Visit: Payer: Self-pay

## 2022-11-24 VITALS — BP 139/86 | HR 71 | Wt 327.4 lb

## 2022-11-24 DIAGNOSIS — Z7985 Long-term (current) use of injectable non-insulin antidiabetic drugs: Secondary | ICD-10-CM

## 2022-11-24 DIAGNOSIS — E119 Type 2 diabetes mellitus without complications: Secondary | ICD-10-CM | POA: Diagnosis not present

## 2022-11-24 DIAGNOSIS — Z7984 Long term (current) use of oral hypoglycemic drugs: Secondary | ICD-10-CM

## 2022-11-24 LAB — POCT GLYCOSYLATED HEMOGLOBIN (HGB A1C): HbA1c, POC (controlled diabetic range): 5.9 % (ref 0.0–7.0)

## 2022-11-24 MED ORDER — FREESTYLE LIBRE 3 SENSOR MISC
6 refills | Status: AC
Start: 2022-11-24 — End: ?
  Filled 2022-11-24: qty 2, fill #0
  Filled 2022-11-24: qty 2, 28d supply, fill #0

## 2022-11-24 NOTE — Telephone Encounter (Signed)
Can we attempt a PA for this patient's [place] sensor?

## 2022-12-05 ENCOUNTER — Other Ambulatory Visit: Payer: Self-pay

## 2022-12-08 ENCOUNTER — Ambulatory Visit: Payer: BC Managed Care – PPO | Admitting: Family Medicine

## 2022-12-08 ENCOUNTER — Other Ambulatory Visit: Payer: Self-pay

## 2022-12-08 ENCOUNTER — Encounter: Payer: Self-pay | Admitting: Family Medicine

## 2022-12-08 VITALS — BP 127/83 | HR 68 | Temp 98.1°F | Resp 16 | Wt 323.0 lb

## 2022-12-08 DIAGNOSIS — E785 Hyperlipidemia, unspecified: Secondary | ICD-10-CM

## 2022-12-08 DIAGNOSIS — E119 Type 2 diabetes mellitus without complications: Secondary | ICD-10-CM | POA: Diagnosis not present

## 2022-12-08 DIAGNOSIS — Z7985 Long-term (current) use of injectable non-insulin antidiabetic drugs: Secondary | ICD-10-CM

## 2022-12-08 DIAGNOSIS — Z6841 Body Mass Index (BMI) 40.0 and over, adult: Secondary | ICD-10-CM

## 2022-12-08 DIAGNOSIS — Z720 Tobacco use: Secondary | ICD-10-CM

## 2022-12-08 DIAGNOSIS — F17210 Nicotine dependence, cigarettes, uncomplicated: Secondary | ICD-10-CM

## 2022-12-08 DIAGNOSIS — E66813 Obesity, class 3: Secondary | ICD-10-CM

## 2022-12-08 DIAGNOSIS — H5213 Myopia, bilateral: Secondary | ICD-10-CM | POA: Diagnosis not present

## 2022-12-08 LAB — POCT HEMOGLOBIN: Hemoglobin: 5.9 g/dL — AB (ref 11–14.6)

## 2022-12-08 MED ORDER — VARENICLINE TARTRATE (STARTER) 0.5 MG X 11 & 1 MG X 42 PO TBPK: ORAL_TABLET | ORAL | 0 refills | Status: AC

## 2022-12-08 NOTE — Progress Notes (Signed)
Established Patient Office Visit  Subjective    Patient ID: Kayla Flynn, female    DOB: 14-Mar-1990  Age: 32 y.o. MRN: [id number]  CC:  Chief Complaint  Patient presents with   Medical Management of Chronic Issues    HPI Marylen Zuk presents for follow up of chronic med issues.   Outpatient Encounter Medications as of 12/08/2022  Medication Sig   Accu-Chek FastClix Lancets MISC 1 each by Other route 4 (four) times daily -  before meals and at bedtime.   albuterol (VENTOLIN HFA) 108 (90 Base) MCG/ACT inhaler Inhale 1-2 puffs into the lungs every 6 (six) hours as needed.   benzonatate (TESSALON) 100 MG capsule Take 1 capsule (100 mg total) by mouth 3 (three) times daily as needed.   Blood Glucose Monitoring Suppl (ACCU-CHEK GUIDE ME) w/Device KIT 1 kit by Other route 4 (four) times daily -  before meals and at bedtime.   Continuous Glucose Sensor (FREESTYLE LIBRE 3 SENSOR) MISC Place 1 sensor on the skin every 14 days. Use to check glucose continuously   fluticasone (FLONASE) 50 MCG/ACT nasal spray Place 2 sprays into both nostrils daily.   glucose blood (ACCU-CHEK GUIDE) test strip 1 each by Other route 4 (four) times daily -  before meals and at bedtime.   ibuprofen (ADVIL) 600 MG tablet Take 1 tablet (600 mg total) by mouth every 6 (six) hours as needed for mild pain or moderate pain.   Lancets Misc. (ACCU-CHEK FASTCLIX LANCET) KIT Use to check FSBS twice a day. Dx: E11.65 Z79.4   lisinopril (ZESTRIL) 20 MG tablet Take 1 tablet (20 mg total) by mouth daily.   metFORMIN (GLUCOPHAGE-XR) 750 MG 24 hr tablet Take 1 tablet (750 mg total) by mouth 2 (two) times daily with a meal.   mirtazapine (REMERON) 15 MG tablet Take 1 tablet (15 mg total) by mouth at bedtime.   naproxen (NAPROSYN) 500 MG tablet Take 1 tablet (500 mg total) by mouth 2 (two) times daily with a meal.   nicotine (NICODERM CQ - DOSED IN MG/24 HOURS) 21 mg/24hr patch Place 1 patch (21 mg total) onto the skin daily.    nicotine polacrilex (NICORETTE) 2 MG gum Chew 1 piece of gum every 2 to 4 hours (maximum: 24 pieces/day).   promethazine-dextromethorphan (PROMETHAZINE-DM) 6.25-15 MG/5ML syrup Take 5 mLs by mouth 4 (four) times daily as needed.   rosuvastatin (CRESTOR) 10 MG tablet Take 1 tablet (10 mg total) by mouth daily.   tirzepatide (MOUNJARO) 10 MG/0.5ML Pen Inject 10 mg into the skin once a week. For 4 weeks. Then increase to the 12.5mg  dose.   tirzepatide (MOUNJARO) 12.5 MG/0.5ML Pen Inject 12.5 mg into the skin once a week. For 4 weeks. Then increase to the 15mg  dose.   tirzepatide (MOUNJARO) 15 MG/0.5ML Pen Inject 15 mg into the skin once a week.   tirzepatide (MOUNJARO) 7.5 MG/0.5ML Pen Inject 7.5 mg into the skin once a week. For 4 weeks, then increase to the 10 mg dose.   Varenicline Tartrate, Starter, (CHANTIX STARTING MONTH PAK) 0.5 MG X 11 & 1 MG X 42 TBPK Take by mouth as directed   No facility-administered encounter medications on file as of 12/08/2022.    Past Medical History:  Diagnosis Date   Acute appendicitis 07/18/2012   Hypertension    Obesity    Obesity, morbid, BMI 50 or higher (HCC) 07/18/2012   Severe preeclampsia 12/10/2014   Type 2 diabetes mellitus (HCC)  Past Surgical History:  Procedure Laterality Date   APPENDECTOMY  07/17/2012   CESAREAN SECTION N/A 12/18/2014   Procedure: CESAREAN SECTION;  Surgeon: Tereso Newcomer, MD;  Location: [facility];  Service: Obstetrics;  Laterality: N/A;   LAPAROSCOPIC APPENDECTOMY N/A 07/17/2012   Procedure: APPENDECTOMY LAPAROSCOPIC;  Surgeon: Liz Malady, MD;  Location: [facility] OR;  Service: General;  Laterality: N/A;    Family History  Problem Relation Age of Onset   Hypertension Mother    Diabetes Mother    Hypertension Maternal Grandmother     Social History   Socioeconomic History   Marital status: Single    Spouse name: Not on file   Number of children: Not on file   Years of education: Not on file   Highest education  level: Some college, no degree  Occupational History   Not on file  Tobacco Use   Smoking status: Every Day    Current packs/day: 0.25    Average packs/day: 0.3 packs/day for 1.2 years (0.3 ttl pk-yrs)    Types: Cigarettes    Passive exposure: Current   Smokeless tobacco: Never  Vaping Use   Vaping status: Never Used  Substance and Sexual Activity   Alcohol use: Yes    Comment: socially   Drug use: No   Sexual activity: Yes    Partners: Male    Birth control/protection: None  Other Topics Concern   Not on file  Social History Narrative   Not on file   Social Determinants of Health   Financial Resource Strain: Medium Risk (07/26/2022)   Overall Financial Resource Strain (CARDIA)    Difficulty of Paying Living Expenses: Somewhat hard  Food Insecurity: Food Insecurity Present (07/26/2022)   Hunger Vital Sign    Worried About Running Out of Food in the Last Year: Often true    Ran Out of Food in the Last Year: Sometimes true  Transportation Needs: No Transportation Needs (07/26/2022)   PRAPARE - Administrator, Civil Service (Medical): No    Lack of Transportation (Non-Medical): No  Physical Activity: Inactive (07/26/2022)   Exercise Vital Sign    Days of Exercise per Week: 0 days    Minutes of Exercise per Session: 0 min  Stress: No Stress Concern Present (07/26/2022)   [employer] of Occupational Health - Occupational Stress Questionnaire    Feeling of Stress : Not at all  Social Connections: Socially Isolated (07/26/2022)   Social Connection and Isolation Panel [NHANES]    Frequency of Communication with Friends and Family: Once a week    Frequency of Social Gatherings with Friends and Family: Never    Attends Religious Services: Never    Database administrator or Organizations: No    Attends Engineer, structural: 1 to 4 times per year    Marital Status: Never married  Intimate Partner Violence: Not At Risk (05/30/2022)   Humiliation, Afraid,  Rape, and Kick questionnaire    Fear of Current or Ex-Partner: No    Emotionally Abused: No    Physically Abused: No    Sexually Abused: No    Review of Systems  All other systems reviewed and are negative.       Objective    BP 127/83   Pulse 68   Temp 98.1 F (36.7 C) (Oral)   Resp 16   Wt (!) 323 lb (146.5 kg)   SpO2 96%   BMI 64.15 kg/m   Physical Exam Vitals and nursing  note reviewed.  Constitutional:      General: She is not in acute distress.    Appearance: She is obese.  Cardiovascular:     Rate and Rhythm: Normal rate and regular rhythm.  Pulmonary:     Effort: Pulmonary effort is normal.     Breath sounds: Normal breath sounds.  Abdominal:     Palpations: Abdomen is soft.     Tenderness: There is no abdominal tenderness.  Neurological:     General: No focal deficit present.     Mental Status: She is alert and oriented to person, place, and time.         Assessment & Plan:   Type 2 diabetes mellitus without complication, without long-term current use of insulin (HCC) -     POCT hemoglobin  Long-term current use of injectable noninsulin antidiabetic medication  Hyperlipidemia, unspecified hyperlipidemia type  Class 3 severe obesity due to excess calories with serious comorbidity and body mass index (BMI) of 60.0 to 69.9 in adult [facility])  Tobacco abuse  Other orders -     Varenicline Tartrate (Starter); Take by mouth as directed  Dispense: 1 each; Refill: 0     Return in about 4 weeks (around 01/05/2023).   Tommie Raymond, MD

## 2022-12-20 ENCOUNTER — Other Ambulatory Visit: Payer: Self-pay

## 2022-12-20 ENCOUNTER — Telehealth: Payer: BC Managed Care – PPO | Admitting: Physician Assistant

## 2022-12-20 DIAGNOSIS — A084 Viral intestinal infection, unspecified: Secondary | ICD-10-CM

## 2022-12-20 NOTE — Progress Notes (Addendum)
E-Visit for Diarrhea  We are sorry that you are not feeling well.  Here is how we plan to help!  Based on what you have shared with me it looks like you have Acute Infectious Diarrhea.  Most cases of acute diarrhea are due to infections with virus and bacteria and are self-limited conditions lasting less than 14 days.  For your symptoms you may take Imodium 2 mg tablets that are over the counter at your local pharmacy. Take two tablet now and then one after each loose stool up to 6 a day.  Antibiotics are not needed for most people with diarrhea.   HOME CARE We recommend changing your diet to help with your symptoms for the next few days. Drink plenty of fluids that contain water salt and sugar. Sports drinks such as Gatorade may help.  You may try broths, soups, bananas, applesauce, soft breads, mashed potatoes or crackers.  You are considered infectious for as long as the diarrhea continues. Hand washing or use of alcohol based hand sanitizers is recommend. It is best to stay out of work or school until your symptoms stop.   GET HELP RIGHT AWAY If you have dark yellow colored urine or do not pass urine frequently you should drink more fluids.   If your symptoms worsen  If you feel like you are going to pass out (faint) You have a new problem  MAKE SURE YOU  Understand these instructions. Will watch your condition. Will get help right away if you are not doing well or get worse.  Thank you for choosing an e-visit.  Your e-visit answers were reviewed by a board certified advanced clinical practitioner to complete your personal care plan. Depending upon the condition, your plan could have included both over the counter or prescription medications.  Please review your pharmacy choice. Make sure the pharmacy is open so you can pick up prescription now. If there is a problem, you may contact your provider through [employer] and have the prescription routed to another pharmacy.   Your safety is important to [place]. If you have drug allergies check your prescription carefully.   For the next 24 hours you can use MyChart to ask questions about today's visit, request a non-urgent call back, or ask for a work or school excuse. You will get an email in the next two days asking about your experience. I hope that your e-visit has been valuable and will speed your recovery.

## 2022-12-20 NOTE — Progress Notes (Signed)
I have spent 5 minutes in review of e-visit questionnaire, review and updating patient chart, medical decision making and response to patient.   Mia Milan Cody Jacklynn Dehaas, PA-C    

## 2022-12-20 NOTE — Addendum Note (Signed)
Addended by: Waldon Merl on: 12/20/2022 09:52 AM   Modules accepted: Level of Service

## 2022-12-22 ENCOUNTER — Other Ambulatory Visit: Payer: Self-pay

## 2022-12-23 ENCOUNTER — Telehealth: Payer: BC Managed Care – PPO | Admitting: Family Medicine

## 2022-12-23 DIAGNOSIS — J069 Acute upper respiratory infection, unspecified: Secondary | ICD-10-CM

## 2022-12-23 MED ORDER — BENZONATATE 200 MG PO CAPS: 200.0000 mg | ORAL_CAPSULE | Freq: Two times a day (BID) | ORAL | 0 refills | Status: DC | PRN

## 2022-12-23 MED ORDER — FLUTICASONE PROPIONATE 50 MCG/ACT NA SUSP: 2.0000 | Freq: Every day | NASAL | 0 refills | Status: DC

## 2022-12-23 NOTE — Progress Notes (Signed)
E-Visit for Upper Respiratory Infection   We are sorry you are not feeling well.  Here is how we plan to help!  Based on what you have shared with me, it looks like you may have a viral upper respiratory infection.  Upper respiratory infections are caused by a large number of viruses; however, rhinovirus is the most common cause.   Symptoms vary from person to person, with common symptoms including sore throat, cough, fatigue or lack of energy and feeling of general discomfort.  A low-grade fever of up to 100.4 may present, but is often uncommon.  Symptoms vary however, and are closely related to a person's age or underlying illnesses.  The most common symptoms associated with an upper respiratory infection are nasal discharge or congestion, cough, sneezing, headache and pressure in the ears and face.  These symptoms usually persist for about 3 to 10 days, but can last up to 2 weeks.  It is important to know that upper respiratory infections do not cause serious illness or complications in most cases.    Upper respiratory infections can be transmitted from person to person, with the most common method of transmission being a person's hands.  The virus is able to live on the skin and can infect other persons for up to 2 hours after direct contact.  Also, these can be transmitted when someone coughs or sneezes; thus, it is important to cover the mouth to reduce this risk.  To keep the spread of the illness at bay, good hand hygiene is very important.  This is an infection that is most likely caused by a virus. There are no specific treatments other than to help you with the symptoms until the infection runs its course.  We are sorry you are not feeling well.  Here is how we plan to help!   For nasal congestion, you may use an oral decongestants such as Mucinex D or if you have glaucoma or high blood pressure use plain Mucinex.  Saline nasal spray or nasal drops can help and can safely be used as often as  needed for congestion.  For your congestion, I have prescribed Fluticasone nasal spray one spray in each nostril twice a day  If you do not have a history of heart disease, hypertension, diabetes or thyroid disease, prostate/bladder issues or glaucoma, you may also use Sudafed to treat nasal congestion.  It is highly recommended that you consult with a pharmacist or your primary care physician to ensure this medication is safe for you to take.     If you have a cough, you may use cough suppressants such as Delsym and Robitussin.  If you have glaucoma or high blood pressure, you can also use Coricidin HBP.   For cough I have prescribed for you A prescription cough medication called Tessalon Perles 100 mg. You may take 1-2 capsules every 8 hours as needed for cough  If you have a sore or scratchy throat, use a saltwater gargle-  to  teaspoon of salt dissolved in a 4-ounce to 8-ounce glass of warm water.  Gargle the solution for approximately 15-30 seconds and then spit.  It is important not to swallow the solution.  You can also use throat lozenges/cough drops and Chloraseptic spray to help with throat pain or discomfort.  Warm or cold liquids can also be helpful in relieving throat pain.  For headache, pain or general discomfort, you can use Ibuprofen or Tylenol as directed.   Some authorities believe   that zinc sprays or the use of Echinacea may shorten the course of your symptoms.   HOME CARE Only take medications as instructed by your medical team. Be sure to drink plenty of fluids. Water is fine as well as fruit juices, sodas and electrolyte beverages. You may want to stay away from caffeine or alcohol. If you are nauseated, try taking small sips of liquids. How do you know if you are getting enough fluid? Your urine should be a pale yellow or almost colorless. Get rest. Taking a steamy shower or using a humidifier may help nasal congestion and ease sore throat pain. You can place a towel over  your head and breathe in the steam from hot water coming from a faucet. Using a saline nasal spray works much the same way. Cough drops, hard candies and sore throat lozenges may ease your cough. Avoid close contacts especially the very young and the elderly Cover your mouth if you cough or sneeze Always remember to wash your hands.   GET HELP RIGHT AWAY IF: You develop worsening fever. If your symptoms do not improve within 10 days You develop yellow or green discharge from your nose over 3 days. You have coughing fits You develop a severe head ache or visual changes. You develop shortness of breath, difficulty breathing or start having chest pain Your symptoms persist after you have completed your treatment plan  MAKE SURE YOU  Understand these instructions. Will watch your condition. Will get help right away if you are not doing well or get worse.  Thank you for choosing an e-visit.  Your e-visit answers were reviewed by a board certified advanced clinical practitioner to complete your personal care plan. Depending upon the condition, your plan could have included both over the counter or prescription medications.  Please review your pharmacy choice. Make sure the pharmacy is open so you can pick up prescription now. If there is a problem, you may contact your provider through MyChart messaging and have the prescription routed to another pharmacy.  Your safety is important to us. If you have drug allergies check your prescription carefully.   For the next 24 hours you can use MyChart to ask questions about today's visit, request a non-urgent call back, or ask for a work or school excuse. You will get an email in the next two days asking about your experience. I hope that your e-visit has been valuable and will speed your recovery.  I have provided 5 minutes of non face to face time during this encounter for chart review and documentation.     

## 2022-12-27 ENCOUNTER — Other Ambulatory Visit: Payer: Self-pay

## 2022-12-29 ENCOUNTER — Ambulatory Visit: Payer: BC Managed Care – PPO | Admitting: Pharmacist

## 2022-12-30 ENCOUNTER — Telehealth: Payer: BC Managed Care – PPO | Admitting: Physician Assistant

## 2022-12-30 DIAGNOSIS — R110 Nausea: Secondary | ICD-10-CM

## 2022-12-30 MED ORDER — ONDANSETRON 4 MG PO TBDP: 4.0000 mg | ORAL_TABLET | Freq: Three times a day (TID) | ORAL | 0 refills | Status: AC | PRN

## 2022-12-30 NOTE — Addendum Note (Signed)
Addended by: Margaretann Loveless on: 12/30/2022 04:32 PM   Modules accepted: Orders, Level of Service

## 2022-12-30 NOTE — Progress Notes (Signed)

## 2022-12-30 NOTE — Progress Notes (Signed)
Because you are having continued nausea over 10 days (had an E-Visit for same issue on 12/20/22) with personal history of diabetes , I feel your condition warrants further evaluation and I recommend that you be seen in a face to face visit. Nausea in diabetics can sometimes be caused by gastroparesis, which is a paralysis of the gut. This should be evaluated in person.    NOTE: There will be NO CHARGE for this eVisit   If you are having a true medical emergency please call 911.      For an urgent face to face visit, [place] has eight urgent care centers for your convenience:   NEW!! [facility] Center at [facility] Get Driving Directions [phone] [address], [address] [place], [address]    [facility] Health Urgent Care Center at [facility] Get Driving Directions [phone] [address] [place], [place] [address]   [facility] Health Urgent Care Center [facility]) Get Driving Directions [phone] [address] [place], [place] [address]  [facility] Health Urgent Care Center [facility]) Get Driving Directions [phone] [address] [place],  [place]  [address]  [facility] Health Urgent Care Center [facility] - at [employer]  [phone] [address] [place],  [place] [address]   [facility] Health Urgent Care at [facility] Get Driving Directions [phone] [address] [place], [place] [address]   [facility] Health Urgent Care at [facility] Get Driving Directions  [phone] [address] [place], [place] [address]   [facility] Health Urgent Care at [facility] Directions [phone] [address], [address] [place], [place] [address]  Your MyChart E-visit questionnaire answers were reviewed by a board certified advanced clinical practitioner to complete your personal care plan based on your specific symptoms.  Thank you  for using e-Visits.    I have spent 5 minutes in review of e-visit questionnaire, review and updating patient chart, medical decision making and response to patient.   Margaretann Loveless, PA-C

## 2023-01-05 ENCOUNTER — Telehealth: Payer: Self-pay | Admitting: Family Medicine

## 2023-01-05 ENCOUNTER — Telehealth: Payer: BC Managed Care – PPO | Admitting: Family Medicine

## 2023-01-05 ENCOUNTER — Encounter: Payer: Self-pay | Admitting: Family Medicine

## 2023-01-05 DIAGNOSIS — Z716 Tobacco abuse counseling: Secondary | ICD-10-CM

## 2023-01-05 DIAGNOSIS — F3289 Other specified depressive episodes: Secondary | ICD-10-CM | POA: Diagnosis not present

## 2023-01-05 NOTE — Progress Notes (Signed)
Virtual Visit via Video Note  I connected with Thurnell Garbe on 01/05/23 at  9:20 AM EDT by a video enabled telemedicine application and verified that I am speaking with the correct person using two identifiers.  Location: Patient: [place] Provider: [place]   I discussed the limitations of evaluation and management by telemedicine and the availability of in person appointments. The patient expressed understanding and agreed to proceed.  History of Present Illness: Patient reports that since she has started chantix for smoking cessation, she has been having depression sx with are increasing. She has continued to take the chantix. Sx are affecting work and relationships.    Observations/Objective:   Assessment and Plan: 1. Encounter for smoking cessation counseling Patient to stop chantix.   2. Other depression Will monitor off of chantix. Consider starting antidepressant if mood does not improve. Consider Wellbutrin (plan possible start 02/06/2023). Patient off work until 01/16/2023. Patient to send in [facility] paperwork on today for completion.    Follow Up Instructions:    I discussed the assessment and treatment plan with the patient. The patient was provided an opportunity to ask questions and all were answered. The patient agreed with the plan and demonstrated an understanding of the instructions.   The patient was advised to call back or seek an in-person evaluation if the symptoms worsen or if the condition fails to improve as anticipated.  I provided 7 minutes of non-face-to-face time during this encounter.   Tommie Raymond, MD

## 2023-01-05 NOTE — Telephone Encounter (Signed)
Form has been faxed to [employer].

## 2023-01-05 NOTE — Telephone Encounter (Signed)
Patient dropped off document  FMLA forms , to be filled out by provider. Patient requested to send it back via Fax within 5-days. Document is located in providers tray at front office.Please fax at  [phone]

## 2023-01-16 ENCOUNTER — Encounter: Payer: Self-pay | Admitting: Family Medicine

## 2023-01-16 NOTE — Telephone Encounter (Signed)
Please advise the patient.

## 2023-01-20 ENCOUNTER — Other Ambulatory Visit: Payer: Self-pay

## 2023-02-06 ENCOUNTER — Ambulatory Visit: Payer: BC Managed Care – PPO | Admitting: Family Medicine

## 2023-02-06 ENCOUNTER — Encounter: Payer: Self-pay | Admitting: Family Medicine

## 2023-02-06 VITALS — BP 134/82 | HR 76 | Temp 98.1°F | Resp 16 | Wt 319.0 lb

## 2023-02-06 DIAGNOSIS — F32A Depression, unspecified: Secondary | ICD-10-CM

## 2023-02-06 DIAGNOSIS — F419 Anxiety disorder, unspecified: Secondary | ICD-10-CM

## 2023-02-06 DIAGNOSIS — Z716 Tobacco abuse counseling: Secondary | ICD-10-CM

## 2023-02-06 MED ORDER — BUPROPION HCL ER (XL) 150 MG PO TB24: 150.0000 mg | ORAL_TABLET | Freq: Every day | ORAL | 1 refills | Status: DC

## 2023-02-06 NOTE — Progress Notes (Signed)
Established Patient Office Visit  Subjective    Patient ID: Kayla Flynn, female    DOB: 12/14/1990  Age: 32 y.o. MRN: [id number]  CC:  Chief Complaint  Patient presents with   Medical Management of Chronic Issues    HPI Kayla Flynn presents follow up of anxiety/depression. Patient reports that she requested extension of leave until today and that she finds that she is doing much better. Patient is willing to start medicine as well to improve her symptoms.   Outpatient Encounter Medications as of 02/06/2023  Medication Sig   Accu-Chek FastClix Lancets MISC 1 each by Other route 4 (four) times daily -  before meals and at bedtime.   albuterol (VENTOLIN HFA) 108 (90 Base) MCG/ACT inhaler Inhale 1-2 puffs into the lungs every 6 (six) hours as needed.   benzonatate (TESSALON) 100 MG capsule Take 1 capsule (100 mg total) by mouth 3 (three) times daily as needed.   benzonatate (TESSALON) 200 MG capsule Take 1 capsule (200 mg total) by mouth 2 (two) times daily as needed for cough.   Blood Glucose Monitoring Suppl (ACCU-CHEK GUIDE ME) w/Device KIT 1 kit by Other route 4 (four) times daily -  before meals and at bedtime.   buPROPion (WELLBUTRIN XL) 150 MG 24 hr tablet Take 1 tablet (150 mg total) by mouth daily.   Continuous Glucose Sensor (FREESTYLE LIBRE 3 SENSOR) MISC Place 1 sensor on the skin every 14 days. Use to check glucose continuously   fluticasone (FLONASE) 50 MCG/ACT nasal spray Place 2 sprays into both nostrils daily.   glucose blood (ACCU-CHEK GUIDE) test strip 1 each by Other route 4 (four) times daily -  before meals and at bedtime.   ibuprofen (ADVIL) 600 MG tablet Take 1 tablet (600 mg total) by mouth every 6 (six) hours as needed for mild pain or moderate pain.   Lancets Misc. (ACCU-CHEK FASTCLIX LANCET) KIT Use to check FSBS twice a day. Dx: E11.65 Z79.4   lisinopril (ZESTRIL) 20 MG tablet Take 1 tablet (20 mg total) by mouth daily.   metFORMIN (GLUCOPHAGE-XR) 750 MG 24 hr  tablet Take 1 tablet (750 mg total) by mouth 2 (two) times daily with a meal.   mirtazapine (REMERON) 15 MG tablet Take 1 tablet (15 mg total) by mouth at bedtime.   naproxen (NAPROSYN) 500 MG tablet Take 1 tablet (500 mg total) by mouth 2 (two) times daily with a meal.   nicotine (NICODERM CQ - DOSED IN MG/24 HOURS) 21 mg/24hr patch Place 1 patch (21 mg total) onto the skin daily.   nicotine polacrilex (NICORETTE) 2 MG gum Chew 1 piece of gum every 2 to 4 hours (maximum: 24 pieces/day).   ondansetron (ZOFRAN-ODT) 4 MG disintegrating tablet Take 1 tablet (4 mg total) by mouth every 8 (eight) hours as needed.   promethazine-dextromethorphan (PROMETHAZINE-DM) 6.25-15 MG/5ML syrup Take 5 mLs by mouth 4 (four) times daily as needed.   rosuvastatin (CRESTOR) 10 MG tablet Take 1 tablet (10 mg total) by mouth daily.   tirzepatide (MOUNJARO) 10 MG/0.5ML Pen Inject 10 mg into the skin once a week. For 4 weeks. Then increase to the 12.5mg  dose.   tirzepatide (MOUNJARO) 12.5 MG/0.5ML Pen Inject 12.5 mg into the skin once a week. For 4 weeks. Then increase to the 15mg  dose.   tirzepatide (MOUNJARO) 15 MG/0.5ML Pen Inject 15 mg into the skin once a week.   tirzepatide (MOUNJARO) 7.5 MG/0.5ML Pen Inject 7.5 mg into the skin once a week.  For 4 weeks, then increase to the 10 mg dose.   Varenicline Tartrate, Starter, (CHANTIX STARTING MONTH PAK) 0.5 MG X 11 & 1 MG X 42 TBPK Take by mouth as directed   No facility-administered encounter medications on file as of 02/06/2023.    Past Medical History:  Diagnosis Date   Acute appendicitis 07/18/2012   Hypertension    Obesity    Obesity, morbid, BMI 50 or higher (HCC) 07/18/2012   Severe preeclampsia 12/10/2014   Type 2 diabetes mellitus [facility])     Past Surgical History:  Procedure Laterality Date   APPENDECTOMY  07/17/2012   CESAREAN SECTION N/A 12/18/2014   Procedure: CESAREAN SECTION;  Surgeon: Tereso Newcomer, MD;  Location: [facility] ORS;  Service: Obstetrics;   Laterality: N/A;   LAPAROSCOPIC APPENDECTOMY N/A 07/17/2012   Procedure: APPENDECTOMY LAPAROSCOPIC;  Surgeon: Liz Malady, MD;  Location: [facility] OR;  Service: General;  Laterality: N/A;    Family History  Problem Relation Age of Onset   Hypertension Mother    Diabetes Mother    Hypertension Maternal Grandmother     Social History   Socioeconomic History   Marital status: Single    Spouse name: Not on file   Number of children: Not on file   Years of education: Not on file   Highest education level: Some college, no degree  Occupational History   Not on file  Tobacco Use   Smoking status: Every Day    Current packs/day: 0.25    Average packs/day: 0.3 packs/day for 1.2 years (0.3 ttl pk-yrs)    Types: Cigarettes    Passive exposure: Current   Smokeless tobacco: Never  Vaping Use   Vaping status: Never Used  Substance and Sexual Activity   Alcohol use: Yes    Comment: socially   Drug use: No   Sexual activity: Yes    Partners: Male    Birth control/protection: None  Other Topics Concern   Not on file  Social History Narrative   Not on file   Social Determinants of Health   Financial Resource Strain: Medium Risk (07/26/2022)   Overall Financial Resource Strain (CARDIA)    Difficulty of Paying Living Expenses: Somewhat hard  Food Insecurity: Food Insecurity Present (07/26/2022)   Hunger Vital Sign    Worried About Running Out of Food in the Last Year: Often true    Ran Out of Food in the Last Year: Sometimes true  Transportation Needs: No Transportation Needs (07/26/2022)   PRAPARE - Administrator, Civil Service (Medical): No    Lack of Transportation (Non-Medical): No  Physical Activity: Inactive (07/26/2022)   Exercise Vital Sign    Days of Exercise per Week: 0 days    Minutes of Exercise per Session: 0 min  Stress: No Stress Concern Present (07/26/2022)   [employer] of Occupational Health - Occupational Stress Questionnaire    Feeling of  Stress : Not at all  Social Connections: Socially Isolated (07/26/2022)   Social Connection and Isolation Panel [NHANES]    Frequency of Communication with Friends and Family: Once a week    Frequency of Social Gatherings with Friends and Family: Never    Attends Religious Services: Never    Database administrator or Organizations: No    Attends Banker Meetings: 1 to 4 times per year    Marital Status: Never married  Intimate Partner Violence: Not At Risk (05/30/2022)   Humiliation, Afraid, Rape, and Kick questionnaire  Fear of Current or Ex-Partner: No    Emotionally Abused: No    Physically Abused: No    Sexually Abused: No    Review of Systems  All other systems reviewed and are negative.       Objective    BP 134/82   Pulse 76   Temp 98.1 F (36.7 C) (Oral)   Resp 16   Wt (!) 319 lb (144.7 kg)   SpO2 98%   BMI 63.35 kg/m   Physical Exam Vitals and nursing note reviewed.  Constitutional:      General: She is not in acute distress.    Appearance: She is obese.  Cardiovascular:     Rate and Rhythm: Normal rate and regular rhythm.  Pulmonary:     Effort: Pulmonary effort is normal.     Breath sounds: Normal breath sounds.  Abdominal:     Palpations: Abdomen is soft.     Tenderness: There is no abdominal tenderness.  Neurological:     General: No focal deficit present.     Mental Status: She is alert and oriented to person, place, and time.  Psychiatric:        Mood and Affect: Mood normal.        Behavior: Behavior normal.         Assessment & Plan:   Anxiety and depression  Encounter for smoking cessation counseling  Other orders -     buPROPion HCl ER (XL); Take 1 tablet (150 mg total) by mouth daily.  Dispense: 30 tablet; Refill: 1     Return in about 4 weeks (around 03/06/2023) for follow up.   Tommie Raymond, MD

## 2023-02-06 NOTE — Progress Notes (Signed)
Patient would like paperwork extension and talk about her mental health.

## 2023-02-08 ENCOUNTER — Telehealth: Payer: BC Managed Care – PPO | Admitting: Physician Assistant

## 2023-02-08 DIAGNOSIS — K047 Periapical abscess without sinus: Secondary | ICD-10-CM | POA: Diagnosis not present

## 2023-02-08 MED ORDER — NAPROXEN 500 MG PO TABS: 500.0000 mg | ORAL_TABLET | Freq: Two times a day (BID) | ORAL | 0 refills | Status: AC

## 2023-02-08 MED ORDER — AMOXICILLIN-POT CLAVULANATE 875-125 MG PO TABS: 1.0000 | ORAL_TABLET | Freq: Two times a day (BID) | ORAL | 0 refills | Status: DC

## 2023-02-08 NOTE — Progress Notes (Signed)

## 2023-02-08 NOTE — Progress Notes (Signed)
I have spent 5 minutes in review of e-visit questionnaire, review and updating patient chart, medical decision making and response to patient.   Mia Milan Cody Jacklynn Dehaas, PA-C    

## 2023-02-15 ENCOUNTER — Telehealth: Payer: Self-pay | Admitting: Family Medicine

## 2023-02-15 NOTE — Telephone Encounter (Signed)
Patient called stated she needs her LOA paperwork sent back with the correct return to work dates on section 3 and 6 the date needs to be 12/0/24. She stated [employer] says the provider just needs to mark thru the previous date and sign/date where she made the correction. Please f/u with patient

## 2023-02-20 ENCOUNTER — Ambulatory Visit: Payer: BC Managed Care – PPO | Admitting: Pharmacist

## 2023-02-21 ENCOUNTER — Ambulatory Visit: Payer: BC Managed Care – PPO | Admitting: Pharmacist

## 2023-02-27 ENCOUNTER — Other Ambulatory Visit: Payer: Self-pay

## 2023-02-28 ENCOUNTER — Other Ambulatory Visit: Payer: Self-pay

## 2023-03-09 ENCOUNTER — Other Ambulatory Visit: Payer: Self-pay | Admitting: Family Medicine

## 2023-03-15 ENCOUNTER — Ambulatory Visit: Payer: BC Managed Care – PPO | Admitting: Family Medicine

## 2023-03-16 ENCOUNTER — Encounter: Payer: Self-pay | Admitting: Family Medicine

## 2023-04-06 ENCOUNTER — Other Ambulatory Visit: Payer: Self-pay

## 2023-04-07 ENCOUNTER — Other Ambulatory Visit: Payer: Self-pay

## 2023-04-10 ENCOUNTER — Other Ambulatory Visit: Payer: Self-pay

## 2023-04-11 ENCOUNTER — Telehealth: Payer: BC Managed Care – PPO | Admitting: Family Medicine

## 2023-04-11 DIAGNOSIS — N921 Excessive and frequent menstruation with irregular cycle: Secondary | ICD-10-CM

## 2023-04-11 NOTE — Progress Notes (Signed)
  Because you are having heavy bleeding, fevers, and pain your condition warrants further evaluation and I recommend that you be seen in a face-to-face visit.   NOTE: There will be NO CHARGE for this E-Visit   If you are having a true medical emergency, please call 911.     For an urgent face to face visit, [place] has multiple urgent care centers for your convenience.  Click the link below for the full list of locations and hours, walk-in wait times, appointment scheduling options and driving directions:  Urgent Care - [place], [place], [place], [place], [place], [place]  [place]     Your MyChart E-visit questionnaire answers were reviewed by a board certified advanced clinical practitioner to complete your personal care plan based on your specific symptoms.    Thank you for using e-Visits.

## 2023-04-12 ENCOUNTER — Ambulatory Visit: Payer: Medicaid Other

## 2023-04-14 ENCOUNTER — Other Ambulatory Visit: Payer: Self-pay

## 2023-04-17 ENCOUNTER — Other Ambulatory Visit: Payer: Self-pay

## 2023-04-24 ENCOUNTER — Other Ambulatory Visit: Payer: Self-pay

## 2023-04-27 ENCOUNTER — Other Ambulatory Visit: Payer: Self-pay

## 2023-04-28 ENCOUNTER — Other Ambulatory Visit: Payer: Self-pay

## 2023-06-16 ENCOUNTER — Telehealth: Admitting: Physician Assistant

## 2023-06-16 DIAGNOSIS — J069 Acute upper respiratory infection, unspecified: Secondary | ICD-10-CM

## 2023-06-16 MED ORDER — FLUTICASONE PROPIONATE 50 MCG/ACT NA SUSP: 2.0000 | Freq: Every day | NASAL | 0 refills | Status: DC

## 2023-06-16 MED ORDER — BENZONATATE 100 MG PO CAPS: 100.0000 mg | ORAL_CAPSULE | Freq: Three times a day (TID) | ORAL | 0 refills | Status: DC | PRN

## 2023-06-16 NOTE — Progress Notes (Signed)

## 2023-10-11 ENCOUNTER — Encounter: Payer: Self-pay | Admitting: Occupational Therapy

## 2023-10-11 ENCOUNTER — Ambulatory Visit: Attending: Nurse Practitioner | Admitting: Occupational Therapy

## 2023-10-11 DIAGNOSIS — R208 Other disturbances of skin sensation: Secondary | ICD-10-CM | POA: Insufficient documentation

## 2023-10-11 NOTE — Therapy (Signed)
 OUTPATIENT OCCUPATIONAL THERAPY NEURO EVALUATION  Patient Name: Kayla Flynn MRN: [id number] DOB:[birth year], 33 y.o., female Today's Date: 10/11/2023  PCP: Celestia Harder, NP  REFERRING PROVIDER: Celestia Harder, NP  END OF SESSION:  OT End of Session - 10/11/23 0927     Visit Number 1    Number of Visits 4    Date for OT Re-Evaluation 11/11/23    Authorization Type [employer] - awaiting auth    OT Start Time 0845    OT Stop Time 0925    OT Time Calculation (min) 40 min    Activity Tolerance Patient tolerated treatment well    Behavior During Therapy [facility] for tasks assessed/performed          Past Medical History:  Diagnosis Date   Acute appendicitis 07/18/2012   Hypertension    Obesity    Obesity, morbid, BMI 50 or higher (HCC) 07/18/2012   Severe preeclampsia 12/10/2014   Type 2 diabetes mellitus (HCC)    Past Surgical History:  Procedure Laterality Date   APPENDECTOMY  07/17/2012   CESAREAN SECTION N/A 12/18/2014   Procedure: CESAREAN SECTION;  Surgeon: Gloris DELENA Hugger, MD;  Location: [facility] ORS;  Service: Obstetrics;  Laterality: N/A;   LAPAROSCOPIC APPENDECTOMY N/A 07/17/2012   Procedure: APPENDECTOMY LAPAROSCOPIC;  Surgeon: Dann FORBES Hummer, MD;  Location: [facility];  Service: General;  Laterality: N/A;   Patient Active Problem List   Diagnosis Date Noted   Moderate episode of recurrent major depressive disorder (HCC) 11/12/2021   Essential hypertension 11/12/2021   Morbid obesity (HCC) 11/12/2021   Tobacco abuse 11/12/2021   Hyperlipidemia 12/05/2019   Benign essential HTN 01/28/2015   History of syphilis 08/11/2014   Maternal varicella, non-immune 07/21/2014   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 All fingertips, Rt hand worse than Lt hand and gets better when I massage arms. Might be do the my diabetes Pt accompanied by: self  PERTINENT HISTORY: DM, HTN, Obesity  PRECAUTIONS: None  WEIGHT BEARING RESTRICTIONS: No  PAIN:  Are you having pain? No and just numbness/tingling fingertips sporadically  FALLS: Has patient fallen in last 6 months? No  LIVING ENVIRONMENT: Lives with: lives with their family Lives in: townhome - level entry with bedroom/full bath upstairs Has following equipment at home: None  PLOF: Independent and Leisure: nails, arts/crafts, DIY  PATIENT GOALS: work on posture and decrease hand symptoms  OBJECTIVE:  Note: Objective measures were completed at Evaluation unless otherwise noted.  HAND DOMINANCE: Right  ADLs: Overall ADLs: independent Transfers/ambulation related to ADLs: independent  IADLs: independent Handwriting: denies change  MOBILITY STATUS: Independent  POSTURE COMMENTS:  No Significant postural limitations  ACTIVITY TOLERANCE: Activity tolerance: WFL's  FUNCTIONAL OUTCOME MEASURES: Quick Dash: 4.5% deficit  UPPER EXTREMITY ROM:  BUE AROM WNL's   UPPER EXTREMITY MMT:   BUE MMT grossly 5/5 at shoulders  HAND FUNCTION: Grip strength: Right: 59.7, 88.4, 62.6 lbs (avg = 70.23 lbs) Left: 63.4, 77.6, 57.5 lbs (avg = 66.17 lbs)   COORDINATION: 9  Hole Peg test: Right: 34 sec; Left: 38 sec (slower bilaterally d/t long nails)   SENSATION: Light touch: WFL Hot/Cold: WFL Pt just reports sporadic numbness/tingling  EDEMA: none   COGNITION: Overall cognitive status: Within functional limits for tasks assessed  VISION: Subjective report: reports  fine Baseline vision: Wears glasses all the time   PERCEPTION: Not tested  PRAXIS: Not tested  OBSERVATIONS: Pt reports numbness comes when sometimes when doing nails, braiding hair, and worse first thing in the morning (but sporadic, not constant). Denies neck injuries but does report occasional popping when rolling head back and to Lt side                                                                                                                             TREATMENT DATE: 10/11/23    Discussed positioning, take frequent rest breaks and stretch when in one position too long, supporting arms when sleeping, and padding nail area to avoid compression of nerves  Also recommended keeping DM under control, exercising, eating healthy, and drinking plenty of water   No charge for treatment/education today d/t payor source     PATIENT EDUCATION: Education details: see above Person educated: Patient Education method: Explanation Education comprehension: verbalized understanding  HOME EXERCISE PROGRAM: N/A   GOALS: Goals reviewed with patient? Yes  LONG TERM GOALS: Target date: 11/11/23  Pt to verbalize understanding with stretches and exercises to help with overall posture Baseline: not yet addressed Goal status: INITIAL  2.  Pt to verbalize understanding of proper body mechanics and activity modifications prn to reduce paresthesia symptoms Baseline: not yet addressed  Goal status: INITIAL    ASSESSMENT:  CLINICAL IMPRESSION: Patient is a 33 y.o. female who was seen today for occupational therapy evaluation for occasional paresthesias of bilateral hands at fingertips. Pt also wishes to work on posture. PMH includes: DM, HTN. Denies neck injuries. Pt would benefit from short duration of O.T. to address symptoms, educate in positioning and activity modifications prn, and develop posture HEP  PERFORMANCE DEFICITS: in functional skills including sensation, body mechanics,  and UE functional use.   IMPAIRMENTS: are limiting patient from rest and sleep and leisure.   CO-MORBIDITIES: may have co-morbidities  that affects occupational performance. Patient will benefit from skilled OT to address above impairments and improve overall function.  MODIFICATION OR ASSISTANCE TO COMPLETE EVALUATION: No modification of tasks or assist necessary to complete an evaluation.  OT OCCUPATIONAL PROFILE AND HISTORY: Problem focused assessment: Including review of records relating to presenting problem.  CLINICAL DECISION MAKING: Moderate - several treatment options, min-mod task modification necessary  REHAB POTENTIAL: Good  EVALUATION COMPLEXITY: Low    PLAN:  OT FREQUENCY: 1x/week  OT DURATION: 4 weeks (anticipate only 2 visits needed)   PLANNED INTERVENTIONS: 97110 therapeutic exercise, 97530 therapeutic activity, 97112 neuromuscular re-education, 97140 manual therapy, 97039 fluidotherapy, 97010 moist heat, patient/family education, and DME and/or AE instructions  RECOMMENDED OTHER SERVICES: none  CONSULTED AND AGREED WITH PLAN OF CARE: Patient  PLAN FOR NEXT SESSION: issue HEP for posture (stretch supine with towel, A/P pelvic tilt w/ scapula retraction, rows and sh ext with theraband, neck stretches)  For all possible CPT codes, reference the Planned Interventions line above.     Check all conditions that are expected to impact treatment: {Conditions expected to impact treatment:Diabetes mellitus   If treatment provided at initial evaluation, no treatment charged due to lack of authorization.        Burnard JINNY Roads, OT 10/11/2023, 9:28 AM

## 2023-10-26 ENCOUNTER — Encounter: Admitting: Occupational Therapy

## 2023-10-31 ENCOUNTER — Encounter: Payer: Self-pay | Admitting: Occupational Therapy

## 2023-10-31 ENCOUNTER — Ambulatory Visit: Admitting: Occupational Therapy

## 2023-10-31 DIAGNOSIS — R208 Other disturbances of skin sensation: Secondary | ICD-10-CM

## 2023-10-31 NOTE — Therapy (Signed)
 OUTPATIENT OCCUPATIONAL THERAPY NEURO TREATMENT  Patient Name: Kayla Flynn MRN: [id number] DOB:1990-12-02, 33 y.o., female Today's Date: 10/31/2023  PCP: Celestia Harder, NP  REFERRING PROVIDER: Celestia Harder, NP  END OF SESSION:  OT End of Session - 10/31/23 1021     Visit Number 2    Number of Visits 4    Date for OT Re-Evaluation 11/11/23    Authorization Type [employer] - awaiting auth    OT Start Time 1020    OT Stop Time 1100    OT Time Calculation (min) 40 min    Activity Tolerance Patient tolerated treatment well    Behavior During Therapy [facility] for tasks assessed/performed          Past Medical History:  Diagnosis Date   Acute appendicitis 07/18/2012   Hypertension    Obesity    Obesity, morbid, BMI 50 or higher (HCC) 07/18/2012   Severe preeclampsia 12/10/2014   Type 2 diabetes mellitus (HCC)    Past Surgical History:  Procedure Laterality Date   APPENDECTOMY  07/17/2012   CESAREAN SECTION N/A 12/18/2014   Procedure: CESAREAN SECTION;  Surgeon: Gloris DELENA Hugger, MD;  Location: [facility] OR[facility];  Service: Obstetrics;  Laterality: N/A;   LAPAROSCOPIC APPENDECTOMY N/A 07/17/2012   Procedure: APPENDECTOMY LAPAROSCOPIC;  Surgeon: Dann FORBES Hummer, MD;  Location: [facility] OR;  Service: General;  Laterality: N/A;   Patient Active Problem List   Diagnosis Date Noted   Moderate episode of recurrent major depressive disorder (HCC) 11/12/2021   Essential hypertension 11/12/2021   Morbid obesity (HCC) 11/12/2021   Tobacco abuse 11/12/2021   Hyperlipidemia 12/05/2019   Benign essential HTN 01/28/2015   History of syphilis 08/11/2014   Maternal varicella, non-immune 07/21/2014   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 Pt overall doing well Pt accompanied by: self  PERTINENT HISTORY: DM, HTN, Obesity  PRECAUTIONS: None  WEIGHT BEARING RESTRICTIONS: No  PAIN:  Are you having pain? No and just numbness/tingling fingertips sporadically  FALLS: Has patient fallen in last 6 months? No  LIVING ENVIRONMENT: Lives with: lives with their family Lives in: townhome - level entry with bedroom/full bath upstairs Has following equipment at home: None  PLOF: Independent and Leisure: nails, arts/crafts, DIY  PATIENT GOALS: work on posture and decrease hand symptoms  OBJECTIVE:  Note: Objective measures were completed at Evaluation unless otherwise noted.  HAND DOMINANCE: Right  ADLs: Overall ADLs: independent Transfers/ambulation related to ADLs: independent  IADLs: independent Handwriting: denies change  MOBILITY STATUS: Independent  POSTURE COMMENTS:  No Significant postural limitations  ACTIVITY TOLERANCE: Activity tolerance: WFL's  FUNCTIONAL OUTCOME MEASURES: Quick Dash: 4.5% deficit  UPPER EXTREMITY ROM:  BUE AROM WNL's   UPPER EXTREMITY MMT:   BUE MMT grossly 5/5 at shoulders  HAND FUNCTION: Grip strength: Right: 59.7, 88.4, 62.6 lbs (avg = 70.23 lbs) Left: 63.4, 77.6, 57.5 lbs (avg = 66.17 lbs)   COORDINATION: 9 Hole Peg test: Right: 34 sec; Left: 38 sec (slower bilaterally d/t long nails)   SENSATION: Light touch: WFL Hot/Cold: [facility]  Pt just reports sporadic numbness/tingling  EDEMA: none   COGNITION: Overall cognitive status: Within functional limits for tasks assessed  VISION: Subjective report: reports fine Baseline vision: Wears glasses all the time   PERCEPTION: Not tested  PRAXIS: Not tested  OBSERVATIONS: Pt  reports numbness comes when sometimes when doing nails, braiding hair, and worse first thing in the morning (but sporadic, not constant). Denies neck injuries but does report occasional popping when rolling head back and to Lt side                                                                                                                             TREATMENT DATE: 10/31/23    Reviewed positioning, take frequent rest breaks and stretch when in one position too long, supporting arms when sleeping, and padding nail area to avoid compression of nerves  Discussed overall posture and health (keeping diabetes under control and monitoring blood sugar regularly, etc)  Issued HEP for posture, neck stretches, and strengthening posterior sh girdle to improve/enhance posture and extensor musculature - see pt instructions for details. Pt performed each as instructed with min cueing. Pt also shown towel stretch in supine for trunk extension and pects stretch - pt loved this!   Pt also shown ways to modify push ups and how to gradually challenge herself as able w/o compensations. Pt shown how to correctly perform push ups at wall, then to gradually go to push ups over table. Pt return demo of each      PATIENT EDUCATION: Education details: see above Person educated: Patient and boyfriend Education method: Explanation, Demonstration, Tactile cues, Verbal cues, and Handouts Education comprehension: verbalized understanding, returned demonstration, and verbal cues required  HOME EXERCISE PROGRAM: 10/31/23: HEP for posture, strengthening   GOALS: Goals reviewed with patient? Yes  LONG TERM GOALS: Target date: 11/11/23  Pt to verbalize understanding with stretches and exercises to help with overall posture Baseline: not yet addressed Goal status: IN PROGRESS  2.  Pt to verbalize understanding of proper body mechanics and activity modifications prn to reduce paresthesia symptoms Baseline: not yet  addressed  Goal status: IN PROGRESS    ASSESSMENT:  CLINICAL IMPRESSION: Pt progressing towards goals this visit. Pt demo increased understanding of importance of positioning and exercises to help minimize symptoms.   PERFORMANCE DEFICITS: in functional skills including sensation, body mechanics, and UE functional use.   IMPAIRMENTS: are limiting patient from rest and sleep and leisure.   CO-MORBIDITIES: may have co-morbidities  that affects occupational performance. Patient will benefit from skilled OT to address above impairments and improve overall function.  MODIFICATION OR ASSISTANCE TO COMPLETE EVALUATION: No modification of tasks or assist necessary to complete an evaluation.  OT OCCUPATIONAL PROFILE AND HISTORY: Problem focused assessment: Including review of records relating to presenting problem.  CLINICAL DECISION MAKING: Moderate - several treatment options, min-mod task modification necessary  REHAB POTENTIAL: Good  EVALUATION COMPLEXITY: Low    PLAN:  OT FREQUENCY: 1x/week  OT DURATION: 4 weeks (anticipate only 2 visits needed)   PLANNED INTERVENTIONS: 97110 therapeutic exercise, 97530 therapeutic activity, 97112 neuromuscular re-education, 97140 manual therapy, 97039 fluidotherapy, 97010 moist heat, patient/family education, and DME and/or AE instructions  RECOMMENDED OTHER SERVICES: none  CONSULTED AND AGREED WITH PLAN OF CARE: Patient  PLAN FOR NEXT SESSION: review HEP, issue tendon gliding ex's for hands, and D/C next session  For all possible CPT codes, reference the Planned Interventions line above.     Check all conditions that are expected to impact treatment: {Conditions expected to impact treatment:Diabetes mellitus   If treatment provided at initial evaluation, no treatment charged due to lack of authorization.        Burnard JINNY Roads, OT 10/31/2023, 10:22 AM

## 2023-10-31 NOTE — Patient Instructions (Signed)
 PELVIC TILT: Anterior    Start in slumped position. Roll pelvis forward to arch back to sit up tall, shoulders back not up. Breathe normally  Do throughout day!  Neck Rotation    Begin with chin level, head centered over spine. Slowly turn head to look over shoulder, keeping head centered, shoulders and torso stationary. Hold __10__ seconds. Slowly return to starting position. Repeat to other side. Repeat __3-5__ times to each side. Do 1-2 times per day  Head Tilt    Bring one ear as close as possible to same shoulder. Hold __10__ seconds while counting out loud. Return to center. Repeat to other side. Repeat _3-5___ times. Do __1-2__ sessions per day.   Scapular Retraction: Bilateral    Facing anchor, pull arms back, bringing shoulder blades together. Repeat _10___ times per set. Do _1-2___ sessions per day.    Extension: Isometric With Shoulder Extension (Double Pulley)    Facing door, both arms reaching out in front at 45* angle. Pull arms straight back to hip, keeping head still. Repeat _10___ times per set.  Do _1-2___ sessions per day   Lay with towel roll along spine vertically, hands down by side (palms up) for 10 minutes at least once a day

## 2023-11-08 ENCOUNTER — Ambulatory Visit: Attending: Nurse Practitioner | Admitting: Occupational Therapy

## 2023-12-09 ENCOUNTER — Telehealth: Admitting: Nurse Practitioner

## 2023-12-09 DIAGNOSIS — B001 Herpesviral vesicular dermatitis: Secondary | ICD-10-CM

## 2023-12-09 MED ORDER — VALACYCLOVIR HCL 1 G PO TABS: 2000.0000 mg | ORAL_TABLET | Freq: Two times a day (BID) | ORAL | 1 refills | Status: AC

## 2023-12-09 NOTE — Progress Notes (Signed)

## 2023-12-09 NOTE — Progress Notes (Signed)
 I have spent 5 minutes in review of e-visit questionnaire, review and updating patient chart, medical decision making and response to patient.   Claiborne Rigg, NP

## 2024-04-03 ENCOUNTER — Telehealth: Admitting: Emergency Medicine

## 2024-04-03 DIAGNOSIS — J069 Acute upper respiratory infection, unspecified: Secondary | ICD-10-CM | POA: Diagnosis not present

## 2024-04-03 MED ORDER — BENZONATATE 100 MG PO CAPS: 100.0000 mg | ORAL_CAPSULE | Freq: Three times a day (TID) | ORAL | 0 refills | Status: AC | PRN

## 2024-04-03 MED ORDER — LIDOCAINE VISCOUS HCL 2 % MT SOLN: 15.0000 mL | OROMUCOSAL | 0 refills | Status: AC | PRN

## 2024-04-03 MED ORDER — AZELASTINE HCL 0.1 % NA SOLN: 2.0000 | Freq: Two times a day (BID) | NASAL | 0 refills | Status: AC

## 2024-04-03 NOTE — Progress Notes (Signed)
 We are sorry you are not feeling well.  Here is how we plan to help!  Based on what you have shared with me, it looks like you may have a viral upper respiratory infection.  Upper respiratory infections are caused by a large number of viruses; however, rhinovirus is the most common cause. COVID is also a possibility and I recommend you test yourself for COVID at home.    Symptoms vary from person to person, with common symptoms including sore throat, cough, and fatigue or lack of energy, and a feeling of general discomfort.  A low-grade fever of up to 100.4 may present, but is often uncommon.  Symptoms vary however, and are closely related to a person's age or underlying illnesses.  The most common symptoms associated with an upper respiratory infection are nasal discharge or congestion, cough, sneezing, headache and pressure in the ears and face.  These symptoms usually persist for about 3 to 10 days, but can last up to 2 weeks.  It is important to know that upper respiratory infections do not cause serious illness or complications in most cases.    Upper respiratory infections can be transmitted from person to person, with the most common method of transmission being a person's hands.  The virus is able to live on the skin and can infect other persons for up to 2 hours after direct contact.  Also, these can be transmitted when someone coughs or sneezes; thus, it is important to cover the mouth to reduce this risk.  To keep the spread of the illness at bay, good hand hygiene is very important!  Because this is a viral infection, there are no specific treatments other than to help you with the symptoms until the infection runs its course.    For nasal congestion, you may use an oral decongestants such as Mucinex  D or if you have glaucoma or high blood pressure use plain Mucinex .  Saline nasal spray or nasal drops can help and can safely be used as often as needed for congestion.  For your congestion, I  have prescribed Azelastine  nasal spray two sprays in each nostril twice a day  If you do not have a history of heart disease, hypertension, diabetes or thyroid disease, prostate/bladder issues or glaucoma, you may also use Sudafed to treat nasal congestion.  It is highly recommended that you consult with a pharmacist or your primary care physician to ensure this medication is safe for you to take.     If you have a cough, you may use over-the-counter cough suppressants such as Delsym and Robitussin.  If you have glaucoma or high blood pressure, you can also use Coricidin HBP.   For cough I have prescribed for you A prescription cough medication called Tessalon  Perles 100 mg. You may take 1-2 capsules every 8 hours as needed for cough  If you have a sore or scratchy throat, use a saltwater gargle-  to  teaspoon of salt dissolved in a 4-ounce to 8-ounce glass of warm water.  Gargle the solution for approximately 15-30 seconds and then spit.  It is important not to swallow the solution.  You can also use throat lozenges/cough drops and Chloraseptic spray to help with throat pain or discomfort.  Warm or cold liquids can also be helpful in relieving throat pain. I have also prescribed I have prescribed a Viscous Lidocaine  2% solution. Swallow 5-10 mL every 4-6 hours as needed for sore throat. DO NOT eat or drink anything for  15-20 minutes after swallowing to allow the medication to coat the throat.  For headache, pain, or general discomfort, you can use Ibuprofen  or Tylenol  as directed.   Some authorities believe that zinc sprays or the use of Echinacea may shorten the course of your symptoms.   HOME CARE Only take medications as instructed by your medical team. Be sure to drink plenty of fluids. Water is fine as well as fruit juices, sodas and electrolyte beverages. You may want to stay away from caffeine  or alcohol. If you are nauseated, try taking small sips of liquids. How do you know if you are  getting enough fluid? Your urine should be a pale yellow or almost colorless. Get rest. Taking a steamy shower or using a humidifier may help nasal congestion and ease sore throat pain. You can place a towel over your head and breathe in the steam from hot water coming from a faucet. Using a saline nasal spray works much the same way. Cough drops, hard candies and sore throat lozenges may ease your cough. Avoid close contacts especially the very young and the elderly Cover your mouth if you cough or sneeze Always remember to wash your hands.   GET HELP RIGHT AWAY IF: You develop worsening fever. If your symptoms do not improve within 10 days You develop yellow or green discharge from your nose over 3 days. You have coughing fits You develop a severe head ache or visual changes. You develop shortness of breath, difficulty breathing or start having chest pain Your symptoms persist after you have completed your treatment plan  MAKE SURE YOU  Understand these instructions. Will watch your condition. Will get help right away if you are not doing well or get worse.  Your e-visit answers were reviewed by a board certified advanced clinical practitioner to complete your personal care plan. Depending upon the condition, your plan could have included both over-the-counter or prescription medications.  Please review your pharmacy choice. If there is a problem, you may call our nursing hot line at and have the prescription routed to another pharmacy. Your safety is important to us . If you have drug allergies, check your prescription carefully.   You can use MyChart to ask questions about todays visit, request a non-urgent call back, or ask for a work or school excuse for 24 hours related to this e-Visit. If it has been greater than 24 hours you will need to follow up with your provider, or enter a new e-Visit to address those concerns. You will get an e-mail in the next two days asking about your  experience.  I hope that your e-visit has been valuable and will speed your recovery. Thank you for using e-visits.  I have spent 5 minutes in review of e-visit questionnaire, review and updating patient chart, medical decision making and response to patient.   Jon Belt, PhD, FNP-BC

## 2024-04-09 ENCOUNTER — Telehealth: Admitting: Physician Assistant

## 2024-04-09 DIAGNOSIS — K047 Periapical abscess without sinus: Secondary | ICD-10-CM

## 2024-04-09 DIAGNOSIS — K029 Dental caries, unspecified: Secondary | ICD-10-CM

## 2024-04-09 DIAGNOSIS — S025XXA Fracture of tooth (traumatic), initial encounter for closed fracture: Secondary | ICD-10-CM

## 2024-04-09 MED ORDER — IBUPROFEN 600 MG PO TABS: 600.0000 mg | ORAL_TABLET | Freq: Three times a day (TID) | ORAL | 0 refills | Status: AC | PRN

## 2024-04-09 MED ORDER — AMOXICILLIN-POT CLAVULANATE 875-125 MG PO TABS: 1.0000 | ORAL_TABLET | Freq: Two times a day (BID) | ORAL | 0 refills | Status: AC

## 2024-04-09 NOTE — Progress Notes (Signed)
 E-Visit for Dental Pain  We are sorry that you are not feeling well.  Here is how we plan to help!  Based on what you have shared with me in the questionnaire, it sounds like you have a dental infection with a broken tooth and tooth decay.  I have prescribed: Augmentin  875-125mg  twice a day for 7 days  It is imperative that you see a dentist within 10 days of this eVisit to determine the cause of the dental pain and be sure it is adequately treated  A toothache or tooth pain is caused when the nerve in the root of a tooth or surrounding a tooth is irritated. Dental (tooth) infection, decay, injury, or loss of a tooth are the most common causes of dental pain. Pain may also occur after an extraction (tooth is pulled out). Pain sometimes originates from other areas and radiates to the jaw, thus appearing to be tooth pain.Bacteria growing inside your mouth can contribute to gum disease and dental decay, both of which can cause pain. A toothache occurs from inflammation of the central portion of the tooth called pulp. The pulp contains nerve endings that are very sensitive to pain. Inflammation to the pulp or pulpitis may be caused by dental cavities, trauma, and infection.    HOME CARE:   For toothaches: Over-the-counter pain medications such as acetaminophen  or ibuprofen  may be used. Take these as directed on the package while you arrange for a dental appointment. Avoid very cold or hot foods, because they may make the pain worse. You may get relief from biting on a cotton ball soaked in oil of cloves. You can get oil of cloves at most drug stores.  For jaw pain:  Aspirin  may be helpful for problems in the joint of the jaw in adults. If pain happens every time you open your mouth widely, the temporomandibular joint (TMJ) may be the source of the pain. Yawning or taking a large bite of food may worsen the pain. An appointment with your doctor or dentist will help you find the cause.     GET  HELP RIGHT AWAY IF:  You have a high fever or chills If you have had a recent head or face injury and develop headache, light headedness, nausea, vomiting, or other symptoms that concern you after an injury to your face or mouth, you could have a more serious injury in addition to your dental injury. A facial rash associated with a toothache: This condition may improve with medication. Contact your doctor for them to decide what is appropriate. Any jaw pain occurring with chest pain: Although jaw pain is most commonly caused by dental disease, it is sometimes referred pain from other areas. People with heart disease, especially people who have had stents placed, people with diabetes, or those who have had heart surgery may have jaw pain as a symptom of heart attack or angina. If your jaw or tooth pain is associated with lightheadedness, sweating, or shortness of breath, you should see a doctor as soon as possible. Trouble swallowing or excessive pain or bleeding from gums: If you have a history of a weakened immune system, diabetes, or steroid use, you may be more susceptible to infections. Infections can often be more severe and extensive or caused by unusual organisms. Dental and gum infections in people with these conditions may require more aggressive treatment. An abscess may need draining or IV antibiotics, for example.  MAKE SURE YOU   Understand these instructions. Will watch  your condition. Will get help right away if you are not doing well or get worse.  Thank you for choosing an e-visit.  Your e-visit answers were reviewed by a board certified advanced clinical practitioner to complete your personal care plan. Depending upon the condition, your plan could have included both over the counter or prescription medications.  Please review your pharmacy choice. Make sure the pharmacy is open so you can pick up prescription now. If there is a problem, you may contact your provider through  [employer] and have the prescription routed to another pharmacy.  Your safety is important to us . If you have drug allergies check your prescription carefully.   For the next 24 hours you can use MyChart to ask questions about today's visit, request a non-urgent call back, or ask for a work or school excuse. You will get an email in the next two days asking about your experience. I hope that your e-visit has been valuable and will speed your recovery.  I have spent 5 minutes in review of e-visit questionnaire, review and updating patient chart, medical decision making and response to patient.   Delon CHRISTELLA Dickinson, PA-C

## 2024-04-09 NOTE — Addendum Note (Signed)
 Addended by: VIVIENNE FORTUNATO HERO on: 04/09/2024 09:58 AM   Modules accepted: Orders
